# Patient Record
Sex: Male | Born: 1980
Health system: Southern US, Community
[De-identification: ages and names within clinical notes are randomized; demographics above are authoritative.]

## PROBLEM LIST (undated history)

## (undated) DIAGNOSIS — E119 Type 2 diabetes mellitus without complications: Secondary | ICD-10-CM

## (undated) DIAGNOSIS — F319 Bipolar disorder, unspecified: Secondary | ICD-10-CM

## (undated) DIAGNOSIS — F1010 Alcohol abuse, uncomplicated: Secondary | ICD-10-CM

## (undated) DIAGNOSIS — B1920 Unspecified viral hepatitis C without hepatic coma: Secondary | ICD-10-CM

## (undated) DIAGNOSIS — F1110 Opioid abuse, uncomplicated: Secondary | ICD-10-CM

## (undated) DIAGNOSIS — I10 Essential (primary) hypertension: Secondary | ICD-10-CM

## (undated) HISTORY — DX: Type 2 diabetes mellitus without complications: E11.9

## (undated) HISTORY — PX: KNEE ARTHROSCOPY: SUR90

## (undated) HISTORY — DX: Essential (primary) hypertension: I10

## (undated) HISTORY — DX: Bipolar disorder, unspecified: F31.9

---

## 2013-07-26 ENCOUNTER — Ambulatory Visit (HOSPITAL_BASED_OUTPATIENT_CLINIC_OR_DEPARTMENT_OTHER): Payer: Self-pay | Attending: Internal Medicine

## 2013-07-26 VITALS — Ht 71.0 in | Wt 319.0 lb

## 2013-07-26 DIAGNOSIS — R0609 Other forms of dyspnea: Secondary | ICD-10-CM | POA: Insufficient documentation

## 2013-07-26 DIAGNOSIS — R0989 Other specified symptoms and signs involving the circulatory and respiratory systems: Secondary | ICD-10-CM | POA: Insufficient documentation

## 2013-07-26 DIAGNOSIS — G4733 Obstructive sleep apnea (adult) (pediatric): Secondary | ICD-10-CM | POA: Insufficient documentation

## 2013-07-26 DIAGNOSIS — F40298 Other specified phobia: Secondary | ICD-10-CM | POA: Insufficient documentation

## 2013-08-01 DIAGNOSIS — G4733 Obstructive sleep apnea (adult) (pediatric): Secondary | ICD-10-CM

## 2013-08-01 NOTE — Sleep Study (Signed)
   NAME: Jason Wolf DATE OF BIRTH:  12/25/1980 MEDICAL RECORD NUMBER [id number]  LOCATION: [facility]  PHYSICIAN: Dekisha Mesmer D  DATE OF STUDY: 07/26/2013  SLEEP STUDY TYPE: Nocturnal Polysomnogram               REFERRING PHYSICIAN: Dartha LodgeSteele, Anthony, FNP  INDICATION FOR STUDY: Insomnia with sleep apnea  EPWORTH SLEEPINESS SCORE:   8/24 HEIGHT: 5\' 11"  (180.3 cm)  WEIGHT: 319 lb (144.697 kg)    Body mass index is 44.51 kg/(m^2).  NECK SIZE: 19 in.  MEDICATIONS: Charted for review  SLEEP ARCHITECTURE: Split study protocol. During the diagnostic phase total sleep time 120.5 minutes with sleep efficiency 88.9%. Stage I was 15.8%, stage II 83.4%, stage III 0.8%, REM absent. Sleep latency 12 minutes, awake after sleep onset 3.5 minutes, arousal index 5. Bedtime medication: None  RESPIRATORY DATA: Split study protocol. Apnea hypopnea index (AHI) 17.9 per hour. A total of 36 events were scored including one central apnea and 35 hypopneas. Events were more common while supine. CPAP was titrated to 10 CWP, AHI 8.6 per hour. The patient was described as extremely claustrophobic. Several masks were tried but he settled on a nasal mask. He entered REM in the last hour of sleep and had more events in that interval. Titration may be incomplete.  OXYGEN DATA: Moderately loud snoring before CPAP with oxygen desaturation to a nadir of 83% on room air. With CPAP, snoring was largely controlled and mean oxygen saturation held 93% on room air.  CARDIAC DATA: Normal cardiac rhythm  MOVEMENT/PARASOMNIA: A few incidental limb jerks were noted with little effect on sleep. No bathroom trips.  IMPRESSION/ RECOMMENDATION:   1) Moderate obstructive sleep apnea/hypopnea syndrome, AHI 17.9 per hour. Moderately loud snoring with oxygen desaturation before CPAP to a nadir of 83% on room air. 2) CPAP titration to 10 CWP, AHI 8.6 per hour. Because of claustrophobia, several masks were tried  before settling on a ResMed air fit N10 nasal mask with heated humidifier. Residual events were noted when he entered REM in the last hour of sleep. Suggest initial home trial at 11 CWP. Snoring was largely prevented with CPAP and mean oxygen saturation held 93% on room air.   Signed Jetty Duhamellinton Eugene Zeiders M.D. Waymon BudgeYOUNG,Emmelia Holdsworth D Diplomate, American Board of Sleep Medicine  ELECTRONICALLY SIGNED ON:  08/01/2013, 9:18 AM [facility] PH: [phone]   FX: [phone] ACCREDITED BY THE AMERICAN ACADEMY OF SLEEP MEDICINE

## 2014-11-10 ENCOUNTER — Emergency Department
Admit: 2014-11-10 | Disposition: A | Discharge status: Home / Self Care | Payer: PRIVATE HEALTH INSURANCE | Admitting: Emergency Medicine

## 2014-11-10 LAB — BASIC METABOLIC PANEL
Anion Gap: 9 (ref 7–16)
BUN: 11 mg/dL
CALCIUM: 9.3 mg/dL
Chloride: 102 mmol/L
Co2: 26 mmol/L
Creatinine: 0.75 mg/dL
EGFR (African American): >60
EGFR (Non-African Amer.): >60
Glucose: 78 mg/dL
POTASSIUM: 4.2 mmol/L
SODIUM: 137 mmol/L

## 2014-11-10 LAB — URINALYSIS, COMPLETE
BILIRUBIN, UR: NEGATIVE
BLOOD: NEGATIVE
Bacteria: NONE SEEN
Glucose,UR: NEGATIVE mg/dL (ref 0–75)
Ketone: NEGATIVE
Leukocyte Esterase: NEGATIVE
Nitrite: NEGATIVE
PROTEIN: NEGATIVE
Ph: 6 (ref 4.5–8.0)
Specific Gravity: 1.011 (ref 1.003–1.030)
Squamous Epithelial: NONE SEEN

## 2014-11-10 LAB — CBC
HCT: 41.5 % (ref 40.0–52.0)
HGB: 14.3 g/dL (ref 13.0–18.0)
MCH: 29.6 pg (ref 26.0–34.0)
MCHC: 34.4 g/dL (ref 32.0–36.0)
MCV: 86 fL (ref 80–100)
Platelet: 361 10*3/uL (ref 150–440)
RBC: 4.81 10*6/uL (ref 4.40–5.90)
RDW: 12.6 % (ref 11.5–14.5)
WBC: 8.3 10*3/uL (ref 3.8–10.6)

## 2014-11-10 LAB — HEMOGLOBIN A1C: Hemoglobin A1C: 6.3 % — ABNORMAL HIGH

## 2014-12-09 ENCOUNTER — Ambulatory Visit: Payer: Self-pay

## 2014-12-28 ENCOUNTER — Ambulatory Visit
Admission: RE | Admit: 2014-12-28 | Discharge: 2014-12-28 | Disposition: A | Discharge status: Home / Self Care | Payer: PRIVATE HEALTH INSURANCE | Source: Ambulatory Visit | Attending: Nurse Practitioner | Admitting: Nurse Practitioner

## 2014-12-28 DIAGNOSIS — R000 Tachycardia, unspecified: Secondary | ICD-10-CM | POA: Insufficient documentation

## 2015-01-06 ENCOUNTER — Other Ambulatory Visit: Payer: Self-pay

## 2015-02-03 ENCOUNTER — Other Ambulatory Visit: Payer: Self-pay

## 2015-03-10 ENCOUNTER — Other Ambulatory Visit: Payer: Self-pay

## 2015-03-17 ENCOUNTER — Ambulatory Visit: Payer: Self-pay

## 2015-06-30 ENCOUNTER — Other Ambulatory Visit: Payer: Self-pay

## 2015-06-30 LAB — LIPID PANEL
CHOLESTEROL: 218 mg/dL — AB (ref 0–200)
HDL: 32 mg/dL — AB (ref 35–70)
TRIGLYCERIDES: 521 mg/dL — AB (ref 40–160)

## 2015-06-30 LAB — CBC AND DIFFERENTIAL
HEMATOCRIT: 42 % (ref 41–53)
Hemoglobin: 14.3 g/dL (ref 13.5–17.5)
Neutrophils Absolute: 46 /uL
Platelets: 317 10*3/uL (ref 150–399)
WBC: 7.8 10^3/mL

## 2015-06-30 LAB — HEPATIC FUNCTION PANEL
ALT: 31 U/L (ref 10–40)
AST: 19 U/L (ref 14–40)
Alkaline Phosphatase: 67 U/L (ref 25–125)
Bilirubin, Total: 0.4 mg/dL

## 2015-06-30 LAB — BASIC METABOLIC PANEL
BUN: 6 mg/dL (ref 4–21)
CREATININE: 0.6 mg/dL (ref 0.6–1.3)
GLUCOSE: 229 mg/dL
POTASSIUM: 4.8 mmol/L (ref 3.4–5.3)
SODIUM: 134 mmol/L — AB (ref 137–147)

## 2015-06-30 LAB — HEMOGLOBIN A1C: Hemoglobin A1C: 10.1

## 2015-07-07 ENCOUNTER — Ambulatory Visit: Payer: Self-pay

## 2015-07-07 DIAGNOSIS — E119 Type 2 diabetes mellitus without complications: Secondary | ICD-10-CM | POA: Insufficient documentation

## 2015-07-07 DIAGNOSIS — I10 Essential (primary) hypertension: Secondary | ICD-10-CM | POA: Insufficient documentation

## 2015-07-07 DIAGNOSIS — E785 Hyperlipidemia, unspecified: Secondary | ICD-10-CM | POA: Insufficient documentation

## 2015-07-07 DIAGNOSIS — F319 Bipolar disorder, unspecified: Secondary | ICD-10-CM | POA: Insufficient documentation

## 2015-09-16 DIAGNOSIS — E119 Type 2 diabetes mellitus without complications: Secondary | ICD-10-CM

## 2015-09-16 DIAGNOSIS — I10 Essential (primary) hypertension: Secondary | ICD-10-CM

## 2015-09-16 DIAGNOSIS — F3170 Bipolar disorder, currently in remission, most recent episode unspecified: Secondary | ICD-10-CM

## 2015-09-16 DIAGNOSIS — E785 Hyperlipidemia, unspecified: Secondary | ICD-10-CM

## 2015-10-13 ENCOUNTER — Ambulatory Visit: Payer: Self-pay

## 2015-10-28 ENCOUNTER — Other Ambulatory Visit: Payer: Self-pay | Admitting: Internal Medicine

## 2016-01-19 ENCOUNTER — Encounter (HOSPITAL_BASED_OUTPATIENT_CLINIC_OR_DEPARTMENT_OTHER): Payer: Self-pay

## 2016-01-19 ENCOUNTER — Emergency Department (HOSPITAL_BASED_OUTPATIENT_CLINIC_OR_DEPARTMENT_OTHER)
Admission: EM | Admit: 2016-01-19 | Discharge: 2016-01-19 | Disposition: A | Discharge status: Home / Self Care | Payer: PRIVATE HEALTH INSURANCE | Attending: Emergency Medicine | Admitting: Emergency Medicine

## 2016-01-19 DIAGNOSIS — Y939 Activity, unspecified: Secondary | ICD-10-CM | POA: Insufficient documentation

## 2016-01-19 DIAGNOSIS — Z7984 Long term (current) use of oral hypoglycemic drugs: Secondary | ICD-10-CM | POA: Insufficient documentation

## 2016-01-19 DIAGNOSIS — E119 Type 2 diabetes mellitus without complications: Secondary | ICD-10-CM | POA: Insufficient documentation

## 2016-01-19 DIAGNOSIS — Y929 Unspecified place or not applicable: Secondary | ICD-10-CM | POA: Insufficient documentation

## 2016-01-19 DIAGNOSIS — Z79899 Other long term (current) drug therapy: Secondary | ICD-10-CM | POA: Insufficient documentation

## 2016-01-19 DIAGNOSIS — F319 Bipolar disorder, unspecified: Secondary | ICD-10-CM | POA: Insufficient documentation

## 2016-01-19 DIAGNOSIS — I10 Essential (primary) hypertension: Secondary | ICD-10-CM | POA: Insufficient documentation

## 2016-01-19 DIAGNOSIS — Y999 Unspecified external cause status: Secondary | ICD-10-CM | POA: Insufficient documentation

## 2016-01-19 DIAGNOSIS — S40269A Insect bite (nonvenomous) of unspecified shoulder, initial encounter: Secondary | ICD-10-CM | POA: Insufficient documentation

## 2016-01-19 DIAGNOSIS — S80869A Insect bite (nonvenomous), unspecified lower leg, initial encounter: Secondary | ICD-10-CM | POA: Insufficient documentation

## 2016-01-19 DIAGNOSIS — W57XXXA Bitten or stung by nonvenomous insect and other nonvenomous arthropods, initial encounter: Secondary | ICD-10-CM | POA: Insufficient documentation

## 2016-01-19 DIAGNOSIS — F17200 Nicotine dependence, unspecified, uncomplicated: Secondary | ICD-10-CM | POA: Insufficient documentation

## 2016-01-19 HISTORY — DX: Alcohol abuse, uncomplicated: F10.10

## 2016-01-19 HISTORY — DX: Opioid abuse, uncomplicated: F11.10

## 2016-01-19 NOTE — ED Notes (Signed)
Scattered rash x 1-2 weeks-[facility] days for ETOH and opiate abuse-NAD-steady gait

## 2016-01-19 NOTE — ED Provider Notes (Signed)
CSN: [id number]     Arrival date & time 01/19/16  1649 History   First MD Initiated Contact with Patient 01/19/16 1701     Chief Complaint  Patient presents with  . Rash     (Consider location/radiation/quality/duration/timing/severity/associated sxs/prior Treatment) Patient is a 35 y.o. male presenting with rash. The history is provided by the patient.  Rash Location:  Shoulder/arm and leg Quality: itchiness, redness and weeping   Severity:  Moderate Onset quality:  Gradual Duration:  2 weeks Timing:  Constant Progression:  Worsening Chronicity:  New Relieved by:  Nothing Worsened by:  Nothing tried Ineffective treatments:  None tried Associated symptoms: no abdominal pain, no diarrhea, no fever, no headaches, no joint pain, no myalgias, no shortness of breath and not vomiting     35 yo M With a chief complaint of a rash. Is localized to the extremities. Rash is itchy. Some areas are now more like welts with some clear discharge. These are the ones that he said were extremely itchy that he would scratch. Denies fevers or chills.  Past Medical History  Diagnosis Date  . Hypertension   . Diabetes mellitus without complication (HCC)   . Bipolar disorder (HCC)   . ETOH abuse   . Opiate abuse, episodic    Past Surgical History  Procedure Laterality Date  . Knee arthroscopy     Family History  Problem Relation Age of Onset  . Diabetes type II Mother    Social History  Substance Use Topics  . Smoking status: Current Every Day Smoker -- 1.00 packs/day  . Smokeless tobacco: None  . Alcohol Use: No     Comment: in rehab    Review of Systems  Constitutional: Negative for fever and chills.  HENT: Negative for congestion and facial swelling.   Eyes: Negative for discharge and visual disturbance.  Respiratory: Negative for shortness of breath.   Cardiovascular: Negative for chest pain and palpitations.  Gastrointestinal: Negative for vomiting, abdominal pain and diarrhea.   Musculoskeletal: Negative for myalgias and arthralgias.  Skin: Positive for rash. Negative for color change.  Neurological: Negative for tremors, syncope and headaches.  Psychiatric/Behavioral: Negative for confusion and dysphoric mood.      Allergies  Codeine  Home Medications   Prior to Admission medications   Medication Sig Start Date End Date Taking? Authorizing Provider  citalopram (CELEXA) 40 MG tablet Take 40 mg by mouth daily.    Historical Provider, MD  divalproex (DEPAKOTE) 500 MG DR tablet Take 500 mg by mouth 3 (three) times daily.    Historical Provider, MD  fenofibrate (TRICOR) 145 MG tablet TAKE ONE TABLET BY MOUTH EVERY DAY 11/01/15   Harle BattiestShannon A McGowan, PA-C  hydrochlorothiazide (MICROZIDE) 12.5 MG capsule Take 12.5 mg by mouth daily.    Historical Provider, MD  lisinopril (PRINIVIL,ZESTRIL) 20 MG tablet Take 20 mg by mouth daily.    Historical Provider, MD  metFORMIN (GLUCOPHAGE) 500 MG tablet Take by mouth 2 (two) times daily with a meal.    Historical Provider, MD  naproxen (NAPROSYN) 250 MG tablet Take by mouth as needed.    Historical Provider, MD  rosuvastatin (CRESTOR) 5 MG tablet Take 5 mg by mouth daily.    Historical Provider, MD   BP 119/81 mmHg  Pulse 99  Temp(Src) 98.7 F (37.1 C)  Resp 18  Ht 5\' 11"  (1.803 m)  Wt 308 lb (139.708 kg)  BMI 42.98 kg/m2  SpO2 99% Physical Exam  Constitutional: He is oriented to  person, place, and time. He appears well-developed and well-nourished.  HENT:  Head: Normocephalic and atraumatic.  Eyes: EOM are normal. Pupils are equal, round, and reactive to light.  Neck: Normal range of motion. Neck supple. No JVD present.  Cardiovascular: Normal rate and regular rhythm.  Exam reveals no gallop and no friction rub.   No murmur heard. Pulmonary/Chest: No respiratory distress. He has no wheezes.  Abdominal: He exhibits no distension. There is no tenderness. There is no rebound and no guarding.  Musculoskeletal: Normal  range of motion.  Neurological: He is alert and oriented to person, place, and time.  Skin: Rash noted. No pallor.  Multiple well circumscribed lesions, pruritic with excoriations. Localized to the extremities.  Psychiatric: He has a normal mood and affect. His behavior is normal.  Nursing note and vitals reviewed.   ED Course  Procedures (including critical care time) Labs Review Labs Reviewed - No data to display  Imaging Review No results found. I have personally reviewed and evaluated these images and lab results as part of my medical decision-making.   EKG Interpretation None      MDM   Final diagnoses:  Insect bite    35 yo M with a chief complaint of an itchy rash. Appear to be insect bites based on location.We'll have him use Benadryl as needed for itching. PCP follow-up.  5:20 PM:  I have discussed the diagnosis/risks/treatment options with the patient and believe the pt to be eligible for discharge home to follow-up with PCP. We also discussed returning to the ED immediately if new or worsening sx occur. We discussed the sx which are most concerning (e.g., sudden worsening pain, fever, inability to tolerate by mouth) that necessitate immediate return. Medications administered to the patient during their visit and any new prescriptions provided to the patient are listed below.  Medications given during this visit Medications - No data to display  New Prescriptions   No medications on file    The patient appears reasonably screen and/or stabilized for discharge and I doubt any other medical condition or other [facility] requiring further screening, evaluation, or treatment in the ED at this time prior to discharge.      Melene Planan Gorge Almanza, DO 01/19/16 1720

## 2016-02-05 ENCOUNTER — Encounter (HOSPITAL_COMMUNITY): Payer: Self-pay | Admitting: *Deleted

## 2016-02-05 ENCOUNTER — Emergency Department (HOSPITAL_COMMUNITY)
Admission: EM | Admit: 2016-02-05 | Discharge: 2016-02-06 | Disposition: A | Discharge status: Home / Self Care | Payer: Self-pay | Attending: Emergency Medicine | Admitting: Emergency Medicine

## 2016-02-05 DIAGNOSIS — F3181 Bipolar II disorder: Secondary | ICD-10-CM | POA: Insufficient documentation

## 2016-02-05 DIAGNOSIS — Z7984 Long term (current) use of oral hypoglycemic drugs: Secondary | ICD-10-CM | POA: Insufficient documentation

## 2016-02-05 DIAGNOSIS — R45851 Suicidal ideations: Secondary | ICD-10-CM

## 2016-02-05 DIAGNOSIS — Z791 Long term (current) use of non-steroidal anti-inflammatories (NSAID): Secondary | ICD-10-CM | POA: Insufficient documentation

## 2016-02-05 DIAGNOSIS — F17200 Nicotine dependence, unspecified, uncomplicated: Secondary | ICD-10-CM | POA: Insufficient documentation

## 2016-02-05 DIAGNOSIS — E119 Type 2 diabetes mellitus without complications: Secondary | ICD-10-CM | POA: Insufficient documentation

## 2016-02-05 DIAGNOSIS — I10 Essential (primary) hypertension: Secondary | ICD-10-CM | POA: Insufficient documentation

## 2016-02-05 DIAGNOSIS — F1110 Opioid abuse, uncomplicated: Secondary | ICD-10-CM | POA: Insufficient documentation

## 2016-02-05 DIAGNOSIS — Z79899 Other long term (current) drug therapy: Secondary | ICD-10-CM | POA: Insufficient documentation

## 2016-02-05 LAB — COMPREHENSIVE METABOLIC PANEL
ALBUMIN: 4.2 g/dL (ref 3.5–5.0)
ALT: 36 U/L (ref 17–63)
ANION GAP: 9 (ref 5–15)
AST: 25 U/L (ref 15–41)
Alkaline Phosphatase: 45 U/L (ref 38–126)
BUN: 10 mg/dL (ref 6–20)
CHLORIDE: 103 mmol/L (ref 101–111)
CO2: 24 mmol/L (ref 22–32)
Calcium: 9 mg/dL (ref 8.9–10.3)
Creatinine, Ser: 0.7 mg/dL (ref 0.61–1.24)
GFR calc non Af Amer: >60 mL/min (ref >60)
GLUCOSE: 151 mg/dL — AB (ref 65–99)
POTASSIUM: 4.1 mmol/L (ref 3.5–5.1)
SODIUM: 136 mmol/L (ref 135–145)
Total Bilirubin: 0.8 mg/dL (ref 0.3–1.2)
Total Protein: 7 g/dL (ref 6.5–8.1)

## 2016-02-05 LAB — CBC
HCT: 36.9 % — ABNORMAL LOW (ref 39.0–52.0)
HEMOGLOBIN: 13.2 g/dL (ref 13.0–17.0)
MCH: 30.7 pg (ref 26.0–34.0)
MCHC: 35.8 g/dL (ref 30.0–36.0)
MCV: 85.8 fL (ref 78.0–100.0)
Platelets: 363 10*3/uL (ref 150–400)
RBC: 4.3 MIL/uL (ref 4.22–5.81)
RDW: 12.1 % (ref 11.5–15.5)
WBC: 6.1 10*3/uL (ref 4.0–10.5)

## 2016-02-05 LAB — RAPID URINE DRUG SCREEN, HOSP PERFORMED
AMPHETAMINES: NOT DETECTED
BENZODIAZEPINES: NOT DETECTED
Barbiturates: NOT DETECTED
COCAINE: NOT DETECTED
OPIATES: NOT DETECTED
TETRAHYDROCANNABINOL: NOT DETECTED

## 2016-02-05 LAB — SALICYLATE LEVEL

## 2016-02-05 LAB — ACETAMINOPHEN LEVEL

## 2016-02-05 LAB — ETHANOL: Alcohol, Ethyl (B): <5 mg/dL (ref <5)

## 2016-02-05 MED ORDER — FENOFIBRATE 54 MG PO TABS
54.0000 mg | ORAL_TABLET | Freq: Every day | ORAL | Status: DC
  Administered 2016-02-05 – 2016-02-06 (×2): 54 mg via ORAL
  Filled 2016-02-05 (×2): qty 1

## 2016-02-05 MED ORDER — METFORMIN HCL 500 MG PO TABS
1000.0000 mg | ORAL_TABLET | Freq: Two times a day (BID) | ORAL | Status: DC
  Administered 2016-02-05 – 2016-02-06 (×2): 1000 mg via ORAL
  Filled 2016-02-05 (×3): qty 2

## 2016-02-05 MED ORDER — LISINOPRIL 40 MG PO TABS
40.0000 mg | ORAL_TABLET | Freq: Every day | ORAL | Status: DC
  Administered 2016-02-05 – 2016-02-06 (×2): 40 mg via ORAL
  Filled 2016-02-05 (×2): qty 1

## 2016-02-05 MED ORDER — DIVALPROEX SODIUM 500 MG PO DR TAB
1000.0000 mg | DELAYED_RELEASE_TABLET | Freq: Two times a day (BID) | ORAL | Status: DC
  Administered 2016-02-05 – 2016-02-06 (×2): 1000 mg via ORAL
  Filled 2016-02-05 (×2): qty 2

## 2016-02-05 MED ORDER — DIPHENHYDRAMINE HCL 25 MG PO CAPS: 50.0000 mg | ORAL_CAPSULE | Freq: Four times a day (QID) | ORAL | Status: DC | PRN

## 2016-02-05 MED ORDER — NAPROXEN 500 MG PO TABS
250.0000 mg | ORAL_TABLET | Freq: Two times a day (BID) | ORAL | Status: DC | PRN
  Administered 2016-02-05: 250 mg via ORAL
  Filled 2016-02-05: qty 1

## 2016-02-05 MED ORDER — HYDROCHLOROTHIAZIDE 12.5 MG PO CAPS
12.5000 mg | ORAL_CAPSULE | Freq: Every day | ORAL | Status: DC
  Administered 2016-02-05 – 2016-02-06 (×2): 12.5 mg via ORAL
  Filled 2016-02-05 (×2): qty 1

## 2016-02-05 MED ORDER — CITALOPRAM HYDROBROMIDE 10 MG PO TABS
40.0000 mg | ORAL_TABLET | Freq: Every day | ORAL | Status: DC
  Administered 2016-02-05: 40 mg via ORAL
  Filled 2016-02-05: qty 4
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
## 2016-02-05 NOTE — ED Notes (Signed)
Per pt report: pt is coming from [facility] in a drug recovery program from opioids, benzos, OTC cough medicine.  Pt states he has borderline personality disorder and got upset last night and therefore attempted to hang himself.  At this time, pt still reports SI with no plan.  Pt denies any pain or other symptoms.

## 2016-02-05 NOTE — ED Notes (Signed)
Patient noted sleeping in room. No complaints, stable, in no acute distress. Q15 minute rounds and monitoring via Security Cameras to continue.  

## 2016-02-05 NOTE — ED Notes (Signed)
Report from Janie Rambo RN. Patient sleeping, respirations regular and unlabored. Q15 minute rounds and security camera observation to continue.   

## 2016-02-05 NOTE — ED Notes (Signed)
Up to the bathroom 

## 2016-02-05 NOTE — ED Notes (Signed)
Visitor into see 
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
## 2016-02-05 NOTE — ED Notes (Signed)
Pt ambulatory w/o difficulty from TCU 

## 2016-02-05 NOTE — ED Notes (Signed)
[facility] Expected date:  Expected time:  Means of arrival:  Comments: Hold 29

## 2016-02-05 NOTE — Progress Notes (Signed)
Disposition CSW completed patient referrals to the following inpatient psych facilities:  [facility]  CSW will continue to follow patient for placement needs.  Seward SpeckLeo Josephanthony Wolf [facility] CenterCSW,LCAS Behavioral Health Disposition CSW [phone]

## 2016-02-06 DIAGNOSIS — F3181 Bipolar II disorder: Secondary | ICD-10-CM | POA: Diagnosis present

## 2016-02-06 NOTE — ED Notes (Signed)
Patient noted sleeping in room. No complaints, stable, in no acute distress. Q15 minute rounds and monitoring via Security Cameras to continue.  

## 2016-02-06 NOTE — BH Assessment (Signed)
BHH Assessment Progress Note  Per Thedore MinsMojeed Akintayo, MD, this pt does not require psychiatric hospitalization at this time.  Pt reports that [facility], is [facility] for him.  At 11:12 I called the facility at [phone] and spoke to [facility], who confirms that this it true, and agrees to have someone come to the ED to pick the pt up.  Dr Jannifer FranklinAkintayo concurs with this plan.  Pt's nurse, Kendal Hymendie, has been notified.  Arrival of [facility] representative is pending as of this writing.  Jason Canninghomas Alaisha Eversley, MA Triage Specialist [phone]

## 2016-02-06 NOTE — ED Notes (Signed)
[facility]  All belongings were returned to patient.  Pt was safe and denied suicidal ideations.
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
## 2016-02-06 NOTE — BHH Suicide Risk Assessment (Signed)
Suicide Risk Assessment  Discharge Assessment   [facility]   Principal Problem: Bipolar 2 disorder, major depressive episode [facility](HCC) Discharge Diagnoses:  Patient Active Problem List   Diagnosis Date Noted  . Bipolar 2 disorder, major depressive episode ([facility]) [F31.81] 02/06/2016    Priority: High  . Diabetes ([facility]) [E11.9] 07/07/2015  . Bipolar disorder ([facility]) [F31.9] 07/07/2015  . Essential hypertension [I10] 07/07/2015  . Hyperlipemia [E78.5] 07/07/2015    Total Time spent with patient: 45 minutes  Musculoskeletal: Strength & Muscle Tone: within normal limits Gait & Station: normal Patient leans: N/A  Psychiatric Specialty Exam: Physical Exam  Constitutional: He is oriented to person, place, and time. He appears well-developed and well-nourished.  HENT:  Head: Normocephalic.  Neck: Normal range of motion.  Respiratory: Effort normal.  Musculoskeletal: Normal range of motion.  Neurological: He is alert and oriented to person, place, and time.  Skin: Skin is warm and dry.  Psychiatric: He has a normal mood and affect. His speech is normal and behavior is normal. Judgment and thought content normal. Cognition and memory are normal.    Review of Systems  Constitutional: Negative.   HENT: Negative.   Eyes: Negative.   Respiratory: Negative.   Cardiovascular: Negative.   Gastrointestinal: Negative.   Genitourinary: Negative.   Musculoskeletal: Negative.   Skin: Negative.   Neurological: Negative.   Endo/Heme/Allergies: Negative.   Psychiatric/Behavioral: Positive for substance abuse.    Blood pressure 109/73, pulse 71, temperature 97.9 F (36.6 C), temperature source Oral, resp. rate 16, height 5\' 11"  (1.803 m), weight 139.708 kg (308 lb), SpO2 96 %.Body mass index is 42.98 kg/(m^2).  General Appearance: Casual  Eye Contact:  Good  Speech:  Normal Rate  Volume:  Normal  Mood:  Euthymic  Affect:  Congruent  Thought Process:  Coherent and  Descriptions of Associations: Intact  Orientation:  Full (Time, Place, and Person)  Thought Content:  WDL  Suicidal Thoughts:  No  Homicidal Thoughts:  No  Memory:  Immediate;   Good Recent;   Good Remote;   Good  Judgement:  Fair  Insight:  Fair  Psychomotor Activity:  Normal  Concentration:  Concentration: Good and Attention Span: Good  Recall:  Good  Fund of Knowledge:  Good  Language:  Good  Akathisia:  No  Handed:  Right  AIMS (if indicated):     Assets:  Housing Leisure Time Physical Health Resilience Social Support  ADL's:  Intact  Cognition:  WNL  Sleep:      Mental Status Per Nursing Assessment::   On Admission:   suicide threat  Demographic Factors:  Male and Caucasian  Loss Factors: NA  Historical Factors: NA  Risk Reduction Factors:   Sense of responsibility to family, Living with another person, especially a relative, Positive social support and Positive therapeutic relationship  Continued Clinical Symptoms:  Substance abuse  Cognitive Features That Contribute To Risk:  None    Suicide Risk:  Minimal: No identifiable suicidal ideation.  Patients presenting with no risk factors but with morbid ruminations; may be classified as minimal risk based on the severity of the depressive symptoms    Plan Of Care/Follow-up recommendations:  Activity:  as tolerated  Diet:  heart healthy diet  Amauri Keefe, NP 02/06/2016, 11:28 AM

## 2016-02-06 NOTE — Discharge Instructions (Signed)
For your ongoing behavioral health needs, you are advised to continue treatment at [facility]:       [facility]      [address]      [place], [place] [address]      [phone]

## 2016-04-18 ENCOUNTER — Other Ambulatory Visit: Payer: Self-pay | Admitting: Internal Medicine

## 2019-07-27 ENCOUNTER — Other Ambulatory Visit: Payer: Self-pay

## 2019-07-27 ENCOUNTER — Encounter (HOSPITAL_BASED_OUTPATIENT_CLINIC_OR_DEPARTMENT_OTHER): Payer: Self-pay | Admitting: *Deleted

## 2019-07-27 ENCOUNTER — Emergency Department (HOSPITAL_BASED_OUTPATIENT_CLINIC_OR_DEPARTMENT_OTHER)
Admission: EM | Admit: 2019-07-27 | Discharge: 2019-07-27 | Disposition: A | Discharge status: Home / Self Care | Payer: PRIVATE HEALTH INSURANCE | Attending: Emergency Medicine | Admitting: Emergency Medicine

## 2019-07-27 DIAGNOSIS — Z79899 Other long term (current) drug therapy: Secondary | ICD-10-CM | POA: Insufficient documentation

## 2019-07-27 DIAGNOSIS — F17210 Nicotine dependence, cigarettes, uncomplicated: Secondary | ICD-10-CM | POA: Insufficient documentation

## 2019-07-27 DIAGNOSIS — I10 Essential (primary) hypertension: Secondary | ICD-10-CM | POA: Insufficient documentation

## 2019-07-27 DIAGNOSIS — E119 Type 2 diabetes mellitus without complications: Secondary | ICD-10-CM | POA: Insufficient documentation

## 2019-07-27 DIAGNOSIS — K0889 Other specified disorders of teeth and supporting structures: Secondary | ICD-10-CM | POA: Insufficient documentation

## 2019-07-27 DIAGNOSIS — Z885 Allergy status to narcotic agent status: Secondary | ICD-10-CM | POA: Insufficient documentation

## 2019-07-27 DIAGNOSIS — Z7984 Long term (current) use of oral hypoglycemic drugs: Secondary | ICD-10-CM | POA: Insufficient documentation

## 2019-07-27 MED ORDER — PENICILLIN V POTASSIUM 500 MG PO TABS
500.0000 mg | ORAL_TABLET | Freq: Four times a day (QID) | ORAL | 0 refills | Status: AC
Start: 2019-07-27 — End: 2019-08-03

## 2019-07-27 MED ORDER — NAPROXEN 500 MG PO TABS: 500.0000 mg | ORAL_TABLET | Freq: Two times a day (BID) | ORAL | 0 refills | Status: DC

## 2019-07-27 NOTE — Discharge Instructions (Signed)
Take Penicillin 4 times daily for one week. Take with food Take Naproxen twice daily for one week for pain and inflammation Please follow up with a dentist when possible

## 2019-07-27 NOTE — ED Triage Notes (Signed)
right up dental pain x 1 week.

## 2019-07-27 NOTE — ED Notes (Signed)
Pt. Reports the tooth has been broken and started to bother him approx. A week ago now.  Pt. Reports he has no dentist.  Pt. Is in pain with reports of soreness around the tooth.  A little jaw swelling not much on the R side.

## 2019-07-27 NOTE — ED Provider Notes (Signed)
[facility] EMERGENCY DEPARTMENT Provider Note   CSN: [id number] Arrival date & time: 07/27/19  1525   History Chief Complaint  Patient presents with  . Dental Pain    Jason Wolf is a 38 y.o. male who presents with dental pain.  States that he has been having right upper dental pain for approximately 1-1/2 weeks.  He reports the pain radiates to the right forehead and he has associated swelling of the cheek. He has not had pain in this tooth before. He has previously had to have all of his teeth pulled and has dentures however his 2 top wisdom teeth came in after that and now he cannot wear the dentures.  This was done in [place] at a free clinic. He reports hx of opiate abuse and states he's in recovery. He's been taking Ibuprofen without relief. He does not have a dentist at this time. He denies fever or oral swelling  HPI   Past Medical History:  Diagnosis Date  . Bipolar disorder ([place])   . Diabetes mellitus without complication ([place])   . ETOH abuse   . Hypertension   . Opiate abuse, episodic [facility])     Patient Active Problem List   Diagnosis Date Noted  . Bipolar 2 disorder, major depressive episode ([place]) 02/06/2016  . Diabetes ([place]) 07/07/2015  . Bipolar disorder ([place]) 07/07/2015  . Essential hypertension 07/07/2015  . Hyperlipemia 07/07/2015    Past Surgical History:  Procedure Laterality Date  . KNEE ARTHROSCOPY         Family History  Problem Relation Age of Onset  . Diabetes type II Mother     Social History   Tobacco Use  . Smoking status: Current Every Day Smoker    Packs/day: 1.00  Substance Use Topics  . Alcohol use: No    Comment: in rehab  . Drug use: No    Comment: in rehab-opiate abuse    Home Medications Prior to Admission medications   Medication Sig Start Date End Date Taking? Authorizing Provider  citalopram (CELEXA) 40 MG tablet Take 40 mg by mouth at bedtime.     [provider]  diphenhydrAMINE (BENADRYL) 25  mg capsule Take 50 mg by mouth every 6 (six) hours as needed for itching, allergies or sleep.    [provider]  divalproex (DEPAKOTE) 500 MG DR tablet Take 1,000 mg by mouth 2 (two) times daily.     [provider]  fenofibrate (TRICOR) 145 MG tablet TAKE ONE TABLET BY MOUTH EVERY DAY 11/01/15   McGowan, Larene [place] A, PA-C  hydrochlorothiazide (MICROZIDE) 12.5 MG capsule Take 12.5 mg by mouth daily. Reported on 02/05/2016    [provider]  lisinopril (PRINIVIL,ZESTRIL) 40 MG tablet Take 40 mg by mouth daily.    [provider]  metFORMIN (GLUCOPHAGE) 1000 MG tablet Take 1,000 mg by mouth 2 (two) times daily with a meal.    [provider]  naproxen (NAPROSYN) 250 MG tablet Take 250 mg by mouth 2 (two) times daily as needed for moderate pain or headache.     [provider]    Allergies    Codeine  Review of Systems   Review of Systems  Constitutional: Negative for fever.  HENT: Positive for dental problem and facial swelling.     Physical Exam Updated Vital Signs BP (!) 155/91 (BP Location: Right Arm)   Pulse (!) 111   Temp 98.4 F (36.9 C) (Oral)   Resp 18   Ht  5\' 11"  (1.803 m)   Wt 124.7 kg   SpO2 98%   BMI 38.35 kg/m   Physical Exam Vitals and nursing note reviewed.  Constitutional:      General: He is not in acute distress.    Appearance: He is well-developed. He is obese. He is not ill-appearing.  HENT:     Head: Normocephalic and atraumatic.     Mouth/Throat:     Comments: 2 top wisdom teeth are significantly decayed. The rest of his teeth have been pulled Eyes:     General: No scleral icterus.       Right eye: No discharge.        Left eye: No discharge.     Conjunctiva/sclera: Conjunctivae normal.     Pupils: Pupils are equal, round, and reactive to light.  Cardiovascular:     Rate and Rhythm: Normal rate.  Pulmonary:     Effort: Pulmonary effort is normal. No respiratory distress.  Abdominal:     General:  There is no distension.  Musculoskeletal:     Cervical back: Normal range of motion.  Skin:    General: Skin is warm and dry.  Neurological:     Mental Status: He is alert and oriented to person, place, and time.  Psychiatric:        Behavior: Behavior normal.     ED Results / Procedures / Treatments   Labs (all labs ordered are listed, but only abnormal results are displayed) Labs Reviewed - No data to display  EKG None  Radiology No results found.  Procedures Procedures (including critical care time)  Medications Ordered in ED Medications - No data to display  ED Course  I have reviewed the triage vital signs and the nursing notes.  Pertinent labs & imaging results that were available during my care of the patient were reviewed by me and considered in my medical decision making (see chart for details).  Pt presents with dental pain. Patient is afebrile, non toxic appearing, and swallowing secretions well. No concerning findings on exam. No obvious drainable abscess and doubt deep space head or neck infection. Will tx with antibiotics and NSAIDs. I gave patient referral to dentist and stressed the importance of dental follow up for ultimate management of dental pain. Discussed findings, treatment, and follow up  with patient.  Pt given return precautions.  Pt verbalizes understanding and agrees with plan.   MDM Rules/Calculators/A&P                       Final Clinical Impression(s) / ED Diagnoses Final diagnoses:  Pain, dental    Rx / DC Orders ED Discharge Orders    None       , PA-C 07/27/19 1800    07/29/19, MD 07/29/19 [phone]

## 2019-11-21 ENCOUNTER — Emergency Department (HOSPITAL_BASED_OUTPATIENT_CLINIC_OR_DEPARTMENT_OTHER)
Admission: EM | Admit: 2019-11-21 | Discharge: 2019-11-21 | Disposition: A | Discharge status: Home / Self Care | Payer: PRIVATE HEALTH INSURANCE | Attending: Emergency Medicine | Admitting: Emergency Medicine

## 2019-11-21 ENCOUNTER — Encounter (HOSPITAL_BASED_OUTPATIENT_CLINIC_OR_DEPARTMENT_OTHER): Payer: Self-pay | Admitting: Emergency Medicine

## 2019-11-21 ENCOUNTER — Other Ambulatory Visit: Payer: Self-pay

## 2019-11-21 DIAGNOSIS — E119 Type 2 diabetes mellitus without complications: Secondary | ICD-10-CM | POA: Insufficient documentation

## 2019-11-21 DIAGNOSIS — K0889 Other specified disorders of teeth and supporting structures: Secondary | ICD-10-CM | POA: Insufficient documentation

## 2019-11-21 DIAGNOSIS — I10 Essential (primary) hypertension: Secondary | ICD-10-CM | POA: Insufficient documentation

## 2019-11-21 DIAGNOSIS — Z79899 Other long term (current) drug therapy: Secondary | ICD-10-CM | POA: Insufficient documentation

## 2019-11-21 DIAGNOSIS — K029 Dental caries, unspecified: Secondary | ICD-10-CM | POA: Insufficient documentation

## 2019-11-21 DIAGNOSIS — Z7984 Long term (current) use of oral hypoglycemic drugs: Secondary | ICD-10-CM | POA: Insufficient documentation

## 2019-11-21 DIAGNOSIS — F17210 Nicotine dependence, cigarettes, uncomplicated: Secondary | ICD-10-CM | POA: Insufficient documentation

## 2019-11-21 MED ORDER — PENICILLIN V POTASSIUM 500 MG PO TABS: 500.0000 mg | ORAL_TABLET | Freq: Four times a day (QID) | ORAL | 0 refills | Status: AC

## 2019-11-21 NOTE — Discharge Instructions (Addendum)
You were given a prescription for antibiotics. Please take the antibiotic prescription fully.   Please follow-up with a dentist in the next 5 to 7 days for reevaluation.  If you do not have a dentist, resources were provided for dentist in the area in your discharge summary.  Please contact one of the offices that are listed and make an appointment for follow-up.  Please return to the emergency department for any new or worsening symptoms.  

## 2019-11-21 NOTE — ED Provider Notes (Signed)
[facility] EMERGENCY DEPARTMENT Provider Note   CSN: [id number] Arrival date & time: 11/21/19  1054     History Chief Complaint  Patient presents with  . Dental Pain    Jason Wolf is a 39 y.o. male.  HPI   Patient is a 39 year old male with a history of bipolar disorder, diabetes, EtOH abuse, hypertension, who presents the emergency department today complaining of left upper dental pain.  States he has pain for about 1.5 weeks.  It is constant and severe in nature.  He has tried taking ibuprofen at home without significant relief.  Denies any fevers facial swelling or other symptoms.  States he does have an appoint with a dentist coming up.  Past Medical History:  Diagnosis Date  . Bipolar disorder (HCC)   . Diabetes mellitus without complication (HCC)   . ETOH abuse   . Hypertension   . Opiate abuse, episodic [facility])     Patient Active Problem List   Diagnosis Date Noted  . Bipolar 2 disorder, major depressive episode (HCC) 02/06/2016  . Diabetes (HCC) 07/07/2015  . Bipolar disorder (HCC) 07/07/2015  . Essential hypertension 07/07/2015  . Hyperlipemia 07/07/2015    Past Surgical History:  Procedure Laterality Date  . KNEE ARTHROSCOPY         Family History  Problem Relation Age of Onset  . Diabetes type II Mother     Social History   Tobacco Use  . Smoking status: Current Every Day Smoker    Packs/day: 1.00  . Smokeless tobacco: Never Used  Substance Use Topics  . Alcohol use: No    Comment: in rehab  . Drug use: No    Comment: in rehab-opiate abuse    Home Medications Prior to Admission medications   Medication Sig Start Date End Date Taking? Authorizing Provider  citalopram (CELEXA) 40 MG tablet Take 40 mg by mouth at bedtime.     [provider]  diphenhydrAMINE (BENADRYL) 25 mg capsule Take 50 mg by mouth every 6 (six) hours as needed for itching, allergies or sleep.    [provider]  divalproex (DEPAKOTE) 500 MG  DR tablet Take 1,000 mg by mouth 2 (two) times daily.     [provider]  fenofibrate (TRICOR) 145 MG tablet TAKE ONE TABLET BY MOUTH EVERY DAY 11/01/15   McGowan, Carollee Herter A, PA-C  hydrochlorothiazide (MICROZIDE) 12.5 MG capsule Take 12.5 mg by mouth daily. Reported on 02/05/2016    [provider]  lisinopril (PRINIVIL,ZESTRIL) 40 MG tablet Take 40 mg by mouth daily.    [provider]  metFORMIN (GLUCOPHAGE) 1000 MG tablet Take 1,000 mg by mouth 2 (two) times daily with a meal.    [provider]  naproxen (NAPROSYN) 250 MG tablet Take 250 mg by mouth 2 (two) times daily as needed for moderate pain or headache.     [provider]  naproxen (NAPROSYN) 500 MG tablet Take 1 tablet (500 mg total) by mouth 2 (two) times daily. 07/27/19   Bethel Born, PA-C  penicillin v potassium (VEETID) 500 MG tablet Take 1 tablet (500 mg total) by mouth 4 (four) times daily for 7 days. 11/21/19 11/28/19  Semaj Coburn S, PA-C    Allergies    Codeine  Review of Systems   Review of Systems  Constitutional: Negative for fever.  HENT: Positive for dental problem. Negative for facial swelling.     Physical Exam Updated Vital Signs BP (!) 143/99 (BP Location: Left  Arm)   Pulse 97   Temp 98.4 F (36.9 C) (Oral)   Resp 18   Ht 5\' 11"  (1.803 m)   Wt 131.5 kg   SpO2 99%   BMI 40.45 kg/m   Physical Exam Constitutional:      General: He is not in acute distress.    Appearance: He is well-developed.  HENT:     Mouth/Throat:     Comments: Multiple missing teeth, left upper molar is ttp and fractured with obvious dental carie. No periapical abscess. No sublingual or submandibular swelling. No trismus.  Eyes:     Conjunctiva/sclera: Conjunctivae normal.  Cardiovascular:     Rate and Rhythm: Normal rate and regular rhythm.  Pulmonary:     Effort: Pulmonary effort is normal.     Breath sounds: Normal breath sounds.  Skin:    General: Skin is warm and dry.    Neurological:     Mental Status: He is alert and oriented to person, place, and time.     ED Results / Procedures / Treatments   Labs (all labs ordered are listed, but only abnormal results are displayed) Labs Reviewed - No data to display  EKG None  Radiology No results found.  Procedures Procedures (including critical care time)  Medications Ordered in ED Medications - No data to display  ED Course  I have reviewed the triage vital signs and the nursing notes.  Pertinent labs & imaging results that were available during my care of the patient were reviewed by me and considered in my medical decision making (see chart for details).    MDM Rules/Calculators/A&P                     Patient with toothache.  No gross abscess.  Exam unconcerning for Ludwig's angina or spread of infection.  Will treat with penicillin and pain medicine.  Urged patient to follow-up with dentist.    Final Clinical Impression(s) / ED Diagnoses Final diagnoses:  Pain, dental    Rx / DC Orders ED Discharge Orders         Ordered    penicillin v potassium (VEETID) 500 MG tablet  4 times daily     11/21/19 [address] Domenik Trice S, PA-C 11/21/19 1129    Gareth Morgan, MD 11/21/19 2251

## 2019-11-21 NOTE — ED Triage Notes (Signed)
L upper dental pain x 1 week.  

## 2020-02-19 ENCOUNTER — Encounter (HOSPITAL_BASED_OUTPATIENT_CLINIC_OR_DEPARTMENT_OTHER): Payer: Self-pay

## 2020-02-19 ENCOUNTER — Other Ambulatory Visit: Payer: Self-pay

## 2020-02-19 ENCOUNTER — Emergency Department (HOSPITAL_BASED_OUTPATIENT_CLINIC_OR_DEPARTMENT_OTHER): Payer: Self-pay

## 2020-02-19 ENCOUNTER — Inpatient Hospital Stay (HOSPITAL_BASED_OUTPATIENT_CLINIC_OR_DEPARTMENT_OTHER)
Admission: EM | Admit: 2020-02-19 | Discharge: 2020-02-23 | DRG: 982 | Disposition: A | Discharge status: Home / Self Care | Payer: Self-pay | Attending: Internal Medicine | Admitting: Internal Medicine

## 2020-02-19 DIAGNOSIS — L02413 Cutaneous abscess of right upper limb: Principal | ICD-10-CM | POA: Diagnosis present

## 2020-02-19 DIAGNOSIS — Z885 Allergy status to narcotic agent status: Secondary | ICD-10-CM

## 2020-02-19 DIAGNOSIS — Z833 Family history of diabetes mellitus: Secondary | ICD-10-CM

## 2020-02-19 DIAGNOSIS — T383X6A Underdosing of insulin and oral hypoglycemic [antidiabetic] drugs, initial encounter: Secondary | ICD-10-CM | POA: Diagnosis present

## 2020-02-19 DIAGNOSIS — Z91128 Patient's intentional underdosing of medication regimen for other reason: Secondary | ICD-10-CM

## 2020-02-19 DIAGNOSIS — Y929 Unspecified place or not applicable: Secondary | ICD-10-CM

## 2020-02-19 DIAGNOSIS — E119 Type 2 diabetes mellitus without complications: Secondary | ICD-10-CM

## 2020-02-19 DIAGNOSIS — Z6835 Body mass index (BMI) 35.0-35.9, adult: Secondary | ICD-10-CM

## 2020-02-19 DIAGNOSIS — F1510 Other stimulant abuse, uncomplicated: Secondary | ICD-10-CM | POA: Diagnosis present

## 2020-02-19 DIAGNOSIS — B951 Streptococcus, group B, as the cause of diseases classified elsewhere: Secondary | ICD-10-CM | POA: Diagnosis present

## 2020-02-19 DIAGNOSIS — F3181 Bipolar II disorder: Secondary | ICD-10-CM | POA: Diagnosis present

## 2020-02-19 DIAGNOSIS — I10 Essential (primary) hypertension: Secondary | ICD-10-CM | POA: Diagnosis present

## 2020-02-19 DIAGNOSIS — F17210 Nicotine dependence, cigarettes, uncomplicated: Secondary | ICD-10-CM | POA: Diagnosis present

## 2020-02-19 DIAGNOSIS — Z79899 Other long term (current) drug therapy: Secondary | ICD-10-CM

## 2020-02-19 DIAGNOSIS — E1165 Type 2 diabetes mellitus with hyperglycemia: Secondary | ICD-10-CM | POA: Diagnosis present

## 2020-02-19 DIAGNOSIS — L03113 Cellulitis of right upper limb: Secondary | ICD-10-CM | POA: Diagnosis present

## 2020-02-19 DIAGNOSIS — F1910 Other psychoactive substance abuse, uncomplicated: Secondary | ICD-10-CM | POA: Diagnosis present

## 2020-02-19 DIAGNOSIS — F1990 Other psychoactive substance use, unspecified, uncomplicated: Secondary | ICD-10-CM | POA: Diagnosis present

## 2020-02-19 DIAGNOSIS — Z20822 Contact with and (suspected) exposure to covid-19: Secondary | ICD-10-CM | POA: Diagnosis present

## 2020-02-19 DIAGNOSIS — M65141 Other infective (teno)synovitis, right hand: Secondary | ICD-10-CM | POA: Diagnosis present

## 2020-02-19 LAB — CBC WITH DIFFERENTIAL/PLATELET
Abs Immature Granulocytes: 0.07 10*3/uL (ref 0.00–0.07)
Basophils Absolute: 0.1 10*3/uL (ref 0.0–0.1)
Basophils Relative: 0 %
Eosinophils Absolute: 0.3 10*3/uL (ref 0.0–0.5)
Eosinophils Relative: 2 %
HCT: 39.1 % (ref 39.0–52.0)
Hemoglobin: 14 g/dL (ref 13.0–17.0)
Immature Granulocytes: 1 %
Lymphocytes Relative: 14 %
Lymphs Abs: 2.2 10*3/uL (ref 0.7–4.0)
MCH: 30 pg (ref 26.0–34.0)
MCHC: 35.8 g/dL (ref 30.0–36.0)
MCV: 83.9 fL (ref 80.0–100.0)
Monocytes Absolute: 1 10*3/uL (ref 0.1–1.0)
Monocytes Relative: 6 %
Neutro Abs: 11.9 10*3/uL — ABNORMAL HIGH (ref 1.7–7.7)
Neutrophils Relative %: 77 %
Platelets: 335 10*3/uL (ref 150–400)
RBC: 4.66 MIL/uL (ref 4.22–5.81)
RDW: 11.6 % (ref 11.5–15.5)
WBC: 15.5 10*3/uL — ABNORMAL HIGH (ref 4.0–10.5)
nRBC: 0 % (ref 0.0–0.2)

## 2020-02-19 LAB — COMPREHENSIVE METABOLIC PANEL
ALT: 25 U/L (ref 0–44)
AST: 18 U/L (ref 15–41)
Albumin: 3.7 g/dL (ref 3.5–5.0)
Alkaline Phosphatase: 77 U/L (ref 38–126)
Anion gap: 12 (ref 5–15)
BUN: 10 mg/dL (ref 6–20)
CO2: 20 mmol/L — ABNORMAL LOW (ref 22–32)
Calcium: 8.6 mg/dL — ABNORMAL LOW (ref 8.9–10.3)
Chloride: 98 mmol/L (ref 98–111)
Creatinine, Ser: 0.5 mg/dL — ABNORMAL LOW (ref 0.61–1.24)
GFR calc Af Amer: >60 mL/min (ref >60)
GFR calc non Af Amer: >60 mL/min (ref >60)
Glucose, Bld: 333 mg/dL — ABNORMAL HIGH (ref 70–99)
Potassium: 3.9 mmol/L (ref 3.5–5.1)
Sodium: 130 mmol/L — ABNORMAL LOW (ref 135–145)
Total Bilirubin: 0.7 mg/dL (ref 0.3–1.2)
Total Protein: 6.7 g/dL (ref 6.5–8.1)

## 2020-02-19 LAB — LACTIC ACID, PLASMA: Lactic Acid, Venous: 0.9 mmol/L (ref 0.5–1.9)

## 2020-02-19 IMAGING — DX DG CHEST 2V
2 series · 2 of 2 positions shown · non-contrast
Comparison: [DATE]

CLINICAL DATA: Infection, IV drug use

EXAM:
CHEST - 2 VIEW

[chest pa]
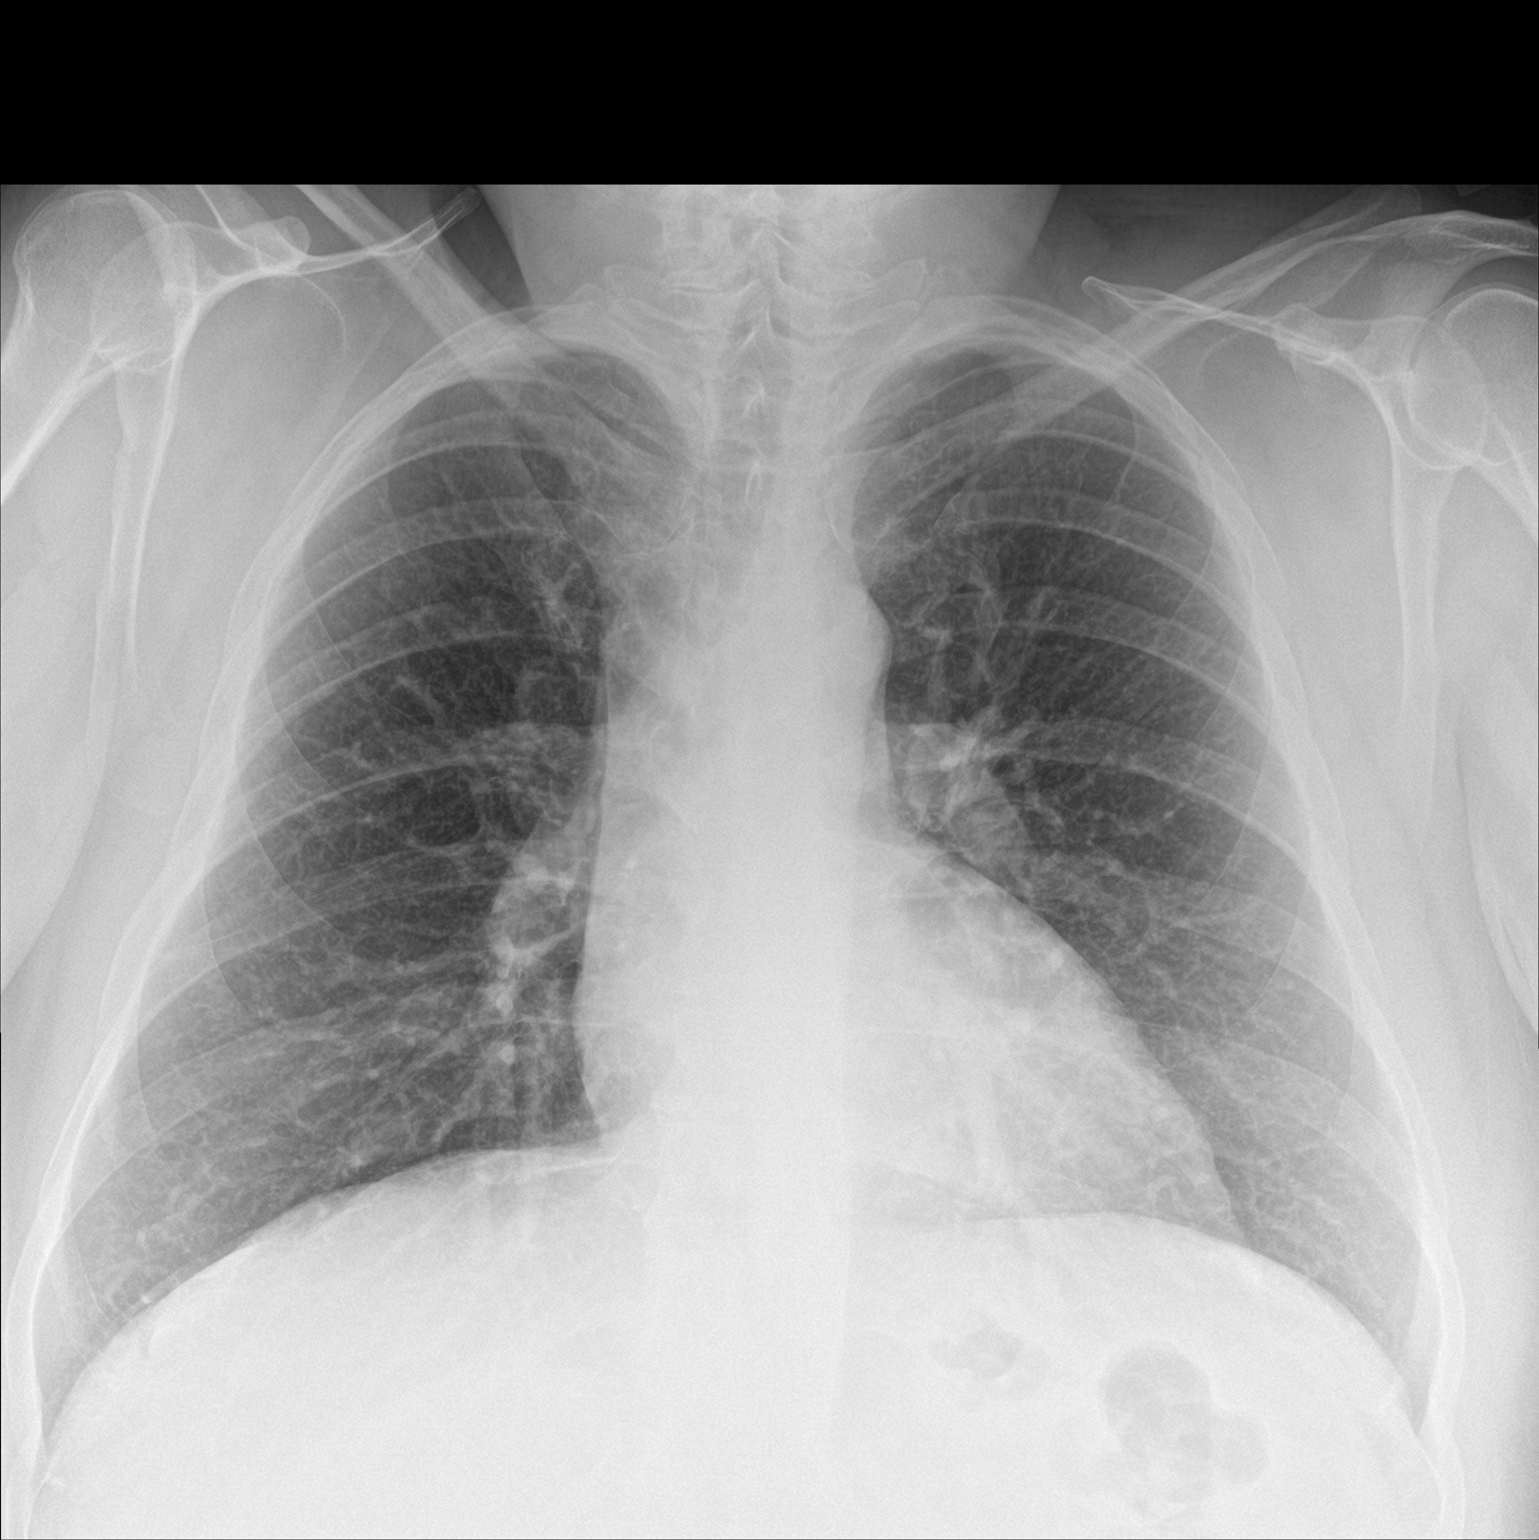

[chest lat]
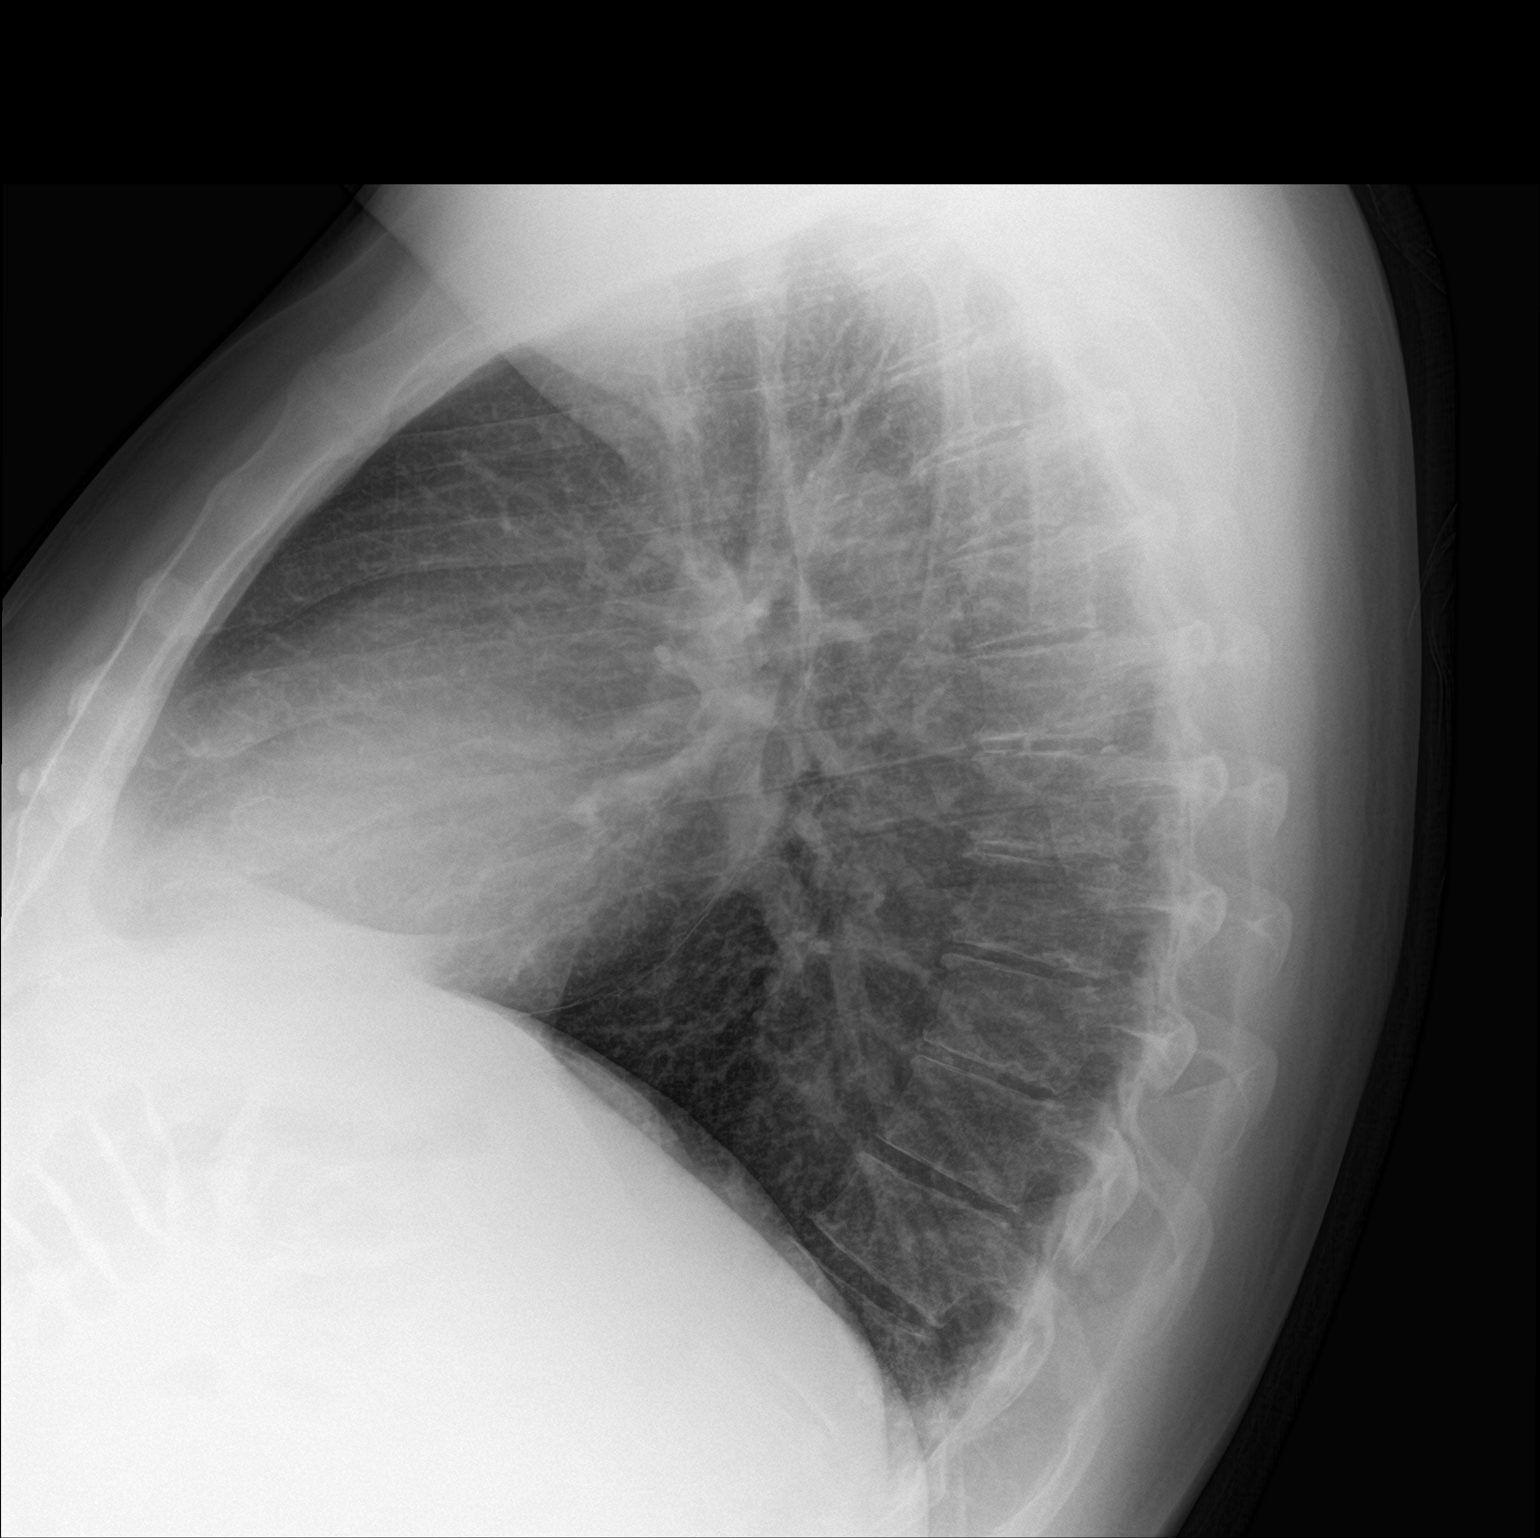

[2 of 2 positions shown; findings below may reference images not displayed]

FINDINGS: The heart size and mediastinal contours are within normal limits.
Both lungs are clear. The visualized skeletal structures are
unremarkable.
IMPRESSION: No active cardiopulmonary disease.

## 2020-02-19 MED ORDER — SODIUM CHLORIDE 0.9% FLUSH
3.0000 mL | Freq: Once | INTRAVENOUS | Status: DC
  Filled 2020-02-19: qty 3

## 2020-02-19 NOTE — ED Triage Notes (Signed)
Pt used IV meth 3 days ago. Today pt has redness, pain, and swelling at the injection sites of his R hand and forearm.

## 2020-02-19 NOTE — ED Provider Notes (Signed)
[facility] Provider Note: Lowella Dell, MD, FACEP  CSN: [id number] MRN: [id number] ARRIVAL: 02/19/20 at 2056 ROOM: MHOTF/OTF   CHIEF COMPLAINT  Arm Pain   HISTORY OF PRESENT ILLNESS  02/19/20 11:45 PM Jason Wolf is a 39 y.o. male with type 2 diabetes and polysubstance abuse.  He reportedly used IV methamphetamine 3 days ago.  Today he has developed redness, pain and swelling his right hand and forearm with a knot in the right antecubital fossa.  The swelling and pain are limiting his ability to move his fingers or wrist.  He rates associated pain is a 9 out of 10, worse with movement or palpation.  He was noted to have a low-grade fever of 100 on arrival and has had chills.   Past Medical History:  Diagnosis Date  . Bipolar disorder (HCC)   . Diabetes mellitus without complication (HCC)   . ETOH abuse   . Hypertension   . Opiate abuse, episodic [facility])     Past Surgical History:  Procedure Laterality Date  . KNEE ARTHROSCOPY      Family History  Problem Relation Age of Onset  . Diabetes type II Mother     Social History   Tobacco Use  . Smoking status: Current Every Day Smoker    Packs/day: 1.00  . Smokeless tobacco: Never Used  Vaping Use  . Vaping Use: Every day  Substance Use Topics  . Alcohol use: No    Comment: in rehab  . Drug use: Yes    Types: Methamphetamines    Comment: was in rehab and relapsed after 22 months. 02/19/20    Prior to Admission medications   Medication Sig Start Date End Date Taking? Authorizing Provider  citalopram (CELEXA) 40 MG tablet Take 40 mg by mouth at bedtime.     [provider]  diphenhydrAMINE (BENADRYL) 25 mg capsule Take 50 mg by mouth every 6 (six) hours as needed for itching, allergies or sleep.    [provider]  divalproex (DEPAKOTE) 500 MG DR tablet Take 1,000 mg by mouth 2 (two) times daily.     [provider]  fenofibrate (TRICOR) 145 MG tablet TAKE ONE TABLET BY MOUTH  EVERY DAY 11/01/15   McGowan, Carollee Herter A, PA-C  hydrochlorothiazide (MICROZIDE) 12.5 MG capsule Take 12.5 mg by mouth daily. Reported on 02/05/2016    [provider]  hydrOXYzine (VISTARIL) 25 MG capsule TAKE 1 CAPSULE BY MOUTH FOUR TIMES DAILY AS NEEDED FOR ANXIETY 01/23/20   [provider]  lisinopril (PRINIVIL,ZESTRIL) 40 MG tablet Take 40 mg by mouth daily.    [provider]  metFORMIN (GLUCOPHAGE) 1000 MG tablet Take 1,000 mg by mouth 2 (two) times daily with a meal.    [provider]  naproxen (NAPROSYN) 250 MG tablet Take 250 mg by mouth 2 (two) times daily as needed for moderate pain or headache.     [provider]  naproxen (NAPROSYN) 500 MG tablet Take 1 tablet (500 mg total) by mouth 2 (two) times daily. 07/27/19   Bethel Born, PA-C  sertraline (ZOLOFT) 50 MG tablet Take 50 mg by mouth daily. 01/26/20   [provider]    Allergies Codeine   REVIEW OF SYSTEMS  Negative except as noted here or in the History of Present Illness.   PHYSICAL EXAMINATION  Initial Vital Signs Blood pressure (!) 131/82, pulse 99, temperature 100 F (37.8 C), temperature source Oral, resp. rate 20, height 6' (1.829 m),  weight (!) 117.9 kg, SpO2 100 %.  Examination General: Well-developed, well-nourished male in no acute distress; appearance consistent with age of record HENT: normocephalic; atraumatic Eyes: pupils equal, round and reactive to light; extraocular muscles intact Neck: supple Heart: regular rate and rhythm; no murmur Lungs: clear to auscultation bilaterally Abdomen: soft; nondistended; nontender; bowel sounds present Extremities: No deformity; full range of motion; pulses normal x 4; tenderness, swelling and mild erythema of right forearm and hand with mass just distal to the right antecubital fossa, distal capillary refill brisk with intact sensation but motor function impaired at right wrist and fingers:      Neurologic:  Awake, alert and oriented; motor function intact in all extremities and symmetric; no facial droop Skin: Warm and dry Psychiatric: Normal mood and affect   RESULTS  Summary of this visit's results, reviewed and interpreted by myself:   EKG Interpretation  Date/Time:    Ventricular Rate:    PR Interval:    QRS Duration:   QT Interval:    QTC Calculation:   R Axis:     Text Interpretation:        Laboratory Studies: Results for orders placed or performed during the hospital encounter of 02/19/20 (from the past 24 hour(s))  Lactic acid, plasma     Status: None   Collection Time: 02/19/20  9:29 PM  Result Value Ref Range   Lactic Acid, Venous 0.9 0.5 - 1.9 mmol/L  Comprehensive metabolic panel     Status: Abnormal   Collection Time: 02/19/20  9:29 PM  Result Value Ref Range   Sodium 130 (L) 135 - 145 mmol/L   Potassium 3.9 3.5 - 5.1 mmol/L   Chloride 98 98 - 111 mmol/L   CO2 20 (L) 22 - 32 mmol/L   Glucose, Bld 333 (H) 70 - 99 mg/dL   BUN 10 6 - 20 mg/dL   Creatinine, Ser 0.96 (L) 0.61 - 1.24 mg/dL   Calcium 8.6 (L) 8.9 - 10.3 mg/dL   Total Protein 6.7 6.5 - 8.1 g/dL   Albumin 3.7 3.5 - 5.0 g/dL   AST 18 15 - 41 U/L   ALT 25 0 - 44 U/L   Alkaline Phosphatase 77 38 - 126 U/L   Total Bilirubin 0.7 0.3 - 1.2 mg/dL   GFR calc non Af Amer >60 >60 mL/min   GFR calc Af Amer >60 >60 mL/min   Anion gap 12 5 - 15  CBC with Differential     Status: Abnormal   Collection Time: 02/19/20  9:29 PM  Result Value Ref Range   WBC 15.5 (H) 4.0 - 10.5 K/uL   RBC 4.66 4.22 - 5.81 MIL/uL   Hemoglobin 14.0 13.0 - 17.0 g/dL   HCT 28.3 39 - 52 %   MCV 83.9 80.0 - 100.0 fL   MCH 30.0 26.0 - 34.0 pg   MCHC 35.8 30.0 - 36.0 g/dL   RDW 66.2 94.7 - 65.4 %   Platelets 335 150 - 400 K/uL   nRBC 0.0 0.0 - 0.2 %   Neutrophils Relative % 77 %   Neutro Abs 11.9 (H) 1.7 - 7.7 K/uL   Lymphocytes Relative 14 %   Lymphs Abs 2.2 0.7 - 4.0 K/uL   Monocytes Relative 6 %   Monocytes Absolute 1.0 0 -  1 K/uL   Eosinophils Relative 2 %   Eosinophils Absolute 0.3 0 - 0 K/uL   Basophils Relative 0 %   Basophils Absolute 0.1 0 -  0 K/uL   Immature Granulocytes 1 %   Abs Immature Granulocytes 0.07 0.00 - 0.07 K/uL  Lactic acid, plasma     Status: None   Collection Time: 02/20/20 12:20 AM  Result Value Ref Range   Lactic Acid, Venous 1.1 0.5 - 1.9 mmol/L   Imaging Studies: DG Chest 2 View  Result Date: 02/19/2020 CLINICAL DATA:  Infection, IV drug use EXAM: CHEST - 2 VIEW COMPARISON:  04/20/2017 FINDINGS: The heart size and mediastinal contours are within normal limits. Both lungs are clear. The visualized skeletal structures are unremarkable. IMPRESSION: No active cardiopulmonary disease. Electronically Signed   By: Helyn NumbersAshesh  Parikh MD   On: 02/19/2020 21:56   CT Extrem Up Entire Arm R W/CM  Result Date: 02/20/2020 CLINICAL DATA:  Soft tissue infection.  Cellulitis.  Forearm DVT. EXAM: CT OF THE UPPER RIGHT EXTREMITY WITH CONTRAST TECHNIQUE: Multidetector CT imaging of the upper right extremity was performed according to the standard protocol following intravenous contrast administration. COMPARISON:  None. CONTRAST:  100mL OMNIPAQUE IOHEXOL 300 MG/ML  SOLN FINDINGS: Bones/Joint/Cartilage There is no acute displaced fracture. No CT evidence for osteomyelitis. Ligaments Suboptimally assessed by CT. Muscles and Tendons No large intramuscular hematoma. Soft tissues There are mildly enlarged right axillary lymph nodes. There is subcutaneous edema involving the patient's forearm. There is associated skin thickening. There are no pockets of subcutaneous gas. There is extensive soft tissue swelling about the patient's dorsal right hand with findings suspicious for a developing abscess within the dorsal subcutaneous soft tissues at the level of the wrist (axial series 8, image 672 and coronal series 9, image 112). This collection measures approximately 2 by 0.6 by 2.4 cm. This collection is suboptimally  evaluated secondary to extensive streak artifact. IMPRESSION: 1. No acute osseous abnormality. 2. There is subcutaneous soft tissue edema involving the patient's forearm and hand. This is consistent with cellulitis in the appropriate clinical setting. 3. Findings are suspicious for a small subcutaneous fluid collection involving the dorsal soft tissues at the level of the patient's wrist. This is concerning for developing abscess. This finding is suboptimally evaluated secondary to extensive streak artifact. A dedicated ultrasound would be useful for further evaluation. 4. Mildly enlarged, likely reactive lymph nodes in the right axilla. Electronically Signed   By: Katherine Mantlehristopher  Green M.D.   On: 02/20/2020 01:02    ED COURSE and MDM  Nursing notes, initial and subsequent vitals signs, including pulse oximetry, reviewed and interpreted by myself.  Vitals:   02/19/20 2113 02/19/20 2114 02/20/20 0021 02/20/20 0230  BP: (!) 131/82  (!) 130/74 (!) 134/82  Pulse: 99  95 90  Resp: 20  17 15   Temp: 100 F (37.8 C)  99.5 F (37.5 C)   TempSrc: Oral  Oral   SpO2: 100%  99% 98%  Weight:  (!) 117.9 kg    Height:  6' (1.829 m)     Medications  sodium chloride flush (NS) 0.9 % injection 3 mL (3 mLs Intravenous Not Given 02/20/20 0013)  vancomycin (VANCOREADY) IVPB 2000 mg/400 mL (has no administration in time range)  insulin aspart (novoLOG) injection 10 Units (10 Units Subcutaneous Given 02/20/20 0059)  sodium chloride 0.9 % bolus 1,000 mL ( Intravenous Stopped 02/20/20 0203)  ketorolac (TORADOL) 15 MG/ML injection 15 mg (15 mg Intravenous Given 02/20/20 0059)  iohexol (OMNIPAQUE) 300 MG/ML solution 100 mL (100 mLs Intravenous Contrast Given 02/20/20 0034)  vancomycin (VANCOCIN) 1-5 GM/200ML-% IVPB (  Stopped 02/20/20 0310)  fentaNYL (SUBLIMAZE) injection  100 mcg (100 mcg Intravenous Given 02/20/20 0305)   1:32 AM Patient given vancomycin 2 g IV for cellulitis possible abscess.  Will have patient admitted  to the hospitalist service with hand surgery consultation.   1:49 AM [facility] hospitalist accepts for admission to [facility]  Dr. Roney Mans consulted for hand surgery.   PROCEDURES  Procedures CRITICAL CARE Performed by: Carlisle Beers Sherrine Salberg Total critical care time: 30 minutes Critical care time was exclusive of separately billable procedures and treating other patients. Critical care was necessary to treat or prevent imminent or life-threatening deterioration. Critical care was time spent personally by me on the following activities: development of treatment plan with patient and/or surrogate as well as nursing, discussions with consultants, evaluation of patient's response to treatment, examination of patient, obtaining history from patient or surrogate, ordering and performing treatments and interventions, ordering and review of laboratory studies, ordering and review of radiographic studies, pulse oximetry and re-evaluation of patient's condition.   ED DIAGNOSES     ICD-10-CM   1. Cellulitis of right arm  L03.113   2. IV drug abuse (HCC)  F19.10   3. Abscess of right arm  L02.413        Judye Lorino, Jonny Ruiz, MD 02/20/20 [phone]

## 2020-02-20 ENCOUNTER — Inpatient Hospital Stay (HOSPITAL_COMMUNITY): Payer: Self-pay | Admitting: Anesthesiology

## 2020-02-20 ENCOUNTER — Emergency Department (HOSPITAL_BASED_OUTPATIENT_CLINIC_OR_DEPARTMENT_OTHER): Payer: Self-pay

## 2020-02-20 ENCOUNTER — Other Ambulatory Visit: Payer: Self-pay

## 2020-02-20 ENCOUNTER — Encounter (HOSPITAL_COMMUNITY)
Admission: EM | Disposition: A | Discharge status: Home / Self Care | Payer: Self-pay | Source: Home / Self Care | Attending: Internal Medicine

## 2020-02-20 ENCOUNTER — Encounter (HOSPITAL_BASED_OUTPATIENT_CLINIC_OR_DEPARTMENT_OTHER): Payer: Self-pay

## 2020-02-20 DIAGNOSIS — I10 Essential (primary) hypertension: Secondary | ICD-10-CM

## 2020-02-20 DIAGNOSIS — L03113 Cellulitis of right upper limb: Secondary | ICD-10-CM | POA: Diagnosis present

## 2020-02-20 DIAGNOSIS — F3181 Bipolar II disorder: Secondary | ICD-10-CM

## 2020-02-20 DIAGNOSIS — L02413 Cutaneous abscess of right upper limb: Principal | ICD-10-CM

## 2020-02-20 DIAGNOSIS — E119 Type 2 diabetes mellitus without complications: Secondary | ICD-10-CM

## 2020-02-20 DIAGNOSIS — F1990 Other psychoactive substance use, unspecified, uncomplicated: Secondary | ICD-10-CM | POA: Diagnosis present

## 2020-02-20 DIAGNOSIS — F1910 Other psychoactive substance abuse, uncomplicated: Secondary | ICD-10-CM

## 2020-02-20 HISTORY — PX: I & D EXTREMITY: SHX5045

## 2020-02-20 LAB — HEMOGLOBIN A1C
Hgb A1c MFr Bld: 11 % — ABNORMAL HIGH (ref 4.8–5.6)
Mean Plasma Glucose: 269 mg/dL

## 2020-02-20 LAB — URINALYSIS, ROUTINE W REFLEX MICROSCOPIC
Bacteria, UA: NONE SEEN
Bilirubin Urine: NEGATIVE
Glucose, UA: >=500 mg/dL — AB
Hgb urine dipstick: NEGATIVE
Ketones, ur: 20 mg/dL — AB
Leukocytes,Ua: NEGATIVE
Nitrite: NEGATIVE
Protein, ur: NEGATIVE mg/dL
Specific Gravity, Urine: >1.046 — ABNORMAL HIGH (ref 1.005–1.030)
pH: 6 (ref 5.0–8.0)

## 2020-02-20 LAB — GLUCOSE, CAPILLARY
Glucose-Capillary: 238 mg/dL — ABNORMAL HIGH (ref 70–99)
Glucose-Capillary: 200 mg/dL — ABNORMAL HIGH (ref 70–99)
Glucose-Capillary: 186 mg/dL — ABNORMAL HIGH (ref 70–99)
Glucose-Capillary: 273 mg/dL — ABNORMAL HIGH (ref 70–99)
Glucose-Capillary: 303 mg/dL — ABNORMAL HIGH (ref 70–99)
Glucose-Capillary: 356 mg/dL — ABNORMAL HIGH (ref 70–99)
Glucose-Capillary: 315 mg/dL — ABNORMAL HIGH (ref 70–99)

## 2020-02-20 LAB — SURGICAL PCR SCREEN
MRSA, PCR: NEGATIVE
Staphylococcus aureus: NEGATIVE

## 2020-02-20 LAB — LACTIC ACID, PLASMA: Lactic Acid, Venous: 1.1 mmol/L (ref 0.5–1.9)

## 2020-02-20 LAB — HIV ANTIBODY (ROUTINE TESTING W REFLEX): HIV Screen 4th Generation wRfx: NONREACTIVE

## 2020-02-20 LAB — SARS CORONAVIRUS 2 BY RT PCR (HOSPITAL ORDER, PERFORMED IN ~~LOC~~ HOSPITAL LAB): SARS Coronavirus 2: NEGATIVE

## 2020-02-20 IMAGING — CT CT EXTREM UP ENTIRE ARM*R* W/ CM
2 of 3 series · 8 of 33 positions shown, 10 images · IV contrast (agent unspecified)
Comparison: None.

CONTRAST:  100mL OMNIPAQUE IOHEXOL 300 MG/ML  SOLN

CLINICAL DATA: Soft tissue infection.  Cellulitis.  Forearm DVT.

EXAM:
CT OF THE UPPER RIGHT EXTREMITY WITH CONTRAST
TECHNIQUE: Multidetector CT imaging of the upper right extremity was performed
according to the standard protocol following intravenous contrast
administration.

[Series 8: ax st · axial · 0.39mm/px · z∈[-473,+96]mm · 5 of 823 slices shown, 7 images]
[im 127/823  soft-tissue]
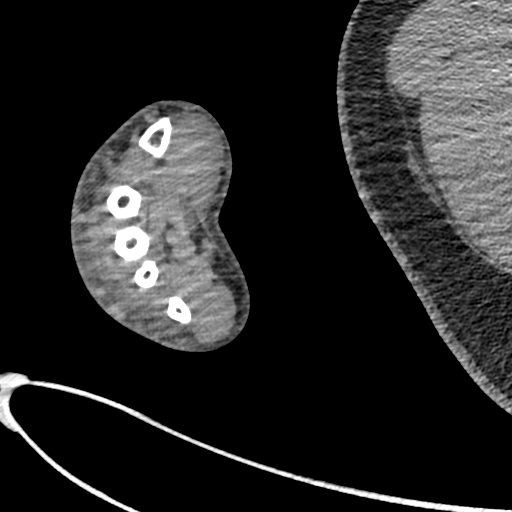
[im 127/823  bone]
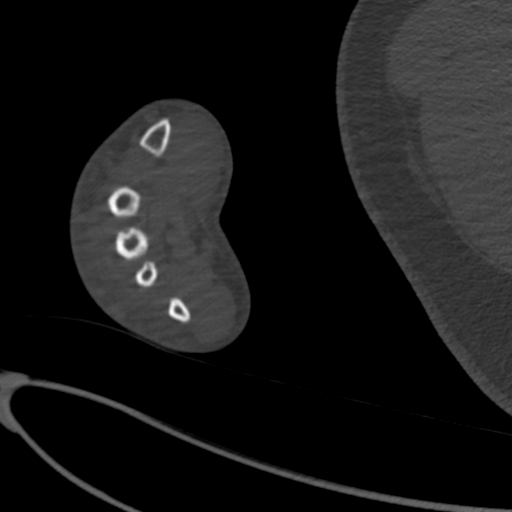
[im 253/823  bone]
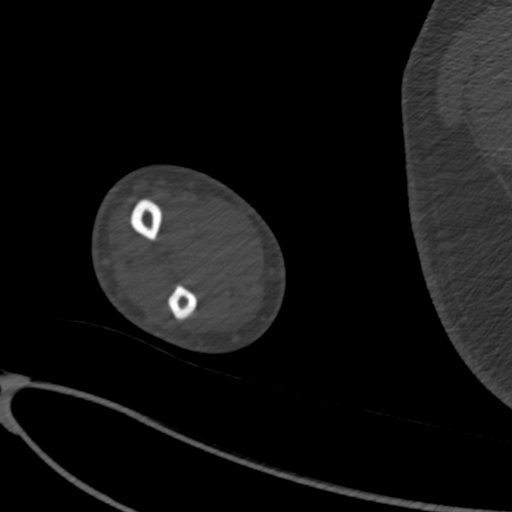
[im 443/823  bone]
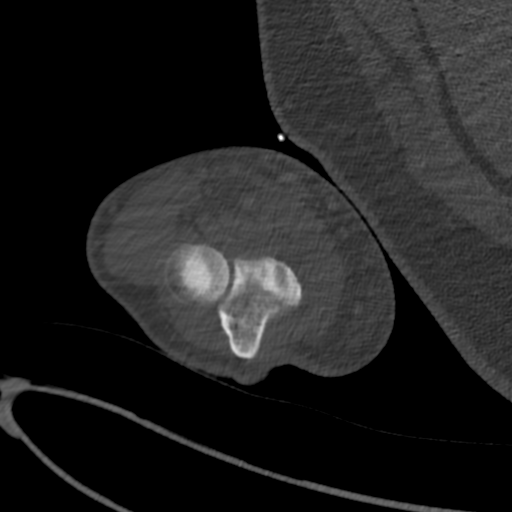
[im 570/823  bone]
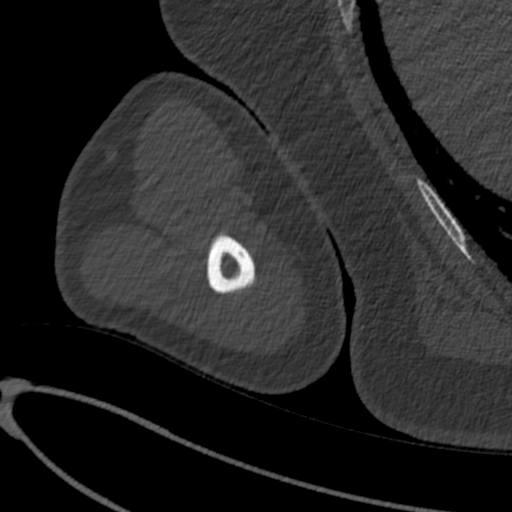
[im 696/823  soft-tissue]
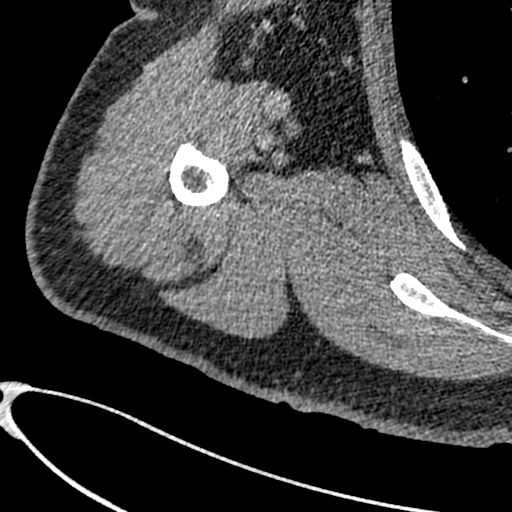
[im 696/823  bone]
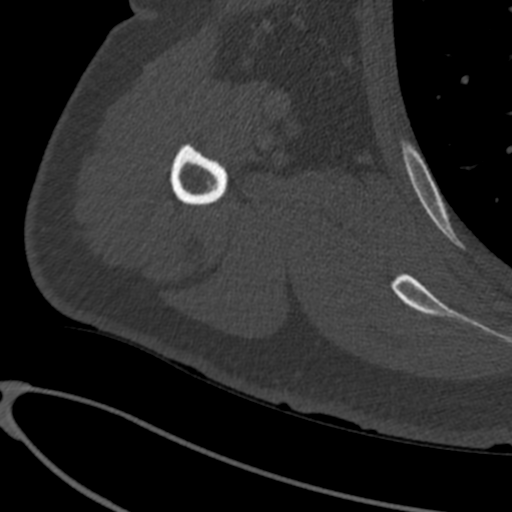

[Series 9: cor st · coronal · 0.42mm/px · 3 of 166 slices shown]
[im 34/166  bone]
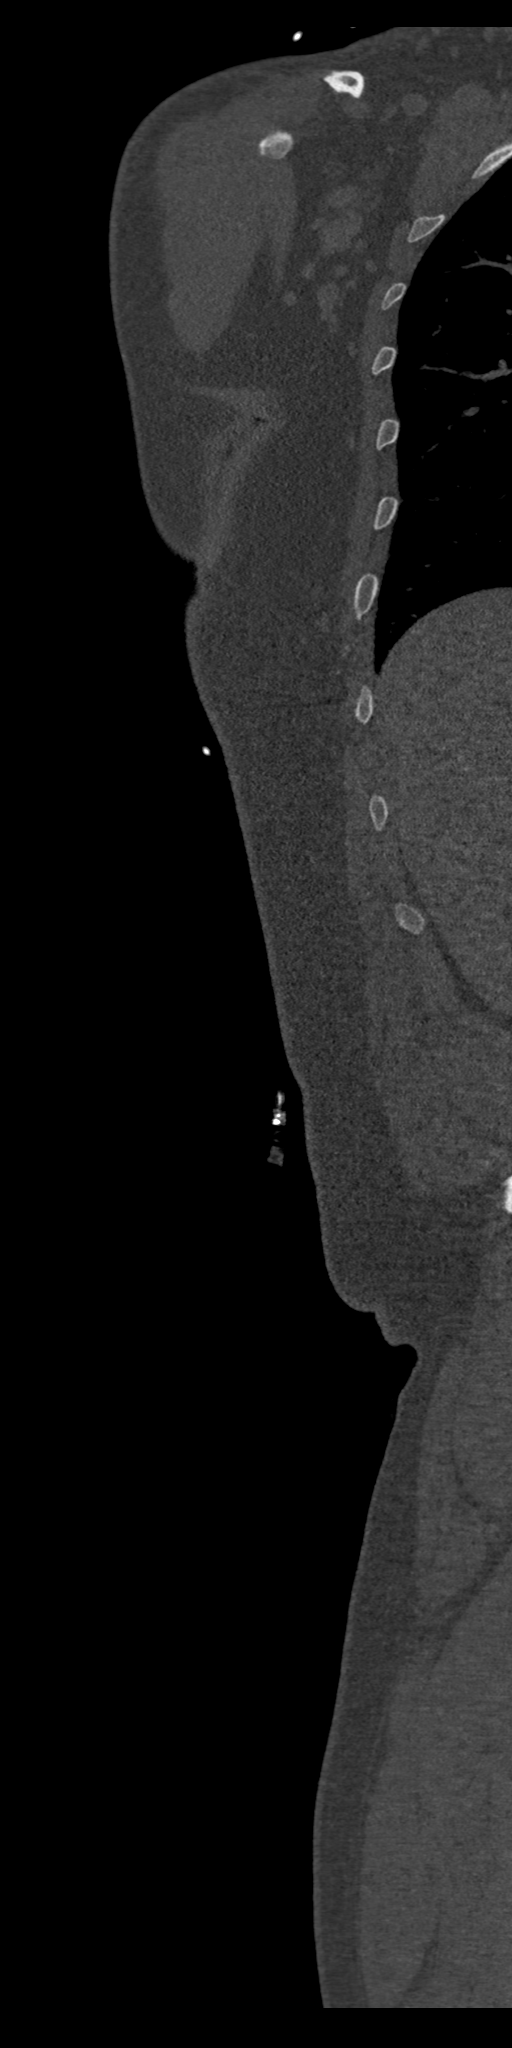
[im 67/166  bone]
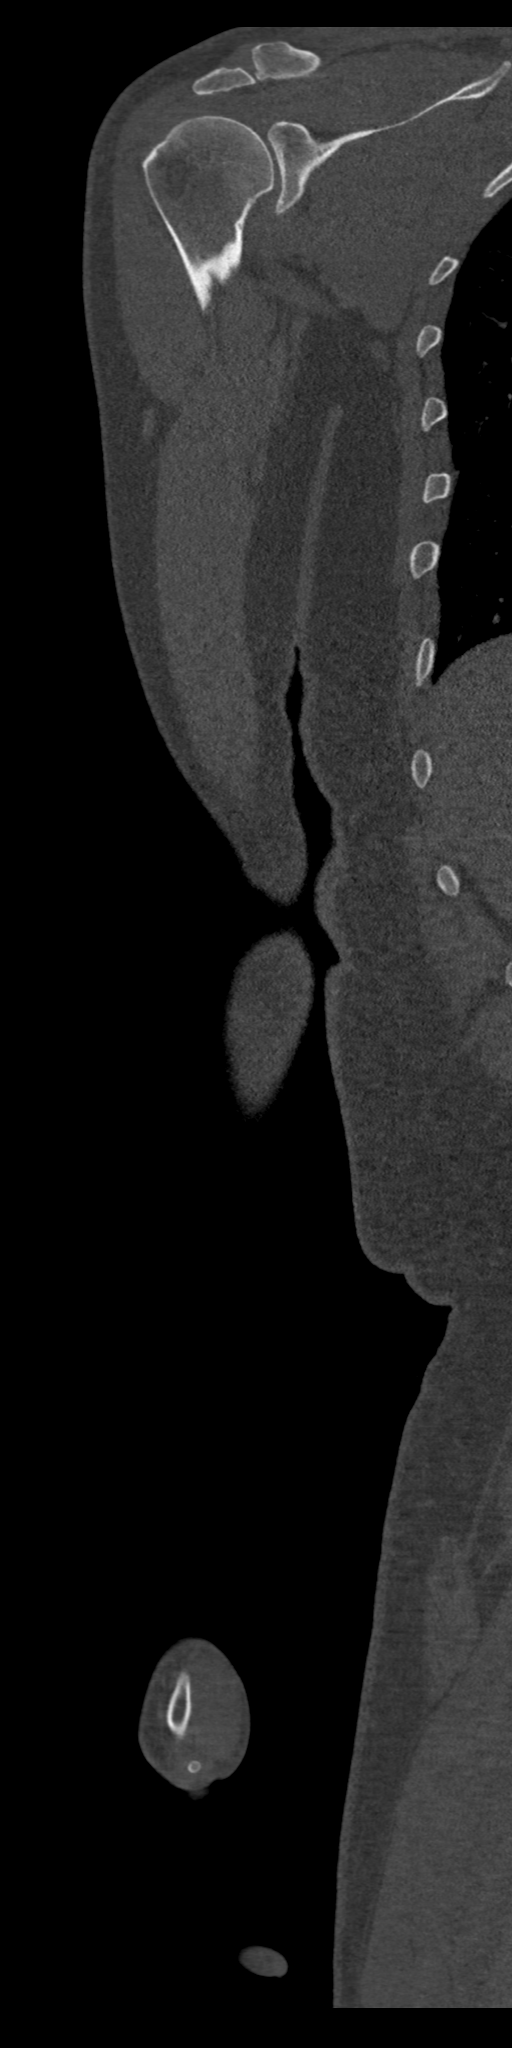
[im 100/166  bone]
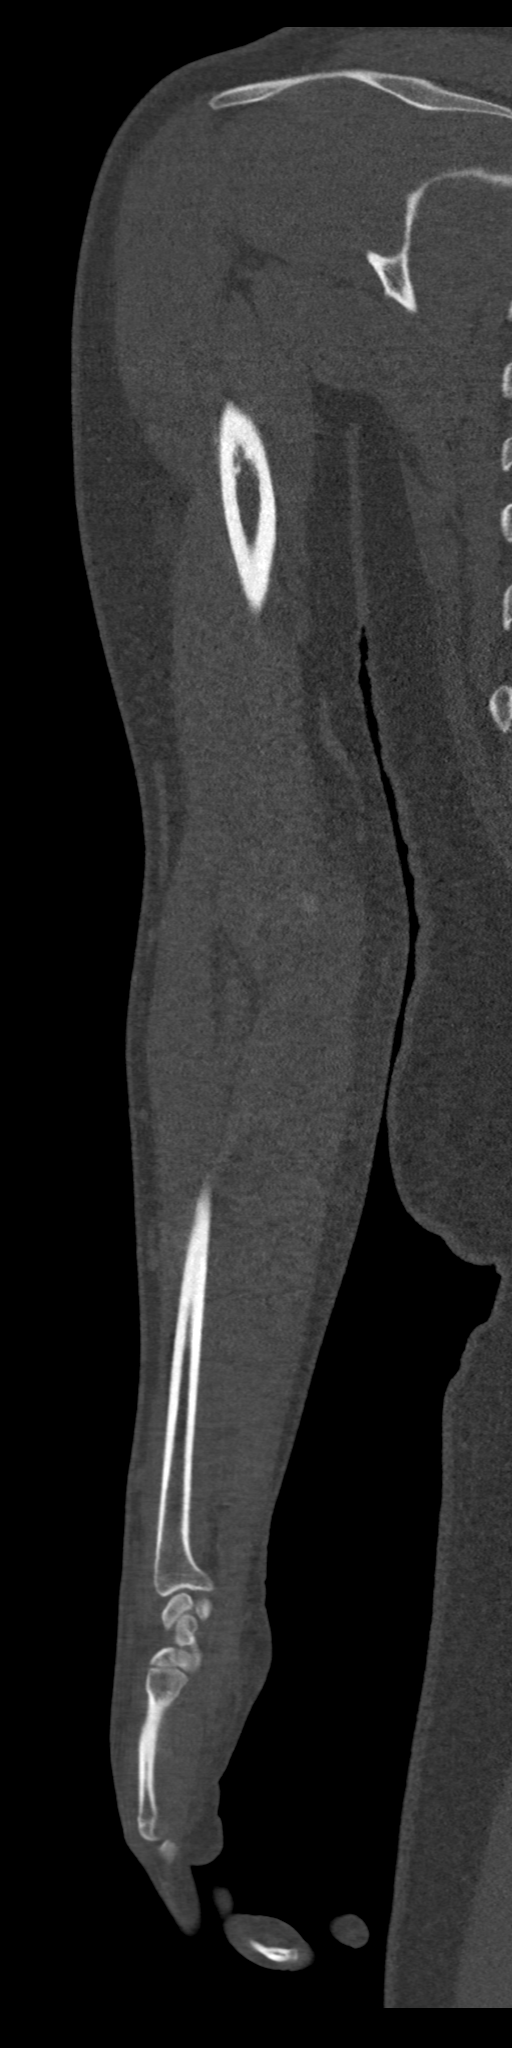

[8 of 33 positions shown; findings below may reference images not displayed]

FINDINGS: Bones/Joint/Cartilage

There is no acute displaced fracture. No CT evidence for
osteomyelitis.

Ligaments

Suboptimally assessed by CT.

Muscles and Tendons

No large intramuscular hematoma.

Soft tissues

There are mildly enlarged right axillary lymph nodes. There is
subcutaneous edema involving the patient's forearm. There is
associated skin thickening. There are no pockets of subcutaneous
gas. There is extensive soft tissue swelling about the patient's
dorsal right hand with findings suspicious for a developing abscess
within the dorsal subcutaneous soft tissues at the level of the
wrist (axial series 8, image 672 and coronal series 9, image 112).
This collection measures approximately 2 by 0.6 by 2.4 cm. This
collection is suboptimally evaluated secondary to extensive streak
artifact.
IMPRESSION: 1. No acute osseous abnormality.
2. There is subcutaneous soft tissue edema involving the patient's
forearm and hand. This is consistent with cellulitis in the
appropriate clinical setting.
3. Findings are suspicious for a small subcutaneous fluid collection
involving the dorsal soft tissues at the level of the patient's
wrist. This is concerning for developing abscess. This finding is
suboptimally evaluated secondary to extensive streak artifact. A
dedicated ultrasound would be useful for further evaluation.
4. Mildly enlarged, likely reactive lymph nodes in the right axilla.

## 2020-02-20 SURGERY — IRRIGATION AND DEBRIDEMENT EXTREMITY
Anesthesia: General | Site: Hand | Laterality: Right

## 2020-02-20 MED ORDER — FENTANYL CITRATE (PF) 250 MCG/5ML IJ SOLN
INTRAMUSCULAR | Status: AC
  Filled 2020-02-20: qty 5

## 2020-02-20 MED ORDER — FENTANYL CITRATE (PF) 250 MCG/5ML IJ SOLN
INTRAMUSCULAR | Status: DC | PRN
  Administered 2020-02-20 (×2): 50 ug via INTRAVENOUS

## 2020-02-20 MED ORDER — CHLORHEXIDINE GLUCONATE 0.12 % MT SOLN
OROMUCOSAL | Status: AC
  Administered 2020-02-20: 15 mL via OROMUCOSAL
  Filled 2020-02-20: qty 15

## 2020-02-20 MED ORDER — BUPIVACAINE HCL (PF) 0.25 % IJ SOLN
INTRAMUSCULAR | Status: AC
  Filled 2020-02-20: qty 30

## 2020-02-20 MED ORDER — KETOROLAC TROMETHAMINE 15 MG/ML IJ SOLN
15.0000 mg | Freq: Once | INTRAMUSCULAR | Status: AC
  Administered 2020-02-20: 15 mg via INTRAVENOUS
  Filled 2020-02-20: qty 1

## 2020-02-20 MED ORDER — HYDROCODONE-ACETAMINOPHEN 5-325 MG PO TABS
1.0000 | ORAL_TABLET | Freq: Four times a day (QID) | ORAL | Status: DC | PRN
  Administered 2020-02-20 – 2020-02-23 (×8): 2 via ORAL
  Filled 2020-02-20 (×8): qty 2

## 2020-02-20 MED ORDER — DEXAMETHASONE SODIUM PHOSPHATE 10 MG/ML IJ SOLN
INTRAMUSCULAR | Status: DC | PRN
  Administered 2020-02-20: 10 mg via INTRAVENOUS

## 2020-02-20 MED ORDER — PHENYLEPHRINE 40 MCG/ML (10ML) SYRINGE FOR IV PUSH (FOR BLOOD PRESSURE SUPPORT)
PREFILLED_SYRINGE | INTRAVENOUS | Status: DC | PRN
  Administered 2020-02-20: 80 ug via INTRAVENOUS

## 2020-02-20 MED ORDER — ONDANSETRON HCL 4 MG/2ML IJ SOLN
INTRAMUSCULAR | Status: AC
  Filled 2020-02-20: qty 2

## 2020-02-20 MED ORDER — OXYCODONE HCL 5 MG PO TABS: 5.0000 mg | ORAL_TABLET | Freq: Once | ORAL | Status: DC | PRN

## 2020-02-20 MED ORDER — PROPOFOL 10 MG/ML IV BOLUS
INTRAVENOUS | Status: DC | PRN
  Administered 2020-02-20: 200 mg via INTRAVENOUS

## 2020-02-20 MED ORDER — ONDANSETRON HCL 4 MG/2ML IJ SOLN: 4.0000 mg | Freq: Four times a day (QID) | INTRAMUSCULAR | Status: DC | PRN

## 2020-02-20 MED ORDER — MIDAZOLAM HCL 5 MG/5ML IJ SOLN
INTRAMUSCULAR | Status: DC | PRN
  Administered 2020-02-20: 2 mg via INTRAVENOUS

## 2020-02-20 MED ORDER — VANCOMYCIN HCL IN DEXTROSE 1-5 GM/200ML-% IV SOLN
1000.0000 mg | Freq: Three times a day (TID) | INTRAVENOUS | Status: DC
  Administered 2020-02-20 – 2020-02-22 (×6): 1000 mg via INTRAVENOUS
  Filled 2020-02-20 (×9): qty 200

## 2020-02-20 MED ORDER — ACETAMINOPHEN 10 MG/ML IV SOLN: 1000.0000 mg | Freq: Once | INTRAVENOUS | Status: DC | PRN

## 2020-02-20 MED ORDER — ONDANSETRON HCL 4 MG PO TABS: 4.0000 mg | ORAL_TABLET | Freq: Four times a day (QID) | ORAL | Status: DC | PRN

## 2020-02-20 MED ORDER — INSULIN ASPART 100 UNIT/ML ~~LOC~~ SOLN
10.0000 [IU] | Freq: Once | SUBCUTANEOUS | Status: AC
  Administered 2020-02-20: 10 [IU] via SUBCUTANEOUS
  Filled 2020-02-20: qty 10

## 2020-02-20 MED ORDER — ACETAMINOPHEN 325 MG PO TABS: 650.0000 mg | ORAL_TABLET | Freq: Four times a day (QID) | ORAL | Status: DC | PRN

## 2020-02-20 MED ORDER — CHLORHEXIDINE GLUCONATE 0.12 % MT SOLN: 15.0000 mL | Freq: Once | OROMUCOSAL | Status: AC

## 2020-02-20 MED ORDER — ONDANSETRON HCL 4 MG/2ML IJ SOLN
INTRAMUSCULAR | Status: DC | PRN
  Administered 2020-02-20: 4 mg via INTRAVENOUS

## 2020-02-20 MED ORDER — PROMETHAZINE HCL 25 MG/ML IJ SOLN: 6.2500 mg | INTRAMUSCULAR | Status: DC | PRN

## 2020-02-20 MED ORDER — KETOROLAC TROMETHAMINE 30 MG/ML IJ SOLN
INTRAMUSCULAR | Status: AC
  Filled 2020-02-20: qty 1

## 2020-02-20 MED ORDER — ACETAMINOPHEN 650 MG RE SUPP: 650.0000 mg | Freq: Four times a day (QID) | RECTAL | Status: DC | PRN

## 2020-02-20 MED ORDER — LACTATED RINGERS IV SOLN: INTRAVENOUS | Status: DC

## 2020-02-20 MED ORDER — VANCOMYCIN HCL 2000 MG/400ML IV SOLN
2000.0000 mg | Freq: Once | INTRAVENOUS | Status: AC
  Filled 2020-02-20: qty 400

## 2020-02-20 MED ORDER — HYDROMORPHONE HCL 1 MG/ML IJ SOLN
0.2500 mg | INTRAMUSCULAR | Status: DC | PRN
  Administered 2020-02-20: 0.5 mg via INTRAVENOUS

## 2020-02-20 MED ORDER — 0.9 % SODIUM CHLORIDE (POUR BTL) OPTIME
TOPICAL | Status: DC | PRN
  Administered 2020-02-20: 1000 mL

## 2020-02-20 MED ORDER — VANCOMYCIN HCL IN DEXTROSE 1-5 GM/200ML-% IV SOLN
INTRAVENOUS | Status: AC
  Administered 2020-02-20: 2000 mg
  Filled 2020-02-20: qty 400

## 2020-02-20 MED ORDER — IOHEXOL 300 MG/ML  SOLN
100.0000 mL | Freq: Once | INTRAMUSCULAR | Status: AC | PRN
  Administered 2020-02-20: 100 mL via INTRAVENOUS

## 2020-02-20 MED ORDER — KETOROLAC TROMETHAMINE 30 MG/ML IJ SOLN
30.0000 mg | Freq: Once | INTRAMUSCULAR | Status: AC
  Administered 2020-02-20: 30 mg via INTRAVENOUS

## 2020-02-20 MED ORDER — PHENYLEPHRINE 40 MCG/ML (10ML) SYRINGE FOR IV PUSH (FOR BLOOD PRESSURE SUPPORT)
PREFILLED_SYRINGE | INTRAVENOUS | Status: AC
  Filled 2020-02-20: qty 10

## 2020-02-20 MED ORDER — OXYCODONE HCL 5 MG/5ML PO SOLN: 5.0000 mg | Freq: Once | ORAL | Status: DC | PRN

## 2020-02-20 MED ORDER — MIDAZOLAM HCL 2 MG/2ML IJ SOLN
INTRAMUSCULAR | Status: AC
  Filled 2020-02-20: qty 2

## 2020-02-20 MED ORDER — INSULIN ASPART 100 UNIT/ML ~~LOC~~ SOLN
0.0000 [IU] | SUBCUTANEOUS | Status: DC
  Administered 2020-02-20: 9 [IU] via SUBCUTANEOUS
  Administered 2020-02-20: 7 [IU] via SUBCUTANEOUS
  Administered 2020-02-20: 3 [IU] via SUBCUTANEOUS
  Administered 2020-02-20: 7 [IU] via SUBCUTANEOUS
  Administered 2020-02-20: 2 [IU] via SUBCUTANEOUS
  Administered 2020-02-21: 3 [IU] via SUBCUTANEOUS
  Administered 2020-02-21: 5 [IU] via SUBCUTANEOUS
  Administered 2020-02-21: 3 [IU] via SUBCUTANEOUS
  Administered 2020-02-21: 5 [IU] via SUBCUTANEOUS
  Administered 2020-02-21: 3 [IU] via SUBCUTANEOUS

## 2020-02-20 MED ORDER — LIDOCAINE 2% (20 MG/ML) 5 ML SYRINGE
INTRAMUSCULAR | Status: AC
  Filled 2020-02-20: qty 10

## 2020-02-20 MED ORDER — PROPOFOL 10 MG/ML IV BOLUS
INTRAVENOUS | Status: AC
  Filled 2020-02-20: qty 20

## 2020-02-20 MED ORDER — LIDOCAINE 2% (20 MG/ML) 5 ML SYRINGE
INTRAMUSCULAR | Status: DC | PRN
  Administered 2020-02-20: 60 mg via INTRAVENOUS

## 2020-02-20 MED ORDER — FENTANYL CITRATE (PF) 100 MCG/2ML IJ SOLN
100.0000 ug | Freq: Once | INTRAMUSCULAR | Status: AC
  Administered 2020-02-20: 100 ug via INTRAVENOUS
  Filled 2020-02-20: qty 2

## 2020-02-20 MED ORDER — ENOXAPARIN SODIUM 60 MG/0.6ML ~~LOC~~ SOLN
50.0000 mg | SUBCUTANEOUS | Status: DC
  Filled 2020-02-20: qty 0.6

## 2020-02-20 MED ORDER — SODIUM CHLORIDE 0.9 % IV BOLUS
1000.0000 mL | Freq: Once | INTRAVENOUS | Status: AC
  Administered 2020-02-20: 1000 mL via INTRAVENOUS

## 2020-02-20 MED ORDER — HYDROMORPHONE HCL 1 MG/ML IJ SOLN
INTRAMUSCULAR | Status: AC
  Filled 2020-02-20: qty 1

## 2020-02-20 MED ORDER — HYDROCODONE-ACETAMINOPHEN 5-325 MG PO TABS
1.0000 | ORAL_TABLET | Freq: Four times a day (QID) | ORAL | Status: DC | PRN
  Administered 2020-02-20: 2 via ORAL
  Filled 2020-02-20: qty 2

## 2020-02-20 MED ORDER — KETOROLAC TROMETHAMINE 30 MG/ML IJ SOLN: 30.0000 mg | Freq: Four times a day (QID) | INTRAMUSCULAR | Status: DC

## 2020-02-20 MED ORDER — HYDROMORPHONE HCL 1 MG/ML IJ SOLN
1.0000 mg | INTRAMUSCULAR | Status: DC | PRN
  Administered 2020-02-20 – 2020-02-22 (×10): 1 mg via INTRAVENOUS
  Filled 2020-02-20 (×11): qty 1

## 2020-02-20 MED ORDER — SODIUM CHLORIDE 0.9 % IR SOLN
Status: DC | PRN
  Administered 2020-02-20: 3000 mL

## 2020-02-20 SURGICAL SUPPLY — 43 items
BNDG ELASTIC 4X5.8 VLCR STR LF (GAUZE/BANDAGES/DRESSINGS) ×3 IMPLANT
BNDG ESMARK 4X9 LF (GAUZE/BANDAGES/DRESSINGS) IMPLANT
BNDG GAUZE ELAST 4 BULKY (GAUZE/BANDAGES/DRESSINGS) ×6 IMPLANT
CHLORAPREP W/TINT 26 (MISCELLANEOUS) ×3 IMPLANT
CORD BIPOLAR FORCEPS 12FT (ELECTRODE) ×3 IMPLANT
COVER SURGICAL LIGHT HANDLE (MISCELLANEOUS) ×3 IMPLANT
CUFF TOURN SGL QUICK 18X4 (TOURNIQUET CUFF) ×3 IMPLANT
DRAIN PENROSE 18X1/4 LTX STRL (DRAIN) ×3 IMPLANT
DRAPE U-SHAPE 47X51 STRL (DRAPES) ×3 IMPLANT
GAUZE SPONGE 4X4 12PLY STRL (GAUZE/BANDAGES/DRESSINGS) ×6 IMPLANT
GAUZE XEROFORM 5X9 LF (GAUZE/BANDAGES/DRESSINGS) ×3 IMPLANT
GLOVE BIOGEL PI IND STRL 8 (GLOVE) ×4 IMPLANT
GLOVE BIOGEL PI INDICATOR 8 (GLOVE) ×8
GLOVE SURG SYN 7.5  E (GLOVE) ×10
GLOVE SURG SYN 7.5 E (GLOVE) ×5 IMPLANT
GOWN STRL REUS W/ TWL LRG LVL3 (GOWN DISPOSABLE) ×3 IMPLANT
GOWN STRL REUS W/TWL LRG LVL3 (GOWN DISPOSABLE) ×6
IV NS IRRIG 3000ML ARTHROMATIC (IV SOLUTION) ×3 IMPLANT
KIT BASIN OR (CUSTOM PROCEDURE TRAY) ×3 IMPLANT
KIT TURNOVER KIT B (KITS) ×3 IMPLANT
MANIFOLD NEPTUNE II (INSTRUMENTS) ×3 IMPLANT
NEEDLE HYPO 25GX1X1/2 BEV (NEEDLE) IMPLANT
NS IRRIG 1000ML POUR BTL (IV SOLUTION) ×3 IMPLANT
PACK ORTHO EXTREMITY (CUSTOM PROCEDURE TRAY) ×3 IMPLANT
PAD ARMBOARD 7.5X6 YLW CONV (MISCELLANEOUS) ×3 IMPLANT
PAD CAST 4YDX4 CTTN HI CHSV (CAST SUPPLIES) ×1 IMPLANT
PADDING CAST COTTON 4X4 STRL (CAST SUPPLIES) ×2
SET CYSTO W/LG BORE CLAMP LF (SET/KITS/TRAYS/PACK) ×3 IMPLANT
SPONGE LAP 4X18 RFD (DISPOSABLE) ×3 IMPLANT
SUCTION FRAZIER HANDLE 10FR (MISCELLANEOUS) ×2
SUCTION TUBE FRAZIER 10FR DISP (MISCELLANEOUS) ×1 IMPLANT
SUT PROLENE 4 0 PS 2 18 (SUTURE) ×6 IMPLANT
SUT SILK 3 0 REEL (SUTURE) ×3 IMPLANT
SWAB CULTURE ESWAB REG 1ML (MISCELLANEOUS) ×12 IMPLANT
SYR BULB EAR ULCER 3OZ GRN STR (SYRINGE) ×3 IMPLANT
SYR CONTROL 10ML LL (SYRINGE) IMPLANT
TOWEL GREEN STERILE (TOWEL DISPOSABLE) ×3 IMPLANT
TOWEL GREEN STERILE FF (TOWEL DISPOSABLE) ×3 IMPLANT
TUBE CONNECTING 12'X1/4 (SUCTIONS) ×1
TUBE CONNECTING 12X1/4 (SUCTIONS) ×2 IMPLANT
TUBING TUR DISP (UROLOGICAL SUPPLIES) IMPLANT
UNDERPAD 30X36 HEAVY ABSORB (UNDERPADS AND DIAPERS) ×6 IMPLANT
YANKAUER SUCT BULB TIP NO VENT (SUCTIONS) ×3 IMPLANT

## 2020-02-20 NOTE — Op Note (Signed)
PREOPERATIVE DIAGNOSIS: Right wrist and arm infection following IV drug injection  POSTOPERATIVE DIAGNOSIS: Same  ATTENDING PHYSICIAN: Gasper Lloyd. Roney Mans, III, MD who was present and scrubbed for the entire case   ASSISTANT SURGEON: None.   ANESTHESIA: General  SURGICAL PROCEDURES: 1.  Irrigation treatment of right dorsal wrist abscess 2.  Extensor tenosynovectomy of the second, third, fourth and fifth dorsal compartments for infectious tenosynovitis 3.  Irrigation debridement of right antecubital fossa abscess  SURGICAL INDICATIONS: Patient is a 39 year old male who 3 days ago injected methamphetamine into his right arm.  He had 2 injection sites 1 on the dorsal aspect of the wrist and one within the antecubital fossa.  He went on to develop pain, swelling and erythema to the right wrist and forearm.  On his exam he had significant tenderness to palpation and fluctuance over the dorsal aspect of the wrist.  Additionally he had a firm nodule within the antecubital fossa.  A CT scan was obtained which showed rim-enhancing lesion over the dorsal aspect of the wrist consistent with a abscess.  We discussed treatment options together and did wish to proceed forward with irrigation debridement of the wrist and arm and he presents for that.  FINDING: There is abundant purulent material through the dorsal aspect of the wrist primarily surrounding the tendons of the fourth compartment.  Irrigation debridement of the right dorsal wrist was performed as well as extensor tenosynovectomy to the second, third, fourth and fifth dorsal compartments.  Additionally irrigation [place] of right antecubital fossa abscess was performed.  There was necrotic material surrounding the subcutaneous vein with additional purulent material.  DESCRIPTION OF PROCEDURE: Patient was identified in the preoperative holding area where the risk benefits and alternatives of the procedure were discussed with the patient.  These risks  include but are not limited to infection, bleeding, damage to surrounding structures including blood vessels and nerves, pain, stiffness, need for additional procedures.  Informed consent was obtained at that time and the patient's right arm was marked with surgical marking pen.  He was then brought back to the operative suite where timeout was performed identifying the correct patient operative site.  He was positioned supine on the operative table with his hand outstretched hand table.  He was induced under general anesthesia.  A tourniquet was placed on the upper arm and the arm was then prepped and draped in usual sterile fashion.  The limb was exsanguinated by gravity and the tourniquet was inflated.  A longitudinal incision was then made over the dorsal aspect of the wrist.  This was in line with the third ray.  Blunt dissection was carried down through the subcutaneous tissues.  Radial and ulnar skin flaps were elevated.  In doing so there was a blush of purulent material while dissecting down deep around the fourth compartment.  This fluid was cultured for both aerobic and anaerobic bacteria.  Continued dissection through the area showed significant inflammatory tissue in the subcutaneous tissue as well as purulent material surrounding the tendons to the second, third, fourth and fifth compartments.  The tenosynovium of those compartments was debrided using pickup and scissors as well as rondure until healthy appearing tendons were visualized throughout the wound.  This was all done distal to the extensor retinaculum which was left intact and not incised.  Continued dissection was then performed through the subcutaneous tissue proximal to the extensor retinaculum.  No further purulent material was visualized through this area.  The wound was milked both proximally  distally and there was no other evidence of purulence.  The wound was then copiously irrigated with normal saline via cystoscopy tubing.   Approximately a liter and a half of saline was run through the wound.  Following irrigation the wound was once again inspected.  No further purulent material was appreciated within the wound.  There is no evidence of the infection tracking deep to the wrist joint itself.  At this point attention was then turned to the antecubital fossa.  There was a 3 cm firm lesion in the deep soft tissues.  A longitudinal incision was made just over top of this which was just distal to the elbow flexion crease.  Blunt dissection was carried down through the subcutaneous tissues.  There was a prominent vein within this area which had necrotic material surrounding it as well as associated purulence.  This area was debrided with a rondure taking care to protect the surrounding vascular structures.  The vein itself was firm and appeared to be clotted off but in dissection of the vein it did begin bleeding.  It was tied off proximally distally with multiple 3-0 silk ties.  This wound was also cultured for both aerobic and anaerobic material.  Blunt dissection was continued through the wound down deeply.  There was some infected appearing material which extended deep all the way down to the biceps tendon. Once all necrotic and infected appearing material was debrided and removed, the wound was copiously irrigated with a liter and a half normal saline via cystoscopy tubing.  No further purulence was appreciated.  At this point [place] drains were placed in both wounds and the wounds were loosely closed over top of the drains.  Xeroform, 4 x 4's and well-padded soft bulky dressing were placed on the arm.  The tourniquet was released and the patient had return of brisk capillary refill to all of his digits.  He was awoken from his anesthesia and extubated in the operating room without any complications.  He was taken to the PACU in stable condition.  ESTIMATED BLOOD LOSS: 40 mL  TOURNIQUET TIME: 46 minutes  SPECIMENS: Aerobic and  anaerobic cultures x2  POSTOPERATIVE PLAN: Patient will be transferred back to the inpatient unit for continued care under the hospitalist service.  We will monitor his clinical progress going forward including his culture results.  We will plan on pulling his drains in approximately 1 to 2 days pending his clinical improvement.  He can begin early wrist and digit as well as elbow range of motion as tolerated.  IMPLANTS: None

## 2020-02-20 NOTE — Anesthesia Postprocedure Evaluation (Signed)
Anesthesia Post Note  Patient: Jason Wolf  Procedure(s) Performed: IRRIGATION AND DEBRIDEMENT HAND (Right Hand)     Patient location during evaluation: PACU Anesthesia Type: General Level of consciousness: awake and alert Pain management: pain level controlled Vital Signs Assessment: post-procedure vital signs reviewed and stable Respiratory status: spontaneous breathing, nonlabored ventilation, respiratory function stable and patient connected to nasal cannula oxygen Cardiovascular status: blood pressure returned to baseline and stable Postop Assessment: no apparent nausea or vomiting Anesthetic complications: no   No complications documented.  Last Vitals:  Vitals:   02/20/20 1340 02/20/20 1356  BP: (!) 148/76 (!) 141/88  Pulse: 85 92  Resp: 14 18  Temp: 37 C   SpO2: 94% 90%    Last Pain:  Vitals:   02/20/20 1356  TempSrc: Oral  PainSc:                  Mariyam Remington P Ceaira Ernster

## 2020-02-20 NOTE — Consult Note (Addendum)
ORTHOPAEDIC CONSULTATION  REQUESTING PHYSICIAN: Ghimire, Werner Lean, MD  PCP:  Patient, No Pcp Per  Chief Complaint: Right hand swelling and erythema  HPI: Jason Wolf is a 39 y.o. male who complains of right hand swelling and erythema.  3 days ago he had a relapse and used intravenous methamphetamines.  Over the past 1 to 2 days he has noticed increased pain and swelling to his right hand and arm.  He had subjective low-grade fevers at the onset as well.  He was seen at [facility] where he was found to have cellulitis of his right hand and forearm.  A subsequent CT scan of the right upper extremity was obtained which showed questionable fluid collection of the dorsal aspect of the wrist.  He was transferred to [facility] and admitted under the hospitalist service.  Hand surgery was consulted for further recommendations and treatment options.  He states that his pain has had no significant change since starting IV antibiotics here at [facility].  He denies numbness or tingling to the fingers.  He is remained n.p.o.  Past Medical History:  Diagnosis Date  . Bipolar disorder (HCC)   . Diabetes mellitus without complication (HCC)   . ETOH abuse   . Hypertension   . Opiate abuse, episodic [facility])    Past Surgical History:  Procedure Laterality Date  . KNEE ARTHROSCOPY     Social History   Socioeconomic History  . Marital status: Single    Spouse name: Not on file  . Number of children: Not on file  . Years of education: Not on file  . Highest education level: Not on file  Occupational History  . Not on file  Tobacco Use  . Smoking status: Current Every Day Smoker    Packs/day: 1.00  . Smokeless tobacco: Never Used  Vaping Use  . Vaping Use: Every day  Substance and Sexual Activity  . Alcohol use: No    Comment: in rehab  . Drug use: Yes    Types: Methamphetamines    Comment: was in rehab and relapsed after 22 months. 02/19/20  . Sexual activity: Not on file    Other Topics Concern  . Not on file  Social History Narrative  . Not on file   Social Determinants of Health   Financial Resource Strain:   . Difficulty of Paying Living Expenses:   Food Insecurity:   . Worried About Programme researcher, broadcasting/film/video in the Last Year:   . Barista in the Last Year:   Transportation Needs:   . Freight forwarder (Medical):   Marland Kitchen Lack of Transportation (Non-Medical):   Physical Activity:   . Days of Exercise per Week:   . Minutes of Exercise per Session:   Stress:   . Feeling of Stress :   Social Connections:   . Frequency of Communication with Friends and Family:   . Frequency of Social Gatherings with Friends and Family:   . Attends Religious Services:   . Active Member of Clubs or Organizations:   . Attends Banker Meetings:   Marland Kitchen Marital Status:    Family History  Problem Relation Age of Onset  . Diabetes type II Mother    Allergies  Allergen Reactions  . Codeine Nausea And Vomiting   Prior to Admission medications   Medication Sig Start Date End Date Taking? Authorizing Provider  hydrOXYzine (VISTARIL) 25 MG capsule Take 25 mg by mouth every 6 (  six) hours as needed for anxiety.  01/23/20  Yes [provider]  naproxen (NAPROSYN) 250 MG tablet Take 250 mg by mouth 2 (two) times daily as needed for moderate pain or headache.    Yes [provider]  sertraline (ZOLOFT) 50 MG tablet Take 50 mg by mouth daily. 01/26/20  Yes [provider]  divalproex (DEPAKOTE) 500 MG DR tablet Take 1,000 mg by mouth 2 (two) times daily.     [provider]  fenofibrate (TRICOR) 145 MG tablet TAKE ONE TABLET BY MOUTH EVERY DAY Patient not taking: Reported on 02/20/2020 11/01/15   Michiel Cowboy A, PA-C  hydrochlorothiazide (MICROZIDE) 12.5 MG capsule Take 12.5 mg by mouth daily. Reported on 02/05/2016    [provider]  naproxen (NAPROSYN) 500 MG tablet Take 1 tablet (500 mg total) by mouth 2 (two) times  daily. Patient not taking: Reported on 02/20/2020 07/27/19   Bethel Born, PA-C   DG Chest 2 View  Result Date: 02/19/2020 CLINICAL DATA:  Infection, IV drug use EXAM: CHEST - 2 VIEW COMPARISON:  04/20/2017 FINDINGS: The heart size and mediastinal contours are within normal limits. Both lungs are clear. The visualized skeletal structures are unremarkable. IMPRESSION: No active cardiopulmonary disease. Electronically Signed   By: Helyn Numbers MD   On: 02/19/2020 21:56   CT Extrem Up Entire Arm R W/CM  Result Date: 02/20/2020 CLINICAL DATA:  Soft tissue infection.  Cellulitis.  Forearm DVT. EXAM: CT OF THE UPPER RIGHT EXTREMITY WITH CONTRAST TECHNIQUE: Multidetector CT imaging of the upper right extremity was performed according to the standard protocol following intravenous contrast administration. COMPARISON:  None. CONTRAST:  OMNIPAQUE IOHEXOL 300 MG/ML  SOLN FINDINGS: Bones/Joint/Cartilage There is no acute displaced fracture. No CT evidence for osteomyelitis. Ligaments Suboptimally assessed by CT. Muscles and Tendons No large intramuscular hematoma. Soft tissues There are mildly enlarged right axillary lymph nodes. There is subcutaneous edema involving the patient's forearm. There is associated skin thickening. There are no pockets of subcutaneous gas. There is extensive soft tissue swelling about the patient's dorsal right hand with findings suspicious for a developing abscess within the dorsal subcutaneous soft tissues at the level of the wrist (axial series 8, image 672 and coronal series 9, image 112). This collection measures approximately 2 by 0.6 by 2.4 cm. This collection is suboptimally evaluated secondary to extensive streak artifact. IMPRESSION: 1. No acute osseous abnormality. 2. There is subcutaneous soft tissue edema involving the patient's forearm and hand. This is consistent with cellulitis in the appropriate clinical setting. 3. Findings are suspicious for a small  subcutaneous fluid collection involving the dorsal soft tissues at the level of the patient's wrist. This is concerning for developing abscess. This finding is suboptimally evaluated secondary to extensive streak artifact. A dedicated ultrasound would be useful for further evaluation. 4. Mildly enlarged, likely reactive lymph nodes in the right axilla. Electronically Signed   By: Katherine Mantle M.D.   On: 02/20/2020 01:02    Positive ROS: All other systems have been reviewed and were otherwise negative with the exception of those mentioned in the HPI and as above.  Physical Exam: General: Alert, no acute distress Cardiovascular: Right hand and upper extremity edema Respiratory: No cyanosis, no use of accessory musculature Skin: No lesions in the area of chief complaint  Psychiatric: Patient is competent for consent with normal mood and affect  MUSCULOSKELETAL: Examination of the right upper extremity shows some mild erythema to the dorsal aspect of the  wrist as well as in the antecubital fossa.  The hand is swollen.  He is tenderness to palpation along the dorsal aspect of the wrist primarily over the fourth compartment.  There is some subtle fluctuance within this area.  He has intact but painful digit range of motion and full range of motion is limited secondary to pain.  He also has significant pain with attempted wrist flexion and extension with only about 20 degrees of motion in both directions.  Additionally he has to prominent nodules in the antecubital fossa.  These areas are nontender but there is some subtle surrounding erythema.  There is no fluctuance to these areas.  He has full elbow range of motion which is pain-free.  His fingertips are warm well perfused with brisk capillary refill.  His sensation is grossly intact to light touch throughout all digits.  Assessment: Right hand and wrist infection  Plan: I reviewed the patient's CT scan today.  The CT scan does show a  rim-enhancing fluid collection over the dorsal aspect of the wrist.  This coincides with the patient's area of maximal tenderness.    I discussed with him treatment options including a trial of continued IV antibiotics or proceeding forward with irrigation debridement of his right dorsal wrist.  I do think proceeding forward with an irrigation and debridement of the right dorsal wrist would be the best option for him as he has shown no improvement significantly following course of IV antibiotics.  Additionally cultures could be taken to further identify the the bacterial species.  The risks of surgery were discussed with him which include but are not limited to continued infection, bleeding, damage to surrounding structures including blood vessels and nerves, pain, stiffness, recurrence and need for additional procedures.  After having this discussion he does wish to proceed forward with surgery.   I think continued clinical observation of the 2 antecubital lesions would be advisable as he has no tenderness to this area and I see no evidence of rim-enhancing lesions or fluid collections on his CT scan.  The patient's right wrist was marked and informed consent was obtained.    Plan for continued care under the hospitalist service following the procedure.    Ernest Mallick, MD [phone]   02/20/2020 9:47 AM

## 2020-02-20 NOTE — H&P (Signed)
History and Physical    Jason Wolf VOJ:[id number] DOB: November 25, 1980 DOA: 02/19/2020  PCP: Patient, No Pcp Per  Patient coming from: Home  I have personally briefly reviewed patient's old medical records in [facility]  Chief Complaint: Arm pain  HPI: Jason Wolf is a 39 y.o. male with medical history significant of DM2, HTN, polysubstance abuse.  Pt reports he had been clean for 22 months but then had relapse, used IV methamphetamine x3 days ago.  Today developed erythema, edema, pain to R hand and forearm with a knot in R antecubital fossa.  Limited ROM of fingers and wrist due to swelling and pain.  Does admit that other than zoloft and one other med he is not taking any DM2 nor HTN meds anymore nor seeing a PCP regularly.   ED Course: Tm 100 in ED, WBC 15k.  CT of forearm shows possible abscess in R wrist as well as cellulitis.  Started on vancomycin by EDP  EDP note states he spoke with Dr. Roney Mans for hand surgery.   Review of Systems: As per HPI, otherwise all review of systems negative.  Past Medical History:  Diagnosis Date   Bipolar disorder (HCC)    Diabetes mellitus without complication (HCC)    ETOH abuse    Hypertension    Opiate abuse, episodic (HCC)     Past Surgical History:  Procedure Laterality Date   KNEE ARTHROSCOPY       reports that he has been smoking. He has been smoking about 1.00 pack per day. He has never used smokeless tobacco. He reports current drug use. Drug: Methamphetamines. He reports that he does not drink alcohol.  Allergies  Allergen Reactions   Codeine Nausea And Vomiting    Family History  Problem Relation Age of Onset   Diabetes type II Mother      Prior to Admission medications   Medication Sig Start Date End Date Taking? Authorizing Provider  citalopram (CELEXA) 40 MG tablet Take 40 mg by mouth at bedtime.     [provider]  diphenhydrAMINE (BENADRYL) 25 mg capsule Take 50 mg by mouth every  6 (six) hours as needed for itching, allergies or sleep.    [provider]  divalproex (DEPAKOTE) 500 MG DR tablet Take 1,000 mg by mouth 2 (two) times daily.     [provider]  fenofibrate (TRICOR) 145 MG tablet TAKE ONE TABLET BY MOUTH EVERY DAY 11/01/15   McGowan, Carollee Herter A, PA-C  hydrochlorothiazide (MICROZIDE) 12.5 MG capsule Take 12.5 mg by mouth daily. Reported on 02/05/2016    [provider]  hydrOXYzine (VISTARIL) 25 MG capsule TAKE 1 CAPSULE BY MOUTH FOUR TIMES DAILY AS NEEDED FOR ANXIETY 01/23/20   [provider]  lisinopril (PRINIVIL,ZESTRIL) 40 MG tablet Take 40 mg by mouth daily.    [provider]  metFORMIN (GLUCOPHAGE) 1000 MG tablet Take 1,000 mg by mouth 2 (two) times daily with a meal.    [provider]  naproxen (NAPROSYN) 250 MG tablet Take 250 mg by mouth 2 (two) times daily as needed for moderate pain or headache.     [provider]  naproxen (NAPROSYN) 500 MG tablet Take 1 tablet (500 mg total) by mouth 2 (two) times daily. 07/27/19   Bethel Born, PA-C  sertraline (ZOLOFT) 50 MG tablet Take 50 mg by mouth daily. 01/26/20   [provider]    Physical Exam: Vitals:   02/19/20 2114 02/20/20 0021 02/20/20 0230 02/20/20  0433  BP:  (!) 130/74 (!) 134/82 (!) 132/78  Pulse:  95 90 82  Resp:  17 15 18   Temp:  99.5 F (37.5 C)  98.9 F (37.2 C)  TempSrc:  Oral  Oral  SpO2:  99% 98% 98%  Weight: (!) 117.9 kg     Height: 6' (1.829 m)       Constitutional: NAD, calm, comfortable Eyes: PERRL, lids and conjunctivae normal ENMT: Mucous membranes are moist. Posterior pharynx clear of any exudate or lesions.Normal dentition.  Neck: normal, supple, no masses, no thyromegaly Respiratory: clear to auscultation bilaterally, no wheezing, no crackles. Normal respiratory effort. No accessory muscle use.  Cardiovascular: Regular rate and rhythm, no murmurs / rubs / gallops. No extremity edema. 2+ pedal  pulses. No carotid bruits.  Abdomen: no tenderness, no masses palpated. No hepatosplenomegaly. Bowel sounds positive.  Musculoskeletal: no clubbing / cyanosis. No joint deformity upper and lower extremities. Good ROM, no contractures. Normal muscle tone.  Skin:    Neurologic: CN 2-12 grossly intact. Sensation intact, DTR normal. Strength 5/5 in all 4.  Psychiatric: Normal judgment and insight. Alert and oriented x 3. Normal mood.    Labs on Admission: I have personally reviewed following labs and imaging studies  CBC: Recent Labs  Lab 02/19/20 2129  WBC 15.5*  NEUTROABS 11.9*  HGB 14.0  HCT 39.1  MCV 83.9  PLT 335   Basic Metabolic Panel: Recent Labs  Lab 02/19/20 2129  NA 130*  K 3.9  CL 98  CO2 20*  GLUCOSE 333*  BUN 10  CREATININE 0.50*  CALCIUM 8.6*   GFR: Estimated Creatinine Clearance: 164.3 mL/min (A) (by C-G formula based on SCr of 0.5 mg/dL (L)). Liver Function Tests: Recent Labs  Lab 02/19/20 2129  AST 18  ALT 25  ALKPHOS 77  BILITOT 0.7  PROT 6.7  ALBUMIN 3.7   No results for input(s): LIPASE, AMYLASE in the last 168 hours. No results for input(s): AMMONIA in the last 168 hours. Coagulation Profile: No results for input(s): INR, PROTIME in the last 168 hours. Cardiac Enzymes: No results for input(s): CKTOTAL, CKMB, CKMBINDEX, TROPONINI in the last 168 hours. BNP (last 3 results) No results for input(s): PROBNP in the last 8760 hours. HbA1C: No results for input(s): HGBA1C in the last 72 hours. CBG: Recent Labs  Lab 02/20/20 0511  GLUCAP 238*   Lipid Profile: No results for input(s): CHOL, HDL, LDLCALC, TRIG, CHOLHDL, LDLDIRECT in the last 72 hours. Thyroid Function Tests: No results for input(s): TSH, T4TOTAL, FREET4, T3FREE, THYROIDAB in the last 72 hours. Anemia Panel: No results for input(s): VITAMINB12, FOLATE, FERRITIN, TIBC, IRON, RETICCTPCT in the last 72 hours. Urine analysis:    Component Value Date/Time   COLORURINE Straw  11/10/2014 1132   APPEARANCEUR Clear 11/10/2014 1132   LABSPEC 1.011 11/10/2014 1132   PHURINE 6.0 11/10/2014 1132   GLUCOSEU Negative 11/10/2014 1132   HGBUR Negative 11/10/2014 1132   BILIRUBINUR Negative 11/10/2014 1132   KETONESUR Negative 11/10/2014 1132   PROTEINUR Negative 11/10/2014 1132   NITRITE Negative 11/10/2014 1132   LEUKOCYTESUR Negative 11/10/2014 1132    Radiological Exams on Admission: DG Chest 2 View  Result Date: 02/19/2020 CLINICAL DATA:  Infection, IV drug use EXAM: CHEST - 2 VIEW COMPARISON:  04/20/2017 FINDINGS: The heart size and mediastinal contours are within normal limits. Both lungs are clear. The visualized skeletal structures are unremarkable. IMPRESSION: No active cardiopulmonary disease. Electronically Signed   By: Helyn NumbersAshesh  Parikh MD  On: 02/19/2020 21:56   CT Extrem Up Entire Arm R W/CM  Result Date: 02/20/2020 CLINICAL DATA:  Soft tissue infection.  Cellulitis.  Forearm DVT. EXAM: CT OF THE UPPER RIGHT EXTREMITY WITH CONTRAST TECHNIQUE: Multidetector CT imaging of the upper right extremity was performed according to the standard protocol following intravenous contrast administration. COMPARISON:  None. CONTRAST:  OMNIPAQUE IOHEXOL 300 MG/ML  SOLN FINDINGS: Bones/Joint/Cartilage There is no acute displaced fracture. No CT evidence for osteomyelitis. Ligaments Suboptimally assessed by CT. Muscles and Tendons No large intramuscular hematoma. Soft tissues There are mildly enlarged right axillary lymph nodes. There is subcutaneous edema involving the patient's forearm. There is associated skin thickening. There are no pockets of subcutaneous gas. There is extensive soft tissue swelling about the patient's dorsal right hand with findings suspicious for a developing abscess within the dorsal subcutaneous soft tissues at the level of the wrist (axial series 8, image 672 and coronal series 9, image 112). This collection measures approximately 2 by 0.6 by 2.4 cm.  This collection is suboptimally evaluated secondary to extensive streak artifact. IMPRESSION: 1. No acute osseous abnormality. 2. There is subcutaneous soft tissue edema involving the patient's forearm and hand. This is consistent with cellulitis in the appropriate clinical setting. 3. Findings are suspicious for a small subcutaneous fluid collection involving the dorsal soft tissues at the level of the patient's wrist. This is concerning for developing abscess. This finding is suboptimally evaluated secondary to extensive streak artifact. A dedicated ultrasound would be useful for further evaluation. 4. Mildly enlarged, likely reactive lymph nodes in the right axilla. Electronically Signed   By: Katherine Mantle M.D.   On: 02/20/2020 01:02    EKG: Independently reviewed.  Assessment/Plan Principal Problem:   Cellulitis of right arm Active Problems:   Diabetes (HCC)   Essential hypertension   Bipolar 2 disorder, major depressive episode (HCC)   IVDU (intravenous drug user)   Polysubstance abuse (HCC)   Cutaneous abscess of right wrist    1. Cellulitis and abscess of R forearm, wrist, and hand - 1. Cellulitis pathway 2. Will leave vanc alone for the moment 1. Checking MRSA PCR nares 3. Touch base with hand surgery in AM 4. Will keep pt NPO for possible OR drainage 5. Repeat CBC/BMP tomorrow AM 6. Norco PRN pain for the moment 2. DM2 - 1. Sensitive SSI Q4H 3. HTN - 1. Not currently taking any of his previous home meds 2. Monitor for the moment 4. BPD - 1. Resume home meds when med-rec completed 5. Polysubstance abuse - 1. SW consult  DVT prophylaxis: SCDs - possible OR I+D Code Status: Full Family Communication: No family in room Disposition Plan: Home after cellulitis treated Consults called: EDP spoke with Dr. Roney Mans Admission status: Admit to inpatient  Severity of Illness: The appropriate patient status for this patient is INPATIENT. Inpatient status is judged to be  reasonable and necessary in order to provide the required intensity of service to ensure the patient's safety. The patient's presenting symptoms, physical exam findings, and initial radiographic and laboratory data in the context of their chronic comorbidities is felt to place them at high risk for further clinical deterioration. Furthermore, it is not anticipated that the patient will be medically stable for discharge from the hospital within 2 midnights of admission. The following factors support the patient status of inpatient.   IP status for cellulitis and abscess of forearm, wrist, and hand.  Requiring IV ABx, I suspect pt may also require surgical  I+D.  * I certify that at the point of admission it is my clinical judgment that the patient will require inpatient hospital care spanning beyond 2 midnights from the point of admission due to high intensity of service, high risk for further deterioration and high frequency of surveillance required.*    Jason Wolf M. DO [facility]  How to contact the [facility] Attending or Consulting provider 7A - 7P or covering provider during after hours 7P -7A, for this patient?  1. Check the care team in [facility] and look for a) attending/consulting [facility] provider listed and b) the [facility] team listed 2. Log into [email or web address]  Amion Physician Scheduling and messaging for groups and whole hospitals  On call and physician scheduling software for group practices, residents, hospitalists and other medical providers for call, clinic, rotation and shift schedules. On[employer] is a hospital-wide system for scheduling doctors and paging doctors on call. EasyPlot is for scientific plotting and data analysis.  [email or web address]  and use [facility]'s universal password to access. If you do not have the password, please contact the hospital operator.  3. Locate the [facility] provider you are looking for under [facility] and page to a number that you can be directly  reached. 4. If you still have difficulty reaching the provider, please page the [facility] (Director on Call) for the Hospitalists listed on amion for assistance.  02/20/2020, 5:17 AM

## 2020-02-20 NOTE — Progress Notes (Signed)
New Admission Note:   Arrival Method: [facility] from [facility] Mental Orientation:  A & O x 4 Telemetry: None Assessment: Completed Skin:  Cellulitis to Right lower arm - borders marked by marker IV:  Left wrist NSL Pain:  C/o RUE 8/10 Tubes:  None Safety Measures: Safety Fall Prevention Plan has been given, discussed and signed Admission: Completed 5 MW Orientation: Patient has been orientated to the room, unit and staff.  Family:  None at bedside   Patient is from home with mother and father.  He no longer has his driver's license.  He has his clothes, flip flops, and 2 earrings in each ear.  Eye glasses were left at home.  Orders have been reviewed and implemented. Will continue to monitor the patient. Call light has been placed within reach and bed alarm has been activated.   Bernie Covey RN- BC, [employer]

## 2020-02-20 NOTE — Transfer of Care (Signed)
Immediate Anesthesia Transfer of Care Note  Patient: Jason Wolf  Procedure(s) Performed: IRRIGATION AND DEBRIDEMENT HAND (Right Hand)  Patient Location: PACU  Anesthesia Type:General  Level of Consciousness: drowsy  Airway & Oxygen Therapy: Patient Spontanous Breathing and Patient connected to nasal cannula oxygen  Post-op Assessment: Report given to RN, Post -op Vital signs reviewed and stable and Patient moving all extremities X 4  Post vital signs: Reviewed and stable  Last Vitals:  Vitals Value Taken Time  BP 154/85 02/20/20 1228  Temp    Pulse 97 02/20/20 1229  Resp 21 02/20/20 1229  SpO2 91 % 02/20/20 1229  Vitals shown include unvalidated device data.  Last Pain:  Vitals:   02/20/20 1000  TempSrc: Oral  PainSc:       Patients Stated Pain Goal: 0 (02/20/20 0615)  Complications: No complications documented.

## 2020-02-20 NOTE — Anesthesia Preprocedure Evaluation (Addendum)
Anesthesia Evaluation  Patient identified by MRN, date of birth, ID band Patient awake    Reviewed: Allergy & Precautions, NPO status , Patient's Chart, lab work & pertinent test results  Airway Mallampati: I  TM Distance: >3 FB Neck ROM: Full    Dental  (+) Missing   Pulmonary Current Smoker and Patient abstained from smoking.,    Pulmonary exam normal breath sounds clear to auscultation       Cardiovascular hypertension, Pt. on medications Normal cardiovascular exam Rhythm:Regular Rate:Normal     Neuro/Psych PSYCHIATRIC DISORDERS Bipolar Disorder negative neurological ROS     GI/Hepatic negative GI ROS, (+)     substance abuse  ,   Endo/Other  negative endocrine ROSdiabetes  Renal/GU negative Renal ROS     Musculoskeletal Right hand and wrist infection   Abdominal (+) + obese,   Peds  Hematology negative hematology ROS (+)   Anesthesia Other Findings ABSCESS RIGHT ARM  Reproductive/Obstetrics                            Anesthesia Physical Anesthesia Plan  ASA: III  Anesthesia Plan: General   Post-op Pain Management:    Induction: Intravenous  PONV Risk Score and Plan: 2 and Ondansetron, Dexamethasone, Midazolam and Treatment may vary due to age or medical condition  Airway Management Planned: LMA  Additional Equipment:   Intra-op Plan:   Post-operative Plan: Extubation in OR  Informed Consent: I have reviewed the patients History and Physical, chart, labs and discussed the procedure including the risks, benefits and alternatives for the proposed anesthesia with the patient or authorized representative who has indicated his/her understanding and acceptance.     Dental advisory given  Plan Discussed with: CRNA  Anesthesia Plan Comments:        Anesthesia Quick Evaluation

## 2020-02-20 NOTE — Progress Notes (Signed)
PROGRESS NOTE        PATIENT DETAILS Name: Jason Wolf Age: 39 y.o. Sex: male Date of Birth: Dec 06, 1980 Admit Date: 02/19/2020 Admitting Physician Carlton Adam, MD QQI:[id number], No Pcp Per  Brief Narrative: Patient is a 39 y.o. 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  Significant events: 7/23>> admit to [facility]-right hand/wrist infection  Significant studies: 7/24>> CT of right upper extremity: Subcutaneous tissue edema involving patient's forearm and right hand-consistent with cellulitis, small subcutaneous fluid collection involving the dorsal soft tissue at the level of patient's wrist-concerning for developing abscess.  Antimicrobial therapy: Vancomycin: 7/23>>  Microbiology data: 7/24>> blood culture: Pending  Procedures : None  Consults: Orthopedics  DVT Prophylaxis : SCDs Start: 02/20/20 0456   Subjective: Pain and swelling of face right wrist/right forearm appears unchanged.  Assessment/Plan: Right hand/forearm soft tissue swelling with abscess formation: Continue vancomycin-hand surgery following with plans for incision and drainage today.  DM-2 (A1c 11.0 on 02/20/2020) with uncontrolled hyperglycemia: Appears noncompliant with medications-for now continue with SSI-once his diet has been resumed in the postoperative setting-and is stable-we will likely need initiation of long-acting insulin.  Monitor closely for now  Recent Labs    02/20/20 0511 02/20/20 0720  GLUCAP 238* 200*    HTN: BP stable-not on any antihypertensives.  Monitor for now.  Bipolar disorder: Resume Depakote and Zoloft  Polysubstance abuse: Mostly IV methamphetamine use-relapsed after 22 months-consult-we will need social work evaluation prior to  discharge.  Morbid Obesity: Estimated body mass index is 35.26 kg/m as calculated from the following:   Height as of this encounter: 6' (1.829 m).   Weight as of this encounter: 117.9 kg.    Diet: Diet Order            Diet NPO time specified Except for: Sips with Meds  Diet effective now                  Code Status: Full code  Family Communication: None at bedside  Disposition Plan: Status is: Inpatient  Remains inpatient appropriate because:Inpatient level of care appropriate due to severity of illness   Dispo: The patient is from: Home              Anticipated d/c is to: Home              Anticipated d/c date is: 2 days              Patient currently is not medically stable to d/c.   Barriers to Discharge: Right hand/forearm soft tissue infection with abscess formation-on IV antibiotics-requiring I&D.  Antimicrobial agents: Anti-infectives (From admission, onward)   Start     Dose/Rate Route Frequency Ordered Stop   02/20/20 0056  vancomycin (VANCOCIN) 1-5 GM/200ML-% IVPB       Note to Pharmacy: Dortha Schwalbe   : cabinet override      02/20/20 0056 02/20/20 0310   02/20/20 0030  vancomycin (VANCOREADY) IVPB 2000 mg/400 mL        2,000 mg 200 mL/hr over 120 Minutes Intravenous  Once 02/20/20 0019 02/20/20 0719       Time spent: 25- minutes-Greater than 50% of this time was spent in  counseling, explanation of diagnosis, planning of further management, and coordination of care.  MEDICATIONS: Scheduled Meds: . insulin aspart  0-9 Units Subcutaneous Q4H  . sodium chloride flush  3 mL Intravenous Once   Continuous Infusions: PRN Meds:.acetaminophen **OR** acetaminophen, HYDROcodone-acetaminophen, HYDROmorphone (DILAUDID) injection, ondansetron **OR** ondansetron (ZOFRAN) IV   PHYSICAL EXAM: Vital signs: Vitals:   02/20/20 0021 02/20/20 0230 02/20/20 0433 02/20/20 1000  BP: (!) 130/74 (!) 134/82 (!) 132/78 (!) 133/85  Pulse: 95 90 82 76  Resp: 17 15  18 18   Temp: 99.5 F (37.5 C)  98.9 F (37.2 C) 98.2 F (36.8 C)  TempSrc: Oral  Oral Oral  SpO2: 99% 98% 98% 97%  Weight:      Height:       Filed Weights   02/19/20 2114  Weight: (!) 117.9 kg   Body mass index is 35.26 kg/m.   Gen Exam:Alert awake-not in any distress HEENT:atraumatic, normocephalic Chest: B/L clear to auscultation anteriorly CVS:S1S2 regular Abdomen:soft non tender, non distended Extremities:no ankle edema.  Right wrist/dorsum of the hand significantly swollen-very minimal passive range of motion possible at the wrist.  Some mild swelling of the forearm.  Significantly tender dorsum and wrist area. Neurology: Non focal Skin: no rash  I have personally reviewed following labs and imaging studies  LABORATORY DATA: CBC: Recent Labs  Lab 02/19/20 2129  WBC 15.5*  NEUTROABS 11.9*  HGB 14.0  HCT 39.1  MCV 83.9  PLT 335    Basic Metabolic Panel: Recent Labs  Lab 02/19/20 2129  NA 130*  K 3.9  CL 98  CO2 20*  GLUCOSE 333*  BUN 10  CREATININE 0.50*  CALCIUM 8.6*    GFR: Estimated Creatinine Clearance: 164.3 mL/min (A) (by C-G formula based on SCr of 0.5 mg/dL (L)).  Liver Function Tests: Recent Labs  Lab 02/19/20 2129  AST 18  ALT 25  ALKPHOS 77  BILITOT 0.7  PROT 6.7  ALBUMIN 3.7   No results for input(s): LIPASE, AMYLASE in the last 168 hours. No results for input(s): AMMONIA in the last 168 hours.  Coagulation Profile: No results for input(s): INR, PROTIME in the last 168 hours.  Cardiac Enzymes: No results for input(s): CKTOTAL, CKMB, CKMBINDEX, TROPONINI in the last 168 hours.  BNP (last 3 results) No results for input(s): PROBNP in the last 8760 hours.  Lipid Profile: No results for input(s): CHOL, HDL, LDLCALC, TRIG, CHOLHDL, LDLDIRECT in the last 72 hours.  Thyroid Function Tests: No results for input(s): TSH, T4TOTAL, FREET4, T3FREE, THYROIDAB in the last 72 hours.  Anemia Panel: No results for input(s):  VITAMINB12, FOLATE, FERRITIN, TIBC, IRON, RETICCTPCT in the last 72 hours.  Urine analysis:    Component Value Date/Time   COLORURINE YELLOW 02/20/2020 0640   APPEARANCEUR CLEAR 02/20/2020 0640   APPEARANCEUR Clear 11/10/2014 1132   LABSPEC >1.046 (H) 02/20/2020 0640   LABSPEC 1.011 11/10/2014 1132   PHURINE 6.0 02/20/2020 0640   GLUCOSEU >=500 (A) 02/20/2020 0640   GLUCOSEU Negative 11/10/2014 1132   HGBUR NEGATIVE 02/20/2020 0640   BILIRUBINUR NEGATIVE 02/20/2020 0640   BILIRUBINUR Negative 11/10/2014 1132   KETONESUR 20 (A) 02/20/2020 0640   PROTEINUR NEGATIVE 02/20/2020 0640   NITRITE NEGATIVE 02/20/2020 0640   LEUKOCYTESUR NEGATIVE 02/20/2020 0640   LEUKOCYTESUR Negative 11/10/2014 1132    Sepsis Labs: Lactic Acid, Venous    Component Value Date/Time   LATICACIDVEN 1.1 02/20/2020 0020    MICROBIOLOGY: Recent Results (from the past 240 hour(s))  SARS  Coronavirus 2 by RT PCR (hospital order, performed in [facility] hospital lab) Nasopharyngeal Nasopharyngeal Swab     Status: None   Collection Time: 02/20/20  3:50 AM   Specimen: Nasopharyngeal Swab  Result Value Ref Range Status   SARS Coronavirus 2 NEGATIVE NEGATIVE Final    Comment: (NOTE) SARS-CoV-2 target nucleic acids are NOT DETECTED.  The SARS-CoV-2 RNA is generally detectable in upper and lower respiratory specimens during the acute phase of infection. The lowest concentration of SARS-CoV-2 viral copies this assay can detect is 250 copies / mL. A negative result does not preclude SARS-CoV-2 infection and should not be used as the sole basis for treatment or other patient management decisions.  A negative result may occur with improper specimen collection / handling, submission of specimen other than nasopharyngeal swab, presence of viral mutation(s) within the areas targeted by this assay, and inadequate number of viral copies (<250 copies / mL). A negative result must be combined with  clinical observations, patient history, and epidemiological information.  Fact Sheet for Patients:   BoilerBrush.com.cy  Fact Sheet for Healthcare Providers: https://pope.com/  This test is not yet approved or  cleared by the [place] FDA and has been authorized for detection and/or diagnosis of SARS-CoV-2 by FDA under an Emergency Use Authorization (EUA).  This EUA will remain in effect (meaning this test can be used) for the duration of the COVID-19 declaration under Section 564(b)(1) of the Act, 21 U.S.C. section 360bbb-3(b)(1), unless the authorization is terminated or revoked sooner.  Performed at [facility], [address] [place], [place] [address]   Surgical pcr screen     Status: None   Collection Time: 02/20/20  5:43 AM   Specimen: Nasal Mucosa; Nasal Swab  Result Value Ref Range Status   MRSA, PCR NEGATIVE NEGATIVE Final   Staphylococcus aureus NEGATIVE NEGATIVE Final    Comment: (NOTE) The Xpert SA Assay (FDA approved for NASAL specimens in patients 78 years of age and older), is one component of a comprehensive surveillance program. It is not intended to diagnose infection nor to guide or monitor treatment. Performed at [facility], [address] [place], [place] [address]     RADIOLOGY STUDIES/RESULTS: DG Chest 2 View  Result Date: 02/19/2020 CLINICAL DATA:  Infection, IV drug use EXAM: CHEST - 2 VIEW COMPARISON:  04/20/2017 FINDINGS: The heart size and mediastinal contours are within normal limits. Both lungs are clear. The visualized skeletal structures are unremarkable. IMPRESSION: No active cardiopulmonary disease. Electronically Signed   By: Helyn Numbers MD   On: 02/19/2020 21:56   CT Extrem Up Entire Arm R W/CM  Result Date: 02/20/2020 CLINICAL DATA:  Soft tissue infection.  Cellulitis.  Forearm DVT. EXAM: CT OF THE UPPER RIGHT EXTREMITY WITH CONTRAST TECHNIQUE: Multidetector CT  imaging of the upper right extremity was performed according to the standard protocol following intravenous contrast administration. COMPARISON:  None. CONTRAST:  OMNIPAQUE IOHEXOL 300 MG/ML  SOLN FINDINGS: Bones/Joint/Cartilage There is no acute displaced fracture. No CT evidence for osteomyelitis. Ligaments Suboptimally assessed by CT. Muscles and Tendons No large intramuscular hematoma. Soft tissues There are mildly enlarged right axillary lymph nodes. There is subcutaneous edema involving the patient's forearm. There is associated skin thickening. There are no pockets of subcutaneous gas. There is extensive soft tissue swelling about the patient's dorsal right hand with findings suspicious for a developing abscess within the dorsal subcutaneous soft tissues at the level of the wrist (axial  series 8, image 672 and coronal series 9, image 112). This collection measures approximately 2 by 0.6 by 2.4 cm. This collection is suboptimally evaluated secondary to extensive streak artifact. IMPRESSION: 1. No acute osseous abnormality. 2. There is subcutaneous soft tissue edema involving the patient's forearm and hand. This is consistent with cellulitis in the appropriate clinical setting. 3. Findings are suspicious for a small subcutaneous fluid collection involving the dorsal soft tissues at the level of the patient's wrist. This is concerning for developing abscess. This finding is suboptimally evaluated secondary to extensive streak artifact. A dedicated ultrasound would be useful for further evaluation. 4. Mildly enlarged, likely reactive lymph nodes in the right axilla. Electronically Signed   By: Katherine Mantle M.D.   On: 02/20/2020 01:02     LOS: 0 days   Jeoffrey Massed, MD  [facility]    To contact the attending provider between 7A-7P or the covering provider during after hours 7P-7A, please log into the web site [email or web address] and access using universal [place] password for that  web site. If you do not have the password, please call the hospital operator.  02/20/2020, 10:13 AM

## 2020-02-20 NOTE — Anesthesia Procedure Notes (Signed)
Procedure Name: LMA Insertion Date/Time: 02/20/2020 11:10 AM Performed by: Quentin Ore, CRNA Pre-anesthesia Checklist: Patient identified, Emergency Drugs available, Suction available and Patient being monitored Patient Re-evaluated:Patient Re-evaluated prior to induction Oxygen Delivery Method: Circle system utilized Preoxygenation: Pre-oxygenation with 100% oxygen Induction Type: IV induction LMA: LMA inserted LMA Size: 5.0 Number of attempts: 1 Placement Confirmation: positive ETCO2 and breath sounds checked- equal and bilateral Dental Injury: Teeth and Oropharynx as per pre-operative assessment

## 2020-02-20 NOTE — Progress Notes (Signed)
Pharmacy Antibiotic Note  Jason Wolf is a 39 y.o. male admitted on 02/19/2020 with cellulitis.  Pharmacy has been consulted for vancomycin dosing.  Plan: Vancomycin 2gm IV x 1 then 1gm IV q8 hours F/u renal function, cultures and clinical course  Height: 6' (182.9 cm) Weight: (!) 117.9 kg (260 lb) IBW/kg (Calculated) : 77.6  Temp (24hrs), Avg:99.8 F (37.7 C), Min:99.5 F (37.5 C), Max:100 F (37.8 C)  Recent Labs  Lab 02/19/20 2129  WBC 15.5*  CREATININE 0.50*  LATICACIDVEN 0.9    Estimated Creatinine Clearance: 164.3 mL/min (A) (by C-G formula based on SCr of 0.5 mg/dL (L)).    Allergies  Allergen Reactions  . Codeine Nausea And Vomiting    Thank you for allowing pharmacy to be a part of this patient's care.  Talbert Cage Poteet 02/20/2020 12:25 AM

## 2020-02-21 ENCOUNTER — Encounter (HOSPITAL_COMMUNITY): Payer: Self-pay | Admitting: Orthopaedic Surgery

## 2020-02-21 LAB — CBC
HCT: 37.4 % — ABNORMAL LOW (ref 39.0–52.0)
Hemoglobin: 12.8 g/dL — ABNORMAL LOW (ref 13.0–17.0)
MCH: 29.2 pg (ref 26.0–34.0)
MCHC: 34.2 g/dL (ref 30.0–36.0)
MCV: 85.2 fL (ref 80.0–100.0)
Platelets: 317 10*3/uL (ref 150–400)
RBC: 4.39 MIL/uL (ref 4.22–5.81)
RDW: 11.8 % (ref 11.5–15.5)
WBC: 16.6 10*3/uL — ABNORMAL HIGH (ref 4.0–10.5)
nRBC: 0 % (ref 0.0–0.2)

## 2020-02-21 LAB — BASIC METABOLIC PANEL
Anion gap: 9 (ref 5–15)
BUN: 8 mg/dL (ref 6–20)
CO2: 27 mmol/L (ref 22–32)
Calcium: 9 mg/dL (ref 8.9–10.3)
Chloride: 98 mmol/L (ref 98–111)
Creatinine, Ser: 0.67 mg/dL (ref 0.61–1.24)
GFR calc Af Amer: >60 mL/min (ref >60)
GFR calc non Af Amer: >60 mL/min (ref >60)
Glucose, Bld: 230 mg/dL — ABNORMAL HIGH (ref 70–99)
Potassium: 3.9 mmol/L (ref 3.5–5.1)
Sodium: 134 mmol/L — ABNORMAL LOW (ref 135–145)

## 2020-02-21 LAB — GLUCOSE, CAPILLARY
Glucose-Capillary: 212 mg/dL — ABNORMAL HIGH (ref 70–99)
Glucose-Capillary: 221 mg/dL — ABNORMAL HIGH (ref 70–99)
Glucose-Capillary: 248 mg/dL — ABNORMAL HIGH (ref 70–99)
Glucose-Capillary: 270 mg/dL — ABNORMAL HIGH (ref 70–99)
Glucose-Capillary: 273 mg/dL — ABNORMAL HIGH (ref 70–99)
Glucose-Capillary: 277 mg/dL — ABNORMAL HIGH (ref 70–99)

## 2020-02-21 MED ORDER — INSULIN GLARGINE 100 UNIT/ML ~~LOC~~ SOLN
18.0000 [IU] | Freq: Every day | SUBCUTANEOUS | Status: DC
  Administered 2020-02-21 – 2020-02-22 (×2): 18 [IU] via SUBCUTANEOUS
  Filled 2020-02-21 (×2): qty 0.18

## 2020-02-21 MED ORDER — INSULIN ASPART 100 UNIT/ML ~~LOC~~ SOLN
0.0000 [IU] | Freq: Three times a day (TID) | SUBCUTANEOUS | Status: DC
  Administered 2020-02-22 – 2020-02-23 (×4): 5 [IU] via SUBCUTANEOUS

## 2020-02-21 MED ORDER — INSULIN ASPART 100 UNIT/ML ~~LOC~~ SOLN
4.0000 [IU] | Freq: Three times a day (TID) | SUBCUTANEOUS | Status: DC
  Administered 2020-02-21 – 2020-02-22 (×3): 4 [IU] via SUBCUTANEOUS

## 2020-02-21 NOTE — Progress Notes (Signed)
PROGRESS NOTE        PATIENT DETAILS Name: Jason Wolf Age: 39 y.o. Sex: male Date of Birth: 09/20/1980 Admit Date: 02/19/2020 Admitting Physician Carlton Adam, MD ZOX:[id number], No Pcp Per  Brief Narrative: Patient is a 39 y.o. 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  Significant events: 7/23>> admit to [facility]-right hand/wrist infection  Significant studies: 7/24>> CT of right upper extremity: Subcutaneous tissue edema involving patient's forearm and right hand-consistent with cellulitis, small subcutaneous fluid collection involving the dorsal soft tissue at the level of patient's wrist-concerning for developing abscess.  Antimicrobial therapy: Vancomycin: 7/23>>  Microbiology data: 7/24>> blood culture: No growth 7/24>> abscess right hand/antecubital fossa culture: Pending  Procedures : 7/24>> irrigation/debridement of right dorsal wrist abscess, right antecubital fossa abscess, extensor Tina synovectomy of the second, third, fourth and fifth dorsal compartments.  Consults: Orthopedics  DVT Prophylaxis : SCDs Start: 02/20/20 0456  Subjective: Complains of pain at the operative site-otherwise no major events overnight.  Assessment/Plan: Right hand/forearm soft tissue swelling with abscess formation: Continue vancomycin-patient is s/p irrigation and debridement by orthopedics on 7/24.  Await intraoperative cultures-blood cultures remains negative so far.  Hand surgery following and directing care.  DM-2 (A1c 11.0 on 02/20/2020) with uncontrolled hyperglycemia: Appears noncompliant with medications-start Lantus 18 units daily, 4 units of NovoLog with meals and SSI.  Will follow and optimize accordingly over the next few days.  Recent Labs     02/21/20 0434 02/21/20 0727 02/21/20 1108  GLUCAP 221* 212* 277*    HTN: BP stable-not on any antihypertensives.  Monitor for now.  Bipolar disorder: Resume Depakote and Zoloft  Polysubstance abuse: Mostly IV methamphetamine use-relapsed after 22 months-consult-we will need social work evaluation prior to discharge.  Morbid Obesity: Estimated body mass index is 35.26 kg/m as calculated from the following:   Height as of this encounter: 6' (1.829 m).   Weight as of this encounter: 117.9 kg.    Diet: Diet Order            Diet Carb Modified Fluid consistency: Thin; Room service appropriate? Yes  Diet effective now                  Code Status: Full code  Family Communication: None at bedside  Disposition Plan: Status is: Inpatient  Remains inpatient appropriate because:Inpatient level of care appropriate due to severity of illness   Dispo: The patient is from: Home              Anticipated d/c is to: Home              Anticipated d/c date is: 2 days              Patient currently is not medically stable to d/c.   Barriers to Discharge: Right hand/forearm soft tissue infection with abscess formation-on IV antibiotics-requiring I&D.  Antimicrobial agents: Anti-infectives (From admission, onward)   Start     Dose/Rate Route Frequency Ordered Stop   02/20/20 1100  vancomycin (VANCOCIN) IVPB 1000 mg/200 mL premix     Discontinue     1,000 mg 200 mL/hr over 60 Minutes Intravenous Every 8 hours 02/20/20 1037     02/20/20  0056  vancomycin (VANCOCIN) 1-5 GM/200ML-% IVPB       Note to Pharmacy: Ronnald Ramp, Shaun   : cabinet override      02/20/20 0056 02/20/20 0310   02/20/20 0030  vancomycin (VANCOREADY) IVPB 2000 mg/400 mL        2,000 mg 200 mL/hr over 120 Minutes Intravenous  Once 02/20/20 0019 02/20/20 0719       Time spent: 25- minutes-Greater than 50% of this time was spent in counseling, explanation of diagnosis, planning of further management, and  coordination of care.  MEDICATIONS: Scheduled Meds: . insulin aspart  0-9 Units Subcutaneous Q4H  . sodium chloride flush  3 mL Intravenous Once   Continuous Infusions: . vancomycin 1,000 mg (02/21/20 0550)   PRN Meds:.acetaminophen **OR** acetaminophen, HYDROcodone-acetaminophen, HYDROmorphone (DILAUDID) injection, ondansetron **OR** ondansetron (ZOFRAN) IV   PHYSICAL EXAM: Vital signs: Vitals:   02/20/20 1645 02/20/20 2048 02/21/20 0434 02/21/20 0914  BP: (!) 129/83 (!) 101/62 119/78 120/79  Pulse: 84 82 74 82  Resp: 18 18 18 18   Temp: 98.4 F (36.9 C) 98 F (36.7 C) 97.8 F (36.6 C) 98.2 F (36.8 C)  TempSrc:   Oral Oral  SpO2: 90% 93% 96% 93%  Weight:      Height:       Filed Weights   02/19/20 2114  Weight: (!) 117.9 kg   Body mass index is 35.26 kg/m.   Gen Exam:Alert awake-not in any distress HEENT:atraumatic, normocephalic Chest: B/L clear to auscultation anteriorly CVS:S1S2 regular Abdomen:soft non tender, non distended Extremities:no edema.  Right hand in dressing-did not open. Neurology: Non focal Skin: no rash  I have personally reviewed following labs and imaging studies  LABORATORY DATA: CBC: Recent Labs  Lab 02/19/20 2129 02/21/20 0447  WBC 15.5* 16.6*  NEUTROABS 11.9*  --   HGB 14.0 12.8*  HCT 39.1 37.4*  MCV 83.9 85.2  PLT 335 317    Basic Metabolic Panel: Recent Labs  Lab 02/19/20 2129 02/21/20 0447  NA 130* 134*  K 3.9 3.9  CL 98 98  CO2 20* 27  GLUCOSE 333* 230*  BUN 10 8  CREATININE 0.50* 0.67  CALCIUM 8.6* 9.0    GFR: Estimated Creatinine Clearance: 164.3 mL/min (by C-G formula based on SCr of 0.67 mg/dL).  Liver Function Tests: Recent Labs  Lab 02/19/20 2129  AST 18  ALT 25  ALKPHOS 77  BILITOT 0.7  PROT 6.7  ALBUMIN 3.7   No results for input(s): LIPASE, AMYLASE in the last 168 hours. No results for input(s): AMMONIA in the last 168 hours.  Coagulation Profile: No results for input(s): INR, PROTIME  in the last 168 hours.  Cardiac Enzymes: No results for input(s): CKTOTAL, CKMB, CKMBINDEX, TROPONINI in the last 168 hours.  BNP (last 3 results) No results for input(s): PROBNP in the last 8760 hours.  Lipid Profile: No results for input(s): CHOL, HDL, LDLCALC, TRIG, CHOLHDL, LDLDIRECT in the last 72 hours.  Thyroid Function Tests: No results for input(s): TSH, T4TOTAL, FREET4, T3FREE, THYROIDAB in the last 72 hours.  Anemia Panel: No results for input(s): VITAMINB12, FOLATE, FERRITIN, TIBC, IRON, RETICCTPCT in the last 72 hours.  Urine analysis:    Component Value Date/Time   COLORURINE YELLOW 02/20/2020 0640   APPEARANCEUR CLEAR 02/20/2020 0640   APPEARANCEUR Clear 11/10/2014 1132   LABSPEC >1.046 (H) 02/20/2020 0640   LABSPEC 1.011 11/10/2014 1132   PHURINE 6.0 02/20/2020 0640   GLUCOSEU >=500 (A) 02/20/2020 0640   GLUCOSEU Negative  11/10/2014 1132   HGBUR NEGATIVE 02/20/2020 0640   BILIRUBINUR NEGATIVE 02/20/2020 0640   BILIRUBINUR Negative 11/10/2014 1132   KETONESUR 20 (A) 02/20/2020 0640   PROTEINUR NEGATIVE 02/20/2020 0640   NITRITE NEGATIVE 02/20/2020 0640   LEUKOCYTESUR NEGATIVE 02/20/2020 0640   LEUKOCYTESUR Negative 11/10/2014 1132    Sepsis Labs: Lactic Acid, Venous    Component Value Date/Time   LATICACIDVEN 1.1 02/20/2020 0020    MICROBIOLOGY: Recent Results (from the past 240 hour(s))  Blood culture (routine x 2)     Status: None (Preliminary result)   Collection Time: 02/20/20 12:20 AM   Specimen: BLOOD LEFT WRIST  Result Value Ref Range Status   Specimen Description   Final    BLOOD LEFT WRIST Performed at [facility] [place], [address] [place], [place] [address]    Special Requests   Final    BOTTLES DRAWN AEROBIC AND ANAEROBIC Blood Culture adequate volume Performed at [facility] [place], [address] [place], [place] [address]    Culture   Final    NO GROWTH 1 DAY Performed at [facility], [address] [place], [place] [address]    Report Status PENDING  Incomplete  Blood culture (routine x 2)     Status: None (Preliminary result)   Collection Time: 02/20/20 12:30 AM   Specimen: BLOOD LEFT ARM  Result Value Ref Range Status   Specimen Description   Final    BLOOD LEFT ARM Performed at [facility] [place], [address] [place], [place] [address]    Special Requests   Final    BOTTLES DRAWN AEROBIC AND ANAEROBIC Blood Culture adequate volume Performed at [facility] [place], [address] [place], [place] [address]    Culture   Final    NO GROWTH 1 DAY Performed at [facility], [address] [place], [place] [address]    Report Status PENDING  Incomplete  SARS Coronavirus 2 by RT PCR (hospital order, performed in [facility] lab) Nasopharyngeal Nasopharyngeal Swab     Status: None   Collection Time: 02/20/20  3:50 AM   Specimen: Nasopharyngeal Swab  Result Value Ref Range Status   SARS Coronavirus 2 NEGATIVE NEGATIVE Final    Comment: (NOTE) SARS-CoV-2 target nucleic acids are NOT DETECTED.  The SARS-CoV-2 RNA is generally detectable in upper and lower respiratory specimens during the acute phase of infection. The lowest concentration of SARS-CoV-2 viral copies this assay can detect is 250 copies / mL. A negative result does not preclude SARS-CoV-2 infection and should not be used as the sole basis for treatment or other patient management decisions.  A negative result may occur with improper specimen collection / handling, submission of specimen other than nasopharyngeal swab, presence of viral mutation(s) within the areas targeted by this assay, and inadequate number of viral copies (<250 copies / mL). A negative result must be combined with clinical observations, patient history, and epidemiological information.  Fact Sheet for Patients:   BoilerBrush.com.cyhttps://www.fda.gov/media/136312/download  Fact Sheet for Healthcare  Providers: https://pope.com/https://www.fda.gov/media/136313/download  This test is not yet approved or  cleared by the [place] FDA and has been authorized for detection and/or diagnosis of SARS-CoV-2 by FDA under an Emergency Use Authorization (EUA).  This EUA will remain in effect (meaning this test can be used) for the duration of the COVID-19 declaration under Section 564(b)(1) of the Act, 21 U.S.C. section 360bbb-3(b)(1), unless the authorization is  terminated or revoked sooner.  Performed at [facility], [address] [place], [place] [address]   Surgical pcr screen     Status: None   Collection Time: 02/20/20  5:43 AM   Specimen: Nasal Mucosa; Nasal Swab  Result Value Ref Range Status   MRSA, PCR NEGATIVE NEGATIVE Final   Staphylococcus aureus NEGATIVE NEGATIVE Final    Comment: (NOTE) The Xpert SA Assay (FDA approved for NASAL specimens in patients 69 years of age and older), is one component of a comprehensive surveillance program. It is not intended to diagnose infection nor to guide or monitor treatment. Performed at [facility], [address], [place], [place] [address]   Aerobic/Anaerobic Culture (surgical/deep wound)     Status: None (Preliminary result)   Collection Time: 02/20/20 11:28 AM   Specimen: Abscess  Result Value Ref Range Status   Specimen Description ABSCESS  Final   Special Requests RIGHT HAND SPEC A  Final   Gram Stain   Final    MODERATE WBC PRESENT, PREDOMINANTLY PMN NO ORGANISMS SEEN    Culture   Final    CULTURE REINCUBATED FOR BETTER GROWTH Performed at [facility], [address], [place], [place] [address]    Report Status PENDING  Incomplete  Aerobic/Anaerobic Culture (surgical/deep wound)     Status: None (Preliminary result)   Collection Time: 02/20/20 11:46 AM   Specimen: Abscess  Result Value Ref Range Status   Specimen Description ABSCESS  Final   Special Requests RIGHT ANTECUBITAL FOSSA SPEC B  Final   Gram  Stain   Final    FEW WBC PRESENT, PREDOMINANTLY PMN NO ORGANISMS SEEN    Culture   Final    NO GROWTH < 24 HOURS Performed at [facility], [address], [place], [place] [address]    Report Status PENDING  Incomplete    RADIOLOGY STUDIES/RESULTS: DG Chest 2 View  Result Date: 02/19/2020 CLINICAL DATA:  Infection, IV drug use EXAM: CHEST - 2 VIEW COMPARISON:  04/20/2017 FINDINGS: The heart size and mediastinal contours are within normal limits. Both lungs are clear. The visualized skeletal structures are unremarkable. IMPRESSION: No active cardiopulmonary disease. Electronically Signed   By: Helyn Numbers MD   On: 02/19/2020 21:56   CT Extrem Up Entire Arm R W/CM  Result Date: 02/20/2020 CLINICAL DATA:  Soft tissue infection.  Cellulitis.  Forearm DVT. EXAM: CT OF THE UPPER RIGHT EXTREMITY WITH CONTRAST TECHNIQUE: Multidetector CT imaging of the upper right extremity was performed according to the standard protocol following intravenous contrast administration. COMPARISON:  None. CONTRAST:  OMNIPAQUE IOHEXOL 300 MG/ML  SOLN FINDINGS: Bones/Joint/Cartilage There is no acute displaced fracture. No CT evidence for osteomyelitis. Ligaments Suboptimally assessed by CT. Muscles and Tendons No large intramuscular hematoma. Soft tissues There are mildly enlarged right axillary lymph nodes. There is subcutaneous edema involving the patient's forearm. There is associated skin thickening. There are no pockets of subcutaneous gas. There is extensive soft tissue swelling about the patient's dorsal right hand with findings suspicious for a developing abscess within the dorsal subcutaneous soft tissues at the level of the wrist (axial series 8, image 672 and coronal series 9, image 112). This collection measures approximately 2 by 0.6 by 2.4 cm. This collection is suboptimally evaluated secondary to extensive streak artifact. IMPRESSION: 1. No acute osseous abnormality. 2. There is subcutaneous  soft tissue edema involving the patient's forearm and hand. This is consistent with  cellulitis in the appropriate clinical setting. 3. Findings are suspicious for a small subcutaneous fluid collection involving the dorsal soft tissues at the level of the patient's wrist. This is concerning for developing abscess. This finding is suboptimally evaluated secondary to extensive streak artifact. A dedicated ultrasound would be useful for further evaluation. 4. Mildly enlarged, likely reactive lymph nodes in the right axilla. Electronically Signed   By: Katherine Mantle M.D.   On: 02/20/2020 01:02     LOS: 1 day   Jeoffrey Massed, MD  [facility]    To contact the attending provider between 7A-7P or the covering provider during after hours 7P-7A, please log into the web site [email or web address] and access using universal [place] password for that web site. If you do not have the password, please call the hospital operator.  02/21/2020, 11:42 AM

## 2020-02-21 NOTE — Progress Notes (Signed)
   Ortho Hand Progress Note  Subjective: No acute events last night.  States pain in right upper extremity improved from preoperative baseline.   Objective: Vital signs in last 24 hours: Temp:  [97 F (36.1 C)-98.6 F (37 C)] 98.2 F (36.8 C) (07/25 0914) Pulse Rate:  [74-101] 82 (07/25 0914) Resp:  [14-20] 18 (07/25 0914) BP: (101-154)/(62-99) 120/79 (07/25 0914) SpO2:  [90 %-97 %] 93 % (07/25 0914)  Intake/Output from previous day: 07/24 0701 - 07/25 0700 In: 2310.8 [P.O.:1080; I.V.:800; IV Piggyback:430.8] Out: 3105 [Urine:3100; Blood:5] Intake/Output this shift: No intake/output data recorded.  Recent Labs    02/19/20 2129 02/21/20 0447  HGB 14.0 12.8*   Recent Labs    02/19/20 2129 02/21/20 0447  WBC 15.5* 16.6*  RBC 4.66 4.39  HCT 39.1 37.4*  PLT 335 317   Recent Labs    02/19/20 2129 02/21/20 0447  NA 130* 134*  K 3.9 3.9  CL 98 98  CO2 20* 27  BUN 10 8  CREATININE 0.50* 0.67  GLUCOSE 333* 230*  CALCIUM 8.6* 9.0   No results for input(s): LABPT, INR in the last 72 hours.  Aaox3 nad Resp nonlabored RRR Right upper extremity: Dressings removed.  Incisions over the antecubital fossa as well as dorsal wrist with sutures intact.  Penrose drains in place.  Small amount of purulent drainage from the incision sites.  Significant decreased erythema to the dorsum of the hand and forearm from the preoperative baseline.  Intact digit and wrist range of motion, improved from preoperative state.  Fingertips are warm well perfused with brisk capillary refill.  His sensation is grossly intact light touch throughout all digits.   Assessment/Plan: #1 right dorsal wrist abscess 2.  Right antecubital fossa abscess  Status post irrigation debridement of the right dorsal wrist and right antecubital fossa.  Date of surgery 02/20/2020  -Continue inpatient care under the medicine service. -Daily dressing changes and reinforcement as needed. -Plan to pull Penrose  drains tomorrow. -Cultures pending.  Gram stain negative -Antibiotics per primary -Encourage digit, wrist and elbow range of motion to help prevent postoperative stiffness.  Right upper extremity elevation and ice packs as needed.  Jason Wolf 02/21/2020, 9:49 AM  [phone]

## 2020-02-22 LAB — BASIC METABOLIC PANEL
Anion gap: 10 (ref 5–15)
BUN: 9 mg/dL (ref 6–20)
CO2: 25 mmol/L (ref 22–32)
Calcium: 8.4 mg/dL — ABNORMAL LOW (ref 8.9–10.3)
Chloride: 100 mmol/L (ref 98–111)
Creatinine, Ser: 0.67 mg/dL (ref 0.61–1.24)
GFR calc Af Amer: >60 mL/min (ref >60)
GFR calc non Af Amer: >60 mL/min (ref >60)
Glucose, Bld: 248 mg/dL — ABNORMAL HIGH (ref 70–99)
Potassium: 3.3 mmol/L — ABNORMAL LOW (ref 3.5–5.1)
Sodium: 135 mmol/L (ref 135–145)

## 2020-02-22 LAB — GLUCOSE, CAPILLARY
Glucose-Capillary: 272 mg/dL — ABNORMAL HIGH (ref 70–99)
Glucose-Capillary: 247 mg/dL — ABNORMAL HIGH (ref 70–99)
Glucose-Capillary: 223 mg/dL — ABNORMAL HIGH (ref 70–99)
Glucose-Capillary: 209 mg/dL — ABNORMAL HIGH (ref 70–99)
Glucose-Capillary: 225 mg/dL — ABNORMAL HIGH (ref 70–99)

## 2020-02-22 LAB — CBC
HCT: 35.4 % — ABNORMAL LOW (ref 39.0–52.0)
Hemoglobin: 12.3 g/dL — ABNORMAL LOW (ref 13.0–17.0)
MCH: 29.8 pg (ref 26.0–34.0)
MCHC: 34.7 g/dL (ref 30.0–36.0)
MCV: 85.7 fL (ref 80.0–100.0)
Platelets: 321 10*3/uL (ref 150–400)
RBC: 4.13 MIL/uL — ABNORMAL LOW (ref 4.22–5.81)
RDW: 11.9 % (ref 11.5–15.5)
WBC: 9.9 10*3/uL (ref 4.0–10.5)
nRBC: 0 % (ref 0.0–0.2)

## 2020-02-22 MED ORDER — LIVING WELL WITH DIABETES BOOK
Freq: Once | Status: AC
  Filled 2020-02-22: qty 1

## 2020-02-22 MED ORDER — INSULIN GLARGINE 100 UNIT/ML ~~LOC~~ SOLN
26.0000 [IU] | Freq: Every day | SUBCUTANEOUS | Status: DC
  Filled 2020-02-22: qty 0.26

## 2020-02-22 MED ORDER — INSULIN STARTER KIT- PEN NEEDLES (ENGLISH)
1.0000 | Freq: Once | Status: AC
  Administered 2020-02-22: 1
  Filled 2020-02-22: qty 1

## 2020-02-22 MED ORDER — INSULIN ASPART 100 UNIT/ML ~~LOC~~ SOLN
8.0000 [IU] | Freq: Three times a day (TID) | SUBCUTANEOUS | Status: DC
  Administered 2020-02-22 – 2020-02-23 (×3): 8 [IU] via SUBCUTANEOUS

## 2020-02-22 MED ORDER — POTASSIUM CHLORIDE CRYS ER 20 MEQ PO TBCR
40.0000 meq | EXTENDED_RELEASE_TABLET | Freq: Once | ORAL | Status: AC
  Administered 2020-02-22: 40 meq via ORAL
  Filled 2020-02-22: qty 2

## 2020-02-22 MED ORDER — CEFAZOLIN SODIUM-DEXTROSE 2-4 GM/100ML-% IV SOLN
2.0000 g | Freq: Three times a day (TID) | INTRAVENOUS | Status: DC
  Administered 2020-02-22 – 2020-02-23 (×4): 2 g via INTRAVENOUS
  Filled 2020-02-22 (×6): qty 100

## 2020-02-22 NOTE — TOC Initial Note (Signed)
Transition of Care [facility]) - Initial/Assessment Note    Patient Details  Name: Jason Wolf MRN: [id number] Date of Birth: [birth year]  Transition [facility] CM/SW Contact:    Bess Kinds, RN Phone Number: [phone] 02/22/2020, 2:09 PM  Clinical Narrative:                  Notified by MD of patient needing PCP and medication assistance. Hospital follow up scheduled with [facility] for 8/12 2pm. TOC to fill discharge medications - [facility] f/u for subsequent prescription assistance help. TOC following for transition needs.   Expected Discharge Plan: Home/Self Care Barriers to Discharge: Continued Medical Work up   Patient Goals and CMS Choice        Expected Discharge Plan and Services Expected Discharge Plan: Home/Self Care                                              Prior Living Arrangements/Services                       Activities of Daily Living Home Assistive Devices/Equipment: Blood pressure cuff, CBG Meter, Eyeglasses ADL Screening (condition at time of admission) Patient's cognitive ability adequate to safely complete daily activities?: Yes Is the patient deaf or have difficulty hearing?: No Does the patient have difficulty seeing, even when wearing glasses/contacts?: No Does the patient have difficulty concentrating, remembering, or making decisions?: No Patient able to express need for assistance with ADLs?: Yes Does the patient have difficulty dressing or bathing?: No Independently performs ADLs?: Yes (appropriate for developmental age) Does the patient have difficulty walking or climbing stairs?: No Weakness of Legs: None Weakness of Arms/Hands: Right  Permission Sought/Granted                  Emotional Assessment              Admission diagnosis:  Abscess of right arm [L02.413] Cellulitis of right arm [L03.113] IV drug abuse (HCC) [F19.10] Patient Active Problem List   Diagnosis Date Noted  . Cellulitis of right arm  02/20/2020  . IVDU (intravenous drug user) 02/20/2020  . Polysubstance abuse (HCC) 02/20/2020  . Cutaneous abscess of right wrist 02/20/2020  . Bipolar 2 disorder, major depressive episode (HCC) 02/06/2016  . Diabetes (HCC) 07/07/2015  . Bipolar disorder (HCC) 07/07/2015  . Essential hypertension 07/07/2015  . Hyperlipemia 07/07/2015   PCP:  Patient, No Pcp Per Pharmacy:   [facility] - [place], [place] - [address] [place] [address] Phone: [phone] Fax: [phone]  [facility] [address] - [place], [place] - [address], [address] [place] [address] Phone: [phone] Fax: [phone]     Social Determinants of Health (SDOH) Interventions    Readmission Risk Interventions No flowsheet data found.

## 2020-02-22 NOTE — Progress Notes (Addendum)
Inpatient Diabetes Program Recommendations  AACE/ADA: New Consensus Statement on Inpatient Glycemic Control (2015)  Target Ranges:  Prepandial:   less than 140 mg/dL      Peak postprandial:   less than 180 mg/dL (1-2 hours)      Critically ill patients:  140 - 180 mg/dL   Lab Results  Component Value Date   GLUCAP 223 (H) 02/22/2020   HGBA1C 11.0 (H) 02/20/2020    Review of Glycemic Control Results for Jason Wolf, Jason Wolf (MRN [id number]) as of 02/22/2020 12:31  Ref. Range 02/21/2020 11:08 02/21/2020 16:32 02/21/2020 20:50 02/22/2020 06:39 02/22/2020 11:21  Glucose-Capillary Latest Ref Range: 70 - 99 mg/dL 277 (H) 270 (H) 248 (H) 247 (H) 223 (H)   Diabetes history:  DM2 Outpatient Diabetes medications:  None Current orders for Inpatient glycemic control:  Lantus to increase to 26 units from 18 units to start tomorrow morning Novolog 8 units meal coverage tid Novolog 0-15 units tid with meals  Note:  Spoke with pt and family at bedside.  Reviewed patient's current A1c of 11%. Explained what a A1c is and what it measures. Also reviewed goal A1c with patient, importance of good glucose control @ home, and blood sugar goals.  Educated patient on insulin pen use at home. Reviewed contents of insulin flexpen starter kit. Reviewed all steps of insulin pen including attachment of needle, 2-unit air shot, dialing up dose, giving injection, removing needle, disposal of sharps, storage of unused insulin, disposal of insulin etc. Patient able to provide successful return demonstration. Also reviewed troubleshooting with insulin pen. MD to give patient Rxs for insulin pens and insulin pen needles.  He is willing to start on insulin at home.  His mother is prescribed [place] daily.   He does not like Metformin because of side effects.  He stopped taking over 1 year ago.  He was current with the [facility] in [place] at one time but then started working and they would not see him anymore.  He is currently  unemployed without insurance.  Will ask TOC to assist with making an appointment for follow up.   Encouraged him to check his blood sugar AC & HS.  If blood sugars are less than 100 mg/dl at HS he should consume 15 gram CHO and recheck in 145 minutes.  Call MD if blood sugars are consistently >200 or <100 mg/dl.  Educated pt on The Plate Method, CHO's and foods that contain CHO's.  He drinks regular sodas and states he will switch to diet drinks.     Would recommend discharging home on insulins, basal, SSI and meal coverage given insulin needs while inpatient.     Lantus Solostar Insulin Pen- [id number] Novolog Flexpen- [id number] Insulin pen needles- [phone]  Please allow pt to administer insulin while inpatient with guidance of RN.    Will follow up tomorrow morning.  Will continue to follow while inpatient.  Thank you, Reche Dixon, RN, BSN Diabetes Coordinator Inpatient Diabetes Program [phone] (team pager from 8a-5p)

## 2020-02-22 NOTE — Progress Notes (Signed)
PROGRESS NOTE        PATIENT DETAILS Name: Jason Wolf Age: 39 y.o. Sex: male Date of Birth: 07/06/1981 Admit Date: 02/19/2020 Admitting Physician Carlton AdamBradley S Chotiner, MD ZOX:[id number]:Patient, No Pcp Per  Brief Narrative: Patient is a 39 y.o. 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  Significant events: 7/23>> admit to [facility]-right hand/wrist infection  Significant studies: 7/24>> CT of right upper extremity: Subcutaneous tissue edema involving patient's forearm and right hand-consistent with cellulitis, small subcutaneous fluid collection involving the dorsal soft tissue at the level of patient's wrist-concerning for developing abscess.  Antimicrobial therapy: Vancomycin: 7/23>>  Microbiology data: 7/24>> blood culture: No growth 7/24>> abscess right hand/antecubital fossa culture: Group B strep  Procedures : 7/24>> irrigation/debridement of right dorsal wrist abscess, right antecubital fossa abscess, extensor Tina synovectomy of the second, third, fourth and fifth dorsal compartments.  Consults: Orthopedics  DVT Prophylaxis : SCDs Start: 02/20/20 0456  Subjective: Complains of pain at the operative site-otherwise no major events overnight.  Assessment/Plan: Right hand/forearm soft tissue swelling with abscess formation: Overall improved-still with pain at the operative site-intraoperative cultures positive for group B strep-switch to Ancef-minimize IV narcotics-attempt pain control with oral narcotics-reassess on 7/27-if pain controlled-can consider switching to oral antibiotics on discharge.  Appreciate hand surgery/orthopedics follow-up.  DM-2 (A1c 11.0 on 02/20/2020) with uncontrolled hyperglycemia: Appears noncompliant with medications-increase Lantus to 26  units, increase Premeal NovoLog to 8 units-continue SSI-follow in optimize accordingly.  Will consult diabetic coordinator for education-we will ask nursing staff to begin insulin administration education.  We will add Metformin on discharge.  Have asked case management to see if we can get this patient a primary care follow-up on discharge.  Recent Labs    02/21/20 2050 02/22/20 0639 02/22/20 1121  GLUCAP 248* 247* 223*    HTN: BP stable-not on any antihypertensives.  Monitor for now.  Bipolar disorder: Stable-continue Depakote and Zoloft  Polysubstance abuse: Mostly IV methamphetamine use-relapsed after 22 months-consult-we will need social work evaluation prior to discharge.  Morbid Obesity: Estimated body mass index is 35.26 kg/m as calculated from the following:   Height as of this encounter: 6' (1.829 m).   Weight as of this encounter: 117.9 kg.    Diet: Diet Order            Diet Carb Modified Fluid consistency: Thin; Room service appropriate? Yes  Diet effective now                  Code Status: Full code  Family Communication: None at bedside  Disposition Plan: Status is: Inpatient  Remains inpatient appropriate because:Inpatient level of care appropriate due to severity of illness   Dispo: The patient is from: Home              Anticipated d/c is to: Home              Anticipated d/c date is: 2 days              Patient currently is not medically stable to d/c.   Barriers to Discharge: Right hand/forearm soft tissue infection with abscess formation-on IV antibiotics-requiring I&D.  Antimicrobial agents: Anti-infectives (From admission, onward)   Start  Dose/Rate Route Frequency Ordered Stop   02/22/20 0930  ceFAZolin (ANCEF) IVPB 2g/100 mL premix     Discontinue     2 g 200 mL/hr over 30 Minutes Intravenous Every 8 hours 02/22/20 0928     02/20/20 1100  vancomycin (VANCOCIN) IVPB 1000 mg/200 mL premix  Status:  Discontinued        1,000 mg 200  mL/hr over 60 Minutes Intravenous Every 8 hours 02/20/20 1037 02/22/20 0927   02/20/20 0056  vancomycin (VANCOCIN) 1-5 GM/200ML-% IVPB       Note to Pharmacy: Dortha Schwalbe   : cabinet override      02/20/20 0056 02/20/20 0310   02/20/20 0030  vancomycin (VANCOREADY) IVPB 2000 mg/400 mL        2,000 mg 200 mL/hr over 120 Minutes Intravenous  Once 02/20/20 0019 02/20/20 0719       Time spent: 25- minutes-Greater than 50% of this time was spent in counseling, explanation of diagnosis, planning of further management, and coordination of care.  MEDICATIONS: Scheduled Meds: . insulin aspart  0-15 Units Subcutaneous TID WC  . insulin aspart  4 Units Subcutaneous TID WC  . insulin glargine  18 Units Subcutaneous Daily  . sodium chloride flush  3 mL Intravenous Once   Continuous Infusions: .  ceFAZolin (ANCEF) IV 200 mL/hr at 02/22/20 1100   PRN Meds:.acetaminophen **OR** acetaminophen, HYDROcodone-acetaminophen, HYDROmorphone (DILAUDID) injection, ondansetron **OR** ondansetron (ZOFRAN) IV   PHYSICAL EXAM: Vital signs: Vitals:   02/21/20 1703 02/21/20 2051 02/22/20 0532 02/22/20 0858  BP: (!) 133/83 (!) 137/74 (!) 135/85 (!) 132/72  Pulse: 83 88 85 87  Resp: 18 18 18 20   Temp: 97.9 F (36.6 C) 98.7 F (37.1 C) 98.1 F (36.7 C) 98.8 F (37.1 C)  TempSrc: Oral Oral  Oral  SpO2: 93% 90% 93% (!) 89%  Weight:      Height:       Filed Weights   02/19/20 2114  Weight: (!) 117.9 kg   Body mass index is 35.26 kg/m.   Gen Exam:Alert awake-not in any distress HEENT:atraumatic, normocephalic Chest: B/L clear to auscultation anteriorly CVS:S1S2 regular Abdomen:soft non tender, non distended Extremities:no edema Neurology: Non focal Skin: no rash  I have personally reviewed following labs and imaging studies  LABORATORY DATA: CBC: Recent Labs  Lab 02/19/20 2129 02/21/20 0447 02/22/20 0213  WBC 15.5* 16.6* 9.9  NEUTROABS 11.9*  --   --   HGB 14.0 12.8* 12.3*  HCT  39.1 37.4* 35.4*  MCV 83.9 85.2 85.7  PLT 335 317 321    Basic Metabolic Panel: Recent Labs  Lab 02/19/20 2129 02/21/20 0447 02/22/20 0213  NA 130* 134* 135  K 3.9 3.9 3.3*  CL 98 98 100  CO2 20* 27 25  GLUCOSE 333* 230* 248*  BUN 10 8 9   CREATININE 0.50* 0.67 0.67  CALCIUM 8.6* 9.0 8.4*    GFR: Estimated Creatinine Clearance: 164.3 mL/min (by C-G formula based on SCr of 0.67 mg/dL).  Liver Function Tests: Recent Labs  Lab 02/19/20 2129  AST 18  ALT 25  ALKPHOS 77  BILITOT 0.7  PROT 6.7  ALBUMIN 3.7   No results for input(s): LIPASE, AMYLASE in the last 168 hours. No results for input(s): AMMONIA in the last 168 hours.  Coagulation Profile: No results for input(s): INR, PROTIME in the last 168 hours.  Cardiac Enzymes: No results for input(s): CKTOTAL, CKMB, CKMBINDEX, TROPONINI in the last 168 hours.  BNP (last 3 results)  No results for input(s): PROBNP in the last 8760 hours.  Lipid Profile: No results for input(s): CHOL, HDL, LDLCALC, TRIG, CHOLHDL, LDLDIRECT in the last 72 hours.  Thyroid Function Tests: No results for input(s): TSH, T4TOTAL, FREET4, T3FREE, THYROIDAB in the last 72 hours.  Anemia Panel: No results for input(s): VITAMINB12, FOLATE, FERRITIN, TIBC, IRON, RETICCTPCT in the last 72 hours.  Urine analysis:    Component Value Date/Time   COLORURINE YELLOW 02/20/2020 0640   APPEARANCEUR CLEAR 02/20/2020 0640   APPEARANCEUR Clear 11/10/2014 1132   LABSPEC >1.046 (H) 02/20/2020 0640   LABSPEC 1.011 11/10/2014 1132   PHURINE 6.0 02/20/2020 0640   GLUCOSEU >=500 (A) 02/20/2020 0640   GLUCOSEU Negative 11/10/2014 1132   HGBUR NEGATIVE 02/20/2020 0640   BILIRUBINUR NEGATIVE 02/20/2020 0640   BILIRUBINUR Negative 11/10/2014 1132   KETONESUR 20 (A) 02/20/2020 0640   PROTEINUR NEGATIVE 02/20/2020 0640   NITRITE NEGATIVE 02/20/2020 0640   LEUKOCYTESUR NEGATIVE 02/20/2020 0640   LEUKOCYTESUR Negative 11/10/2014 1132    Sepsis  Labs: Lactic Acid, Venous    Component Value Date/Time   LATICACIDVEN 1.1 02/20/2020 0020    MICROBIOLOGY: Recent Results (from the past 240 hour(s))  Blood culture (routine x 2)     Status: None (Preliminary result)   Collection Time: 02/20/20 12:20 AM   Specimen: BLOOD LEFT WRIST  Result Value Ref Range Status   Specimen Description   Final    BLOOD LEFT WRIST Performed at [facility], [address] [place], [place] [address]    Special Requests   Final    BOTTLES DRAWN AEROBIC AND ANAEROBIC Blood Culture adequate volume Performed at [facility], [address] [place], [place] [address]    Culture   Final    NO GROWTH 2 DAYS Performed at [facility], [address], [place], [place] [address]    Report Status PENDING  Incomplete  Blood culture (routine x 2)     Status: None (Preliminary result)   Collection Time: 02/20/20 12:30 AM   Specimen: BLOOD LEFT ARM  Result Value Ref Range Status   Specimen Description   Final    BLOOD LEFT ARM Performed at [facility], [address] [place], [place] [address]    Special Requests   Final    BOTTLES DRAWN AEROBIC AND ANAEROBIC Blood Culture adequate volume Performed at [facility], [address] [place], [place] [address]    Culture   Final    NO GROWTH 2 DAYS Performed at [facility], [address] [place], [place] [address]    Report Status PENDING  Incomplete  SARS Coronavirus 2 by RT PCR (hospital order, performed in [facility] hospital lab) Nasopharyngeal Nasopharyngeal Swab     Status: None   Collection Time: 02/20/20  3:50 AM   Specimen: Nasopharyngeal Swab  Result Value Ref Range Status   SARS Coronavirus 2 NEGATIVE NEGATIVE Final    Comment: (NOTE) SARS-CoV-2 target nucleic acids are NOT DETECTED.  The SARS-CoV-2 RNA is generally detectable in upper and lower respiratory specimens during the acute phase of infection. The lowest concentration  of SARS-CoV-2 viral copies this assay can detect is 250 copies / mL. A negative result does not preclude SARS-CoV-2 infection and should not be used as the sole basis for treatment or other patient management decisions.  A negative result may occur with improper specimen collection / handling, submission of specimen  other than nasopharyngeal swab, presence of viral mutation(s) within the areas targeted by this assay, and inadequate number of viral copies (<250 copies / mL). A negative result must be combined with clinical observations, patient history, and epidemiological information.  Fact Sheet for Patients:   BoilerBrush.com.cy  Fact Sheet for Healthcare Providers: https://pope.com/  This test is not yet approved or  cleared by the [place] FDA and has been authorized for detection and/or diagnosis of SARS-CoV-2 by FDA under an Emergency Use Authorization (EUA).  This EUA will remain in effect (meaning this test can be used) for the duration of the COVID-19 declaration under Section 564(b)(1) of the Act, 21 U.S.C. section 360bbb-3(b)(1), unless the authorization is terminated or revoked sooner.  Performed at [facility], [address] [place], [place] [address]   Surgical pcr screen     Status: None   Collection Time: 02/20/20  5:43 AM   Specimen: Nasal Mucosa; Nasal Swab  Result Value Ref Range Status   MRSA, PCR NEGATIVE NEGATIVE Final   Staphylococcus aureus NEGATIVE NEGATIVE Final    Comment: (NOTE) The Xpert SA Assay (FDA approved for NASAL specimens in patients 35 years of age and older), is one component of a comprehensive surveillance program. It is not intended to diagnose infection nor to guide or monitor treatment. Performed at [facility], [address], [place], [place] [address]   Aerobic/Anaerobic Culture (surgical/deep wound)     Status: None (Preliminary result)   Collection Time:  02/20/20 11:28 AM   Specimen: Abscess  Result Value Ref Range Status   Specimen Description ABSCESS  Final   Special Requests RIGHT HAND SPEC A  Final   Gram Stain   Final    MODERATE WBC PRESENT, PREDOMINANTLY PMN NO ORGANISMS SEEN Performed at [facility], [address] [place], [place] [address]    Culture   Final    RARE GROUP B STREP(S.AGALACTIAE)ISOLATED TESTING AGAINST S. AGALACTIAE NOT ROUTINELY PERFORMED DUE TO PREDICTABILITY OF AMP/PEN/VAN SUSCEPTIBILITY. NO ANAEROBES ISOLATED; CULTURE IN PROGRESS FOR 5 DAYS    Report Status PENDING  Incomplete  Aerobic/Anaerobic Culture (surgical/deep wound)     Status: None (Preliminary result)   Collection Time: 02/20/20 11:46 AM   Specimen: Abscess  Result Value Ref Range Status   Specimen Description ABSCESS  Final   Special Requests RIGHT ANTECUBITAL FOSSA SPEC B  Final   Gram Stain   Final    FEW WBC PRESENT, PREDOMINANTLY PMN NO ORGANISMS SEEN    Culture   Final    CULTURE REINCUBATED FOR BETTER GROWTH Performed at [facility], [address], [place], [place] [address]    Report Status PENDING  Incomplete    RADIOLOGY STUDIES/RESULTS: No results found.   LOS: 2 days   Jeoffrey Massed, MD  [facility]    To contact the attending provider between 7A-7P or the covering provider during after hours 7P-7A, please log into the web site [email or web address] and access using universal [place] password for that web site. If you do not have the password, please call the hospital operator.  02/22/2020, 11:52 AM

## 2020-02-22 NOTE — Plan of Care (Signed)
°  Problem: Health Behavior/Discharge Planning: Goal: Ability to manage health-related needs will improve Outcome: Progressing   Problem: Clinical Measurements: Goal: Ability to avoid or minimize complications of infection will improve Outcome: Progressing

## 2020-02-22 NOTE — Progress Notes (Signed)
   Ortho Hand Progress Note  Subjective: No acute events last night.  Pain controlled.  Objective: Vital signs in last 24 hours: Temp:  [97.9 F (36.6 C)-98.8 F (37.1 C)] 98.8 F (37.1 C) (07/26 0858) Pulse Rate:  [83-88] 87 (07/26 0858) Resp:  [18-20] 20 (07/26 0858) BP: (132-137)/(72-85) 132/72 (07/26 0858) SpO2:  [89 %-93 %] 89 % (07/26 0858)  Intake/Output from previous day: 07/25 0701 - 07/26 0700 In: 1918.4 [P.O.:1680; IV Piggyback:238.4] Out: 4775 [Urine:4775] Intake/Output this shift: Total I/O In: 120 [P.O.:120] Out: 475 [Urine:475]  Recent Labs    02/19/20 2129 02/21/20 0447 02/22/20 0213  HGB 14.0 12.8* 12.3*   Recent Labs    02/21/20 0447 02/22/20 0213  WBC 16.6* 9.9  RBC 4.39 4.13*  HCT 37.4* 35.4*  PLT 317 321   Recent Labs    02/21/20 0447 02/22/20 0213  NA 134* 135  K 3.9 3.3*  CL 98 100  CO2 27 25  BUN 8 9  CREATININE 0.67 0.67  GLUCOSE 230* 248*  CALCIUM 9.0 8.4*   No results for input(s): LABPT, INR in the last 72 hours.  Aaox3 nad Resp nonlabored RRR Right upper extremity: Dressings removed.  Incisions over the antecubital fossa as well as dorsal wrist with sutures intact.  Penrose drains in place.  Small amount of purulent drainage from the incision sites.  Significant decreased erythema to the dorsum of the hand and forearm from the preoperative baseline.  Intact digit and wrist range of motion, improved from preoperative state.  Fingertips are warm well perfused with brisk capillary refill.  His sensation is grossly intact light touch throughout all digits.   Assessment/Plan: #1 right dorsal wrist abscess 2.  Right antecubital fossa abscess  Status post irrigation debridement of the right dorsal wrist and right antecubital fossa.  Date of surgery 02/20/2020  -Continue inpatient care under the medicine service. -Daily dressing changes and reinforcement as needed. -Drains removed -Cultures group B strep.  Gram stain  negative -Antibiotics per primary -Encourage digit, wrist and elbow range of motion to help prevent postoperative stiffness.  Right upper extremity elevation and ice packs as needed.  OK for d/c from hand standpoint with close follow up with me once antibiotic plan established.  Jason Wolf 02/22/2020, 10:13 AM  [phone]

## 2020-02-22 NOTE — TOC Initial Note (Signed)
Transition of Care [facility]) - Initial/Assessment Note    Patient Details  Name: Jason Wolf MRN: [id number] Date of Birth: 1980-10-06  [facility]) CM/SW Contact:    Bess Kinds, RN Phone Number: [phone] 02/22/2020, 3:17 PM  Clinical Narrative:                  Spoke with patient at the bedside to discuss transition plans. Patient to return home with his parents. He states that his mom is diabetic and can support his learning to self administer insulin.   Discussed hospital follow up appointment at [facility] for 8/12 at 2pm - patient agreeable.   Advised of medication assistance and discharge medications to be delivered to his room prior to discharge. Patient agreeable and states that he can afford the anticipated copays.   Discussed [facility] pharmacy to follow up with him to set up for patient assistance programs for his insulin. Patient verbalized understanding.   Patient states that he will have transportation home.  Match placed and [facility] pharmacy aware.   TOC following for transition needs.   Expected Discharge Plan: Home/Self Care Barriers to Discharge: Continued Medical Work up   Patient Goals and CMS Choice Patient states their goals for this hospitalization and ongoing recovery are:: return home CMS Medicare.gov Compare Post Acute Care list provided to:: Patient Choice offered to / list presented to : NA  Expected Discharge Plan and Services Expected Discharge Plan: Home/Self Care In-house Referral: NA Discharge Planning Services: CM Consult Post Acute Care Choice: NA Living arrangements for the past 2 months: Single Family Home                 DME Arranged: N/A DME Agency: NA       HH Arranged: NA HH Agency: NA        Prior Living Arrangements/Services Living arrangements for the past 2 months: Single Family Home Lives with:: Self, Parents Patient language and need for interpreter reviewed:: Yes        Need for Family Participation in Patient  Care: Yes (Comment) Care giver support system in place?: Yes (comment)   Criminal Activity/Legal Involvement Pertinent to Current Situation/Hospitalization: No - Comment as needed  Activities of Daily Living Home Assistive Devices/Equipment: Blood pressure cuff, CBG Meter, Eyeglasses ADL Screening (condition at time of admission) Patient's cognitive ability adequate to safely complete daily activities?: Yes Is the patient deaf or have difficulty hearing?: No Does the patient have difficulty seeing, even when wearing glasses/contacts?: No Does the patient have difficulty concentrating, remembering, or making decisions?: No Patient able to express need for assistance with ADLs?: Yes Does the patient have difficulty dressing or bathing?: No Independently performs ADLs?: Yes (appropriate for developmental age) Does the patient have difficulty walking or climbing stairs?: No Weakness of Legs: None Weakness of Arms/Hands: Right  Permission Sought/Granted   Permission granted to share information with : No              Emotional Assessment Appearance:: Appears stated age Attitude/Demeanor/Rapport: Engaged Affect (typically observed): Accepting Orientation: : Oriented to Self, Oriented to  Time, Oriented to Place, Oriented to Situation Alcohol / Substance Use: Not Applicable Psych Involvement: No (comment)  Admission diagnosis:  Abscess of right arm [L02.413] Cellulitis of right arm [L03.113] IV drug abuse (HCC) [F19.10] Patient Active Problem List   Diagnosis Date Noted  . Cellulitis of right arm 02/20/2020  . IVDU (intravenous drug user) 02/20/2020  . Polysubstance abuse (HCC) 02/20/2020  . Cutaneous  abscess of right wrist 02/20/2020  . Bipolar 2 disorder, major depressive episode (HCC) 02/06/2016  . Diabetes (HCC) 07/07/2015  . Bipolar disorder (HCC) 07/07/2015  . Essential hypertension 07/07/2015  . Hyperlipemia 07/07/2015   PCP:  Patient, No Pcp Per Pharmacy:    [facility] - [place], [place] - [address] [place] [address] Phone: [phone] Fax: [phone]  [facility] [address] - [place], [place] - [address] [place] [address] Phone: [phone] Fax: [phone]  [facility] - [place], [place] - [address] [place] [address] Phone: [phone] Fax: [phone]     Social Determinants of Health (SDOH) Interventions    Readmission Risk Interventions No flowsheet data found.

## 2020-02-23 LAB — BASIC METABOLIC PANEL
Anion gap: 10 (ref 5–15)
BUN: 6 mg/dL (ref 6–20)
CO2: 22 mmol/L (ref 22–32)
Calcium: 8.6 mg/dL — ABNORMAL LOW (ref 8.9–10.3)
Chloride: 104 mmol/L (ref 98–111)
Creatinine, Ser: 0.66 mg/dL (ref 0.61–1.24)
GFR calc Af Amer: >60 mL/min (ref >60)
GFR calc non Af Amer: >60 mL/min (ref >60)
Glucose, Bld: 215 mg/dL — ABNORMAL HIGH (ref 70–99)
Potassium: 3.4 mmol/L — ABNORMAL LOW (ref 3.5–5.1)
Sodium: 136 mmol/L (ref 135–145)

## 2020-02-23 LAB — GLUCOSE, CAPILLARY
Glucose-Capillary: 212 mg/dL — ABNORMAL HIGH (ref 70–99)
Glucose-Capillary: 167 mg/dL — ABNORMAL HIGH (ref 70–99)

## 2020-02-23 MED ORDER — INSULIN GLARGINE 100 UNIT/ML SOLOSTAR PEN
26.0000 [IU] | PEN_INJECTOR | Freq: Every day | SUBCUTANEOUS | 0 refills | Status: DC
Start: 2020-02-23 — End: 2020-03-31

## 2020-02-23 MED ORDER — INSULIN PEN NEEDLE 32G X 8 MM MISC
0 refills | Status: DC
Start: 2020-02-23 — End: 2020-03-31

## 2020-02-23 MED ORDER — NOVOLOG FLEXPEN 100 UNIT/ML ~~LOC~~ SOPN
PEN_INJECTOR | SUBCUTANEOUS | 0 refills | Status: DC
Start: 2020-02-23 — End: 2020-02-23

## 2020-02-23 MED ORDER — HYDROCODONE-ACETAMINOPHEN 5-325 MG PO TABS: 1.0000 | ORAL_TABLET | Freq: Four times a day (QID) | ORAL | 0 refills | Status: DC | PRN

## 2020-02-23 MED ORDER — FREESTYLE SYSTEM KIT
1.0000 | PACK | Freq: Three times a day (TID) | 1 refills | Status: DC
Start: 2020-02-23 — End: 2020-02-23

## 2020-02-23 MED ORDER — INSULIN GLARGINE 100 UNIT/ML ~~LOC~~ SOLN
26.0000 [IU] | Freq: Every day | SUBCUTANEOUS | Status: DC
  Administered 2020-02-23: 26 [IU] via SUBCUTANEOUS
  Filled 2020-02-23: qty 0.26

## 2020-02-23 MED ORDER — INSULIN GLARGINE 100 UNIT/ML ~~LOC~~ SOLN: 30.0000 [IU] | Freq: Every day | SUBCUTANEOUS | Status: DC

## 2020-02-23 MED ORDER — POTASSIUM CHLORIDE CRYS ER 20 MEQ PO TBCR
40.0000 meq | EXTENDED_RELEASE_TABLET | Freq: Once | ORAL | Status: AC
  Administered 2020-02-23: 40 meq via ORAL
  Filled 2020-02-23: qty 2

## 2020-02-23 MED ORDER — CEPHALEXIN 500 MG PO CAPS: 500.0000 mg | ORAL_CAPSULE | Freq: Four times a day (QID) | ORAL | 0 refills | Status: AC

## 2020-02-23 MED ORDER — METFORMIN HCL 500 MG PO TABS: 500.0000 mg | ORAL_TABLET | Freq: Two times a day (BID) | ORAL | 11 refills | Status: DC

## 2020-02-23 MED ORDER — NOVOLOG FLEXPEN 100 UNIT/ML ~~LOC~~ SOPN
PEN_INJECTOR | SUBCUTANEOUS | 0 refills | Status: DC
Start: 2020-02-23 — End: 2020-03-31

## 2020-02-23 MED ORDER — FREESTYLE SYSTEM KIT
1.0000 | PACK | Freq: Three times a day (TID) | 1 refills | Status: AC
Start: 2020-02-23 — End: ?

## 2020-02-23 MED FILL — CEPHALEXIN 500 MG CAPS: 500 | 10 days supply | Qty: 40 | Fill #0

## 2020-02-23 MED FILL — NOVOLOG FLEXPEN SYRINGE: 100 | 24 days supply | Qty: 12 | Fill #0

## 2020-02-23 MED FILL — FREESTYLE LANCETS: 25 days supply | Qty: 100 | Fill #0

## 2020-02-23 MED FILL — BD PEN NDL NANO 32GX5/32: 32G X 4 MM | 30 days supply | Qty: 100 | Fill #0

## 2020-02-23 MED FILL — FREESTYLE LITE TEST STRIP: 25 days supply | Qty: 100 | Fill #0

## 2020-02-23 MED FILL — metFORMIN HCL 500 MG TABS: 500 | 30 days supply | Qty: 60 | Fill #0

## 2020-02-23 MED FILL — HYDROCODON-APAP 5-325: 5-325 | 3 days supply | Qty: 20 | Fill #0

## 2020-02-23 MED FILL — FREESTYLE LITE METER: 1 days supply | Qty: 1 | Fill #0

## 2020-02-23 MED FILL — LANTUS SOLOSTAR 100 UNITS/M: 100 | 30 days supply | Qty: 9 | Fill #0

## 2020-02-23 NOTE — TOC Transition Note (Signed)
Transition of Care [facility]) - CM/SW Discharge Note   Patient Details  Name: Tri Chittick MRN: [id number] Date of Birth: 1981/04/21  Transition of Care [facility]) CM/SW Contact:  Bartholomew Crews, RN Phone Number: [phone] 02/23/2020, 1:05 PM   Clinical Narrative:     Spoke with patient and his dad at bedside. Pending TOC delivery of discharge medications to include meter. Patient cost $26 - patient agreeable. Reviewed patient follow up with [facility] including [facility] pharmacy for assistance with patient assistance programs. Dad to provide transportation home. No further TOC needs met.   Final next level of care: Home/Self Care Barriers to Discharge: No Barriers Identified   Patient Goals and CMS Choice Patient states their goals for this hospitalization and ongoing recovery are:: return home CMS Medicare.gov Compare Post Acute Care list provided to:: Patient Choice offered to / list presented to : NA  Discharge Placement                       Discharge Plan and Services In-house Referral: NA Discharge Planning Services: CM Consult Post Acute Care Choice: NA          DME Arranged: N/A DME Agency: NA       HH Arranged: NA HH Agency: NA        Social Determinants of Health (SDOH) Interventions     Readmission Risk Interventions No flowsheet data found.

## 2020-02-23 NOTE — Progress Notes (Signed)
Discharge Note:  Patient discharged home with father.  LUA PIV removed.  Discharge Instructions reviewed with patient.  All questions answered.  TOC prescriptions sent with patient.  Clothes and 4 earrings were only belongings.  Bernie Covey RN

## 2020-02-23 NOTE — Discharge Summary (Addendum)
PATIENT DETAILS Name: Jason Wolf Age: 39 y.o. Sex: male Date of Birth: 1980/09/25 MRN: [id number]. Admitting Physician: Eben Burow, MD [id number], No Pcp Per  Admit Date: 02/19/2020 Discharge date: 02/23/2020  Recommendations for Outpatient Follow-up:  1. Follow up with PCP in 1-2 weeks 2. Please obtain CMP/CBC in one week  Admitted From:  Home  Disposition: [place]: No  Equipment/Devices: None  Discharge Condition: Stable  CODE STATUS: FULL CODE  Diet recommendation:  Diet Order            Diet - low sodium heart healthy           Diet Carb Modified Fluid consistency: Thin; Room service appropriate? Yes  Diet effective now                  Brief Narrative: Patient is a 39 y.o. 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  Significant events: 7/23>> admit to [facility]-right hand/wrist infection  Significant studies: 7/24>> CT of right upper extremity: Subcutaneous tissue edema involving patient's forearm and right hand-consistent with cellulitis, small subcutaneous fluid collection involving the dorsal soft tissue at the level of patient's wrist-concerning for developing abscess.  Antimicrobial therapy: Vancomycin: 7/23>>  Microbiology data: 7/24>> blood culture: No growth 7/24>> abscess right hand/antecubital fossa culture: Group B strep  Procedures : 7/24>> irrigation/debridement of right dorsal wrist abscess, right antecubital fossa abscess, extensor Tina synovectomy of the second, third, fourth and fifth dorsal compartments.  Consults: [place] [facility] Course: Right hand/forearm soft tissue swelling with abscess formation: Overall improved-still with pain at the operative site-intraoperative cultures positive for  group B strep-initially on IV vancomycin but will switch to Ancef-we will transition to Keflex on discharge.  Per hand surgery-okay to discharge.  Continues to have pain-but relatively well controlled-have provided him a few days supply of as needed narcotics.  He is aware that he is not to take other sedative medications while he is on narcotics.  DM-2 (A1c 11.0 on 02/20/2020) with uncontrolled hyperglycemia: Appears noncompliant with medications-stopped taking Metformin several years back-will be discharged on Lantus and sliding scale insulin.  Further optimization will be done by primary care in the outpatient setting.  He was evaluated by diabetic coordinator-RN taught him how to inject insulin.  Diabetic education-including hypoglycemic measures were provided-patient seems comfortable-managing insulin administration at home.  He is agreeable to resume Metformin on discharge as well.  He will follow with PCP for further optimization of his diabetic regimen.  HTN: BP stable-not on any antihypertensives.  Follow with PCP.  Bipolar disorder: Stable-continue Depakote and Zoloft  Polysubstance abuse: Mostly IV methamphetamine use-relapsed after 22 months-has been extensively counseled.  Morbid Obesity: Estimated body mass index is 35.26 kg/m as calculated from the following:   Height as of this encounter: 6' (1.829 m).   Weight as of this encounter: 117.9 kg.   Discharge Diagnoses:  Principal Problem:   Cellulitis of right arm Active Problems:   Diabetes ([place])   Essential hypertension   Bipolar 2 disorder, major depressive episode ([place])   IVDU (intravenous drug user)   Polysubstance abuse ([place])   Cutaneous abscess of right wrist   Discharge Instructions:  Activity:  As tolerated  Discharge Instructions    Call MD for:  redness, tenderness, or signs of infection (pain, swelling, redness, odor  or green/yellow discharge around incision site)   Complete by: As directed    Call MD  for:  severe uncontrolled pain   Complete by: As directed    Call MD for:  temperature >100.4   Complete by: As directed    Diet - low sodium heart healthy   Complete by: As directed    Discharge instructions   Complete by: As directed    Follow with Primary MD in 1-2 weeks  Follow with hand surgery-as instructed   Check CBGs before meals-keep a record of these readings and take it with you for your next appointment with your primary care MD.  Please get a complete blood count and chemistry panel checked by your Primary MD at your next visit, and again as instructed by your Primary MD.  Get Medicines reviewed and adjusted: Please take all your medications with you for your next visit with your Primary MD  Laboratory/radiological data: Please request your Primary MD to go over all hospital tests and procedure/radiological results at the follow up, please ask your Primary MD to get all Hospital records sent to his/her office.  In some cases, they will be blood work, cultures and biopsy results pending at the time of your discharge. Please request that your primary care M.D. follows up on these results.  Also Note the following: If you experience worsening of your admission symptoms, develop shortness of breath, life threatening emergency, suicidal or homicidal thoughts you must seek medical attention immediately by calling 911 or calling your MD immediately  if symptoms less severe.  You must read complete instructions/literature along with all the possible adverse reactions/side effects for all the Medicines you take and that have been prescribed to you. Take any new Medicines after you have completely understood and accpet all the possible adverse reactions/side effects.   Do not drive when taking Pain medications or sleeping medications (Benzodaizepines)  Do not take more than prescribed Pain, Sleep and Anxiety Medications. It is not advisable to combine anxiety,sleep and pain  medications without talking with your primary care practitioner  Special Instructions: If you have smoked or chewed Tobacco  in the last 2 yrs please stop smoking, stop any regular Alcohol  and or any Recreational drug use.  Wear Seat belts while driving.  Please note: You were cared for by a hospitalist during your hospital stay. Once you are discharged, your primary care physician will handle any further medical issues. Please note that NO REFILLS for any discharge medications will be authorized once you are discharged, as it is imperative that you return to your primary care physician (or establish a relationship with a primary care physician if you do not have one) for your post hospital discharge needs so that they can reassess your need for medications and monitor your lab values.   Increase activity slowly   Complete by: As directed    Leave dressing on - Keep it clean, dry, and intact until clinic visit   Complete by: As directed      Allergies as of 02/23/2020      Reactions   Codeine Nausea And Vomiting      Medication List    STOP taking these medications   divalproex 500 MG DR tablet Commonly known as: DEPAKOTE   fenofibrate 145 MG tablet Commonly known as: TRICOR   hydrochlorothiazide 12.5 MG capsule Commonly known as: MICROZIDE   naproxen 250 MG tablet Commonly known as: NAPROSYN   naproxen 500 MG tablet Commonly known  as: NAPROSYN     TAKE these medications   cephALEXin 500 MG capsule Commonly known as: KEFLEX Take 1 capsule (500 mg total) by mouth 4 (four) times daily for 10 days. End 8/2 (10 days?)   glucose monitoring kit monitoring kit 1 each by Does not apply route 4 (four) times daily - after meals and at bedtime. 1 month Diabetic Testing Supplies for QAC-QHS accuchecks.   HYDROcodone-acetaminophen 5-325 MG tablet Commonly known as: NORCO/VICODIN Take 1-2 tablets by mouth every 6 (six) hours as needed for moderate pain.   hydrOXYzine 25 MG  capsule Commonly known as: VISTARIL Take 25 mg by mouth every 6 (six) hours as needed for anxiety.   insulin glargine 100 UNIT/ML Solostar Pen Commonly known as: LANTUS Inject 26 Units into the skin daily at 10 pm.   Insulin Pen Needle 32G X 8 MM Misc Use as directed   metFORMIN 500 MG tablet Commonly known as: Glucophage Take 1 tablet (500 mg total) by mouth 2 (two) times daily with a meal.   NovoLOG FlexPen 100 UNIT/ML FlexPen Generic drug: insulin aspart 0-15 Units, Subcutaneous, 3 times daily with meals CBG < 70: Implement Hypoglycemia measures CBG 70 - 120: 0 units CBG 121 - 150: 2 units CBG 151 - 200: 3 units CBG 201 - 250: 5 units CBG 251 - 300: 8 units CBG 301 - 350: 11 units CBG 351 - 400: 15 units CBG > 400: call MD   sertraline 50 MG tablet Commonly known as: ZOLOFT Take 50 mg by mouth daily.            Discharge Care Instructions  (From admission, onward)         Start     Ordered   02/23/20 0000  Leave dressing on - Keep it clean, dry, and intact until clinic visit        02/23/20 1127          Follow-up Information    Avanell Shackleton III, MD. Schedule an appointment as soon as possible for a visit in 1 week(s).   Why: Call to make an appointment for later this week or early next week for a wound check of the right arm Contact information: [address] [place] [address] [phone]        Primary care MD. Schedule an appointment as soon as possible for a visit in 1 week(s).              Allergies  Allergen Reactions  . Codeine Nausea And Vomiting     Other Procedures/Studies: DG Chest 2 View  Result Date: 02/19/2020 CLINICAL DATA:  Infection, IV drug use EXAM: CHEST - 2 VIEW COMPARISON:  04/20/2017 FINDINGS: The heart size and mediastinal contours are within normal limits. Both lungs are clear. The visualized skeletal structures are unremarkable. IMPRESSION: No active cardiopulmonary disease. Electronically  Signed   By: Fidela Salisbury MD   On: 02/19/2020 21:56   CT Extrem Up Entire Arm R W/CM  Result Date: 02/20/2020 CLINICAL DATA:  Soft tissue infection.  Cellulitis.  Forearm DVT. EXAM: CT OF THE UPPER RIGHT EXTREMITY WITH CONTRAST TECHNIQUE: Multidetector CT imaging of the upper right extremity was performed according to the standard protocol following intravenous contrast administration. COMPARISON:  None. CONTRAST:  144m OMNIPAQUE IOHEXOL 300 MG/ML  SOLN FINDINGS: Bones/Joint/Cartilage There is no acute displaced fracture. No CT evidence for osteomyelitis. Ligaments Suboptimally assessed by CT. Muscles and Tendons No large intramuscular hematoma. Soft tissues  There are mildly enlarged right axillary lymph nodes. There is subcutaneous edema involving the patient's forearm. There is associated skin thickening. There are no pockets of subcutaneous gas. There is extensive soft tissue swelling about the patient's dorsal right hand with findings suspicious for a developing abscess within the dorsal subcutaneous soft tissues at the level of the wrist (axial series 8, image 672 and coronal series 9, image 112). This collection measures approximately 2 by 0.6 by 2.4 cm. This collection is suboptimally evaluated secondary to extensive streak artifact. IMPRESSION: 1. No acute osseous abnormality. 2. There is subcutaneous soft tissue edema involving the patient's forearm and hand. This is consistent with cellulitis in the appropriate clinical setting. 3. Findings are suspicious for a small subcutaneous fluid collection involving the dorsal soft tissues at the level of the patient's wrist. This is concerning for developing abscess. This finding is suboptimally evaluated secondary to extensive streak artifact. A dedicated ultrasound would be useful for further evaluation. 4. Mildly enlarged, likely reactive lymph nodes in the right axilla. Electronically Signed   By: Constance Holster M.D.   On: 02/20/2020 01:02      TODAY-DAY OF DISCHARGE:  Subjective:   Jason Wolf today has no headache,no chest abdominal pain,no new weakness tingling or numbness, feels much better wants to go home today.   Objective:   Blood pressure 125/80, pulse 82, temperature 98.2 F (36.8 C), temperature source Oral, resp. rate 18, height 6' (1.829 m), weight (!) 125.1 kg, SpO2 96 %.  Intake/Output Summary (Last 24 hours) at 02/23/2020 1127 Last data filed at 02/23/2020 0900 Gross per 24 hour  Intake 1663.4 ml  Output 4205 ml  Net -2541.6 ml   Filed Weights   02/19/20 2114 02/22/20 2138  Weight: (!) 117.9 kg (!) 125.1 kg    Exam: Awake Alert, Oriented *3, No new F.N deficits, Normal affect [place].AT,PERRAL Supple Neck,No JVD, No cervical lymphadenopathy appriciated.  Symmetrical Chest wall movement, Good air movement bilaterally, CTAB RRR,No Gallops,Rubs or new Murmurs, No Parasternal Heave +ve B.Sounds, Abd Soft, Non tender, No organomegaly appriciated, No rebound -guarding or rigidity. No Cyanosis, Clubbing or edema, No new Rash or bruise   PERTINENT RADIOLOGIC STUDIES: No results found.   PERTINENT LAB RESULTS: CBC: Recent Labs    02/21/20 0447 02/22/20 0213  WBC 16.6* 9.9  HGB 12.8* 12.3*  HCT 37.4* 35.4*  PLT 317 321   CMET CMP     Component Value Date/Time   NA 136 02/23/2020 0428   NA 134 (A) 06/30/2015 0000   NA 137 11/10/2014 1132   K 3.4 (L) 02/23/2020 0428   K 4.2 11/10/2014 1132   CL 104 02/23/2020 0428   CL 102 11/10/2014 1132   CO2 22 02/23/2020 0428   CO2 26 11/10/2014 1132   GLUCOSE 215 (H) 02/23/2020 0428   GLUCOSE 78 11/10/2014 1132   BUN 6 02/23/2020 0428   BUN 6 06/30/2015 0000   BUN 11 11/10/2014 1132   CREATININE 0.66 02/23/2020 0428   CREATININE 0.75 11/10/2014 1132   CALCIUM 8.6 (L) 02/23/2020 0428   CALCIUM 9.3 11/10/2014 1132   PROT 6.7 02/19/2020 2129   ALBUMIN 3.7 02/19/2020 2129   AST 18 02/19/2020 2129   ALT 25 02/19/2020 2129   ALKPHOS 77  02/19/2020 2129   BILITOT 0.7 02/19/2020 2129   GFRNONAA >60 02/23/2020 0428   GFRNONAA >60 11/10/2014 1132   GFRAA >60 02/23/2020 0428   GFRAA >60 11/10/2014 1132    GFR Estimated Creatinine Clearance:  169.4 mL/min (by C-G formula based on SCr of 0.66 mg/dL). No results for input(s): LIPASE, AMYLASE in the last 72 hours. No results for input(s): CKTOTAL, CKMB, CKMBINDEX, TROPONINI in the last 72 hours. Invalid input(s): POCBNP No results for input(s): DDIMER in the last 72 hours. No results for input(s): HGBA1C in the last 72 hours. No results for input(s): CHOL, HDL, LDLCALC, TRIG, CHOLHDL, LDLDIRECT in the last 72 hours. No results for input(s): TSH, T4TOTAL, T3FREE, THYROIDAB in the last 72 hours.  Invalid input(s): FREET3 No results for input(s): VITAMINB12, FOLATE, FERRITIN, TIBC, IRON, RETICCTPCT in the last 72 hours. Coags: No results for input(s): INR in the last 72 hours.  Invalid input(s): PT Microbiology: Recent Results (from the past 240 hour(s))  Blood culture (routine x 2)     Status: None (Preliminary result)   Collection Time: 02/20/20 12:20 AM   Specimen: BLOOD LEFT WRIST  Result Value Ref Range Status   Specimen Description   Final    BLOOD LEFT WRIST Performed at [facility], [address] [place], [place] [address]    Special Requests   Final    BOTTLES DRAWN AEROBIC AND ANAEROBIC Blood Culture adequate volume Performed at [facility], [address] [place], [place] [address]    Culture   Final    NO GROWTH 2 DAYS Performed at [facility], [place] [address] [place], [place] [address]    Report Status PENDING  Incomplete  Blood culture (routine x 2)     Status: None (Preliminary result)   Collection Time: 02/20/20 12:30 AM   Specimen: BLOOD LEFT ARM  Result Value Ref Range Status   Specimen Description   Final    BLOOD LEFT ARM Performed at [facility], [address] [place], [place] [address]     Special Requests   Final    BOTTLES DRAWN AEROBIC AND ANAEROBIC Blood Culture adequate volume Performed at [facility], [address] [place], [place] [address]    Culture   Final    NO GROWTH 2 DAYS Performed at [facility], [place] [address] [place], [place] [address]    Report Status PENDING  Incomplete  SARS Coronavirus 2 by RT PCR (hospital order, performed in [facility] hospital lab) Nasopharyngeal Nasopharyngeal Swab     Status: None   Collection Time: 02/20/20  3:50 AM   Specimen: Nasopharyngeal Swab  Result Value Ref Range Status   SARS Coronavirus 2 NEGATIVE NEGATIVE Final    Comment: (NOTE) SARS-CoV-2 target nucleic acids are NOT DETECTED.  The SARS-CoV-2 RNA is generally detectable in upper and lower respiratory specimens during the acute phase of infection. The lowest concentration of SARS-CoV-2 viral copies this assay can detect is 250 copies / mL. A negative result does not preclude SARS-CoV-2 infection and should not be used as the sole basis for treatment or other patient management decisions.  A negative result may occur with improper specimen collection / handling, submission of specimen other than nasopharyngeal swab, presence of viral mutation(s) within the areas targeted by this assay, and inadequate number of viral copies (<250 copies / mL). A negative result must be combined with clinical observations, patient history, and epidemiological information.  Fact Sheet for Patients:   StrictlyIdeas.no  Fact Sheet for Healthcare Providers: BankingDealers.co.za  This test is not yet approved or  cleared by the [place] FDA and has been authorized for detection and/or  diagnosis of SARS-CoV-2 by FDA under an Emergency Use Authorization (EUA).  This EUA will remain in effect (meaning this test can be used) for the duration of the COVID-19 declaration under Section 564(b)(1) of the Act, 21  U.S.C. section 360bbb-3(b)(1), unless the authorization is terminated or revoked sooner.  Performed at [facility], [address], [place], [place] [address]   Surgical pcr screen     Status: None   Collection Time: 02/20/20  5:43 AM   Specimen: Nasal Mucosa; Nasal Swab  Result Value Ref Range Status   MRSA, PCR NEGATIVE NEGATIVE Final   Staphylococcus aureus NEGATIVE NEGATIVE Final    Comment: (NOTE) The Xpert SA Assay (FDA approved for NASAL specimens in patients 39 years of age and older), is one component of a comprehensive surveillance program. It is not intended to diagnose infection nor to guide or monitor treatment. Performed at [facility], [place] [address] [place], [place] [address]   Aerobic/Anaerobic Culture (surgical/deep wound)     Status: None (Preliminary result)   Collection Time: 02/20/20 11:28 AM   Specimen: Abscess  Result Value Ref Range Status   Specimen Description ABSCESS  Final   Special Requests RIGHT HAND SPEC A  Final   Gram Stain   Final    MODERATE WBC PRESENT, PREDOMINANTLY PMN NO ORGANISMS SEEN Performed at [facility], [place] [address], [place], [place] [address]    Culture   Final    RARE GROUP B STREP(S.AGALACTIAE)ISOLATED TESTING AGAINST S. AGALACTIAE NOT ROUTINELY PERFORMED DUE TO PREDICTABILITY OF AMP/PEN/VAN SUSCEPTIBILITY. NO ANAEROBES ISOLATED; CULTURE IN PROGRESS FOR 5 DAYS    Report Status PENDING  Incomplete  Aerobic/Anaerobic Culture (surgical/deep wound)     Status: None (Preliminary result)   Collection Time: 02/20/20 11:46 AM   Specimen: Abscess  Result Value Ref Range Status   Specimen Description ABSCESS  Final   Special Requests RIGHT ANTECUBITAL FOSSA SPEC B  Final   Gram Stain   Final    FEW WBC PRESENT, PREDOMINANTLY PMN NO ORGANISMS SEEN    Culture   Final    CULTURE REINCUBATED FOR BETTER GROWTH Performed at [facility], [place] [address] [place],  [address]    Report Status  PENDING  Incomplete    FURTHER DISCHARGE INSTRUCTIONS:  Get Medicines reviewed and adjusted: Please take all your medications with you for your next visit with your Primary MD  Laboratory/radiological data: Please request your Primary MD to go over all hospital tests and procedure/radiological results at the follow up, please ask your Primary MD to get all Hospital records sent to his/her office.  In some cases, they will be blood work, cultures and biopsy results pending at the time of your discharge. Please request that your primary care M.D. goes through all the records of your hospital data and follows up on these results.  Also Note the following: If you experience worsening of your admission symptoms, develop shortness of breath, life threatening emergency, suicidal or homicidal thoughts you must seek medical attention immediately by calling 911 or calling your MD immediately  if symptoms less severe.  You must read complete instructions/literature along with all the possible adverse reactions/side effects for all the Medicines you take and that have been prescribed to you. Take any new Medicines after you have completely understood and accpet all the possible adverse reactions/side effects.   Do not drive when taking Pain medications or sleeping medications (Benzodaizepines)  Do not  take more than prescribed Pain, Sleep and Anxiety Medications. It is not advisable to combine anxiety,sleep and pain medications without talking with your primary care practitioner  Special Instructions: If you have smoked or chewed Tobacco  in the last 2 yrs please stop smoking, stop any regular Alcohol  and or any Recreational drug use.  Wear Seat belts while driving.  Please note: You were cared for by a hospitalist during your hospital stay. Once you are discharged, your primary care physician will handle any further medical issues. Please note that NO REFILLS for any discharge medications will be  authorized once you are discharged, as it is imperative that you return to your primary care physician (or establish a relationship with a primary care physician if you do not have one) for your post hospital discharge needs so that they can reassess your need for medications and monitor your lab values.  Total Time spent coordinating discharge including counseling, education and face to face time equals 35 minutes.  SignedOren Binet 02/23/2020 11:27 AM

## 2020-02-23 NOTE — Progress Notes (Signed)
DISCHARGE NOTE HOME Beni Turrell to be discharged Home per MD order. Discussed prescriptions and follow up appointments with the patient. Prescriptions given to patient; medication list explained in detail. Patient verbalized understanding.  Skin clean, dry and intact without evidence of skin break down, no evidence of skin tears noted. IV catheter discontinued intact. Site without signs and symptoms of complications. Dressing and pressure applied. Pt denies pain at the site currently. No complaints noted.  Patient free of lines, drains, and wounds.   An After Visit Summary (AVS) was printed and given to the patient. Patient escorted via wheelchair, and discharged home via private auto.  Katrina Stack, RN

## 2020-02-23 NOTE — Progress Notes (Signed)
   Ortho Hand Progress Note  Subjective: No acute events last night.  Improved pain in the right arm  Objective: Vital signs in last 24 hours: Temp:  [98 F (36.7 C)-98.8 F (37.1 C)] 98 F (36.7 C) (07/27 0414) Pulse Rate:  [79-87] 79 (07/27 0414) Resp:  [18-20] 18 (07/27 0414) BP: (126-138)/(72-84) 126/74 (07/27 0414) SpO2:  [89 %-95 %] 94 % (07/27 0414) Weight:  [125.1 kg] 125.1 kg (07/26 2138)  Intake/Output from previous day: 07/26 0701 - 07/27 0700 In: 1782.1 [P.O.:1382; IV Piggyback:400.1] Out: 3926 [Urine:3925; Stool:1] Intake/Output this shift: No intake/output data recorded.  Recent Labs    02/21/20 0447 02/22/20 0213  HGB 12.8* 12.3*   Recent Labs    02/21/20 0447 02/22/20 0213  WBC 16.6* 9.9  RBC 4.39 4.13*  HCT 37.4* 35.4*  PLT 317 321   Recent Labs    02/22/20 0213 02/23/20 0428  NA 135 136  K 3.3* 3.4*  CL 100 104  CO2 25 22  BUN 9 6  CREATININE 0.67 0.66  GLUCOSE 248* 215*  CALCIUM 8.4* 8.6*   No results for input(s): LABPT, INR in the last 72 hours.  Aaox3 nad Resp nonlabored RRR Right upper extremity: Dressings c/d/i. Intact, improved motion to the fingers and wrist. Fingers wwp with bcr. Sensation intact throughout the digits.    Assessment/Plan: #1 right dorsal wrist abscess 2.  Right antecubital fossa abscess  Status post irrigation debridement of the right dorsal wrist and right antecubital fossa.  Date of surgery 02/20/2020  -Continue inpatient care under the medicine service. -Daily dressing changes and reinforcement as needed. -Drains removed -Cultures group B strep.  Gram stain negative -Antibiotics per primary -Encourage digit, wrist and elbow range of motion to help prevent postoperative stiffness.  Right upper extremity elevation and ice packs as needed.  OK for d/c from hand standpoint with close follow up with me once antibiotic plan established.  Jason Wolf 02/23/2020, 7:46 AM  [phone]  [phone]

## 2020-02-23 NOTE — Plan of Care (Signed)
  Problem: Health Behavior/Discharge Planning: Goal: Ability to manage health-related needs will improve Outcome: Progressing   Problem: Coping: Goal: Level of anxiety will decrease Outcome: Progressing   Problem: Pain Managment: Goal: General experience of comfort will improve Outcome: Progressing   

## 2020-02-23 NOTE — Plan of Care (Signed)
°  RD consulted for nutrition education regarding diabetes.   Lab Results  Component Value Date   HGBA1C 11.0 (H) 02/20/2020    RD provided "Carbohydrate Counting for People with Diabetes" handout from the Academy of Nutrition and Dietetics. Discussed different food groups and their effects on blood sugar, emphasizing carbohydrate-containing foods. Provided list of carbohydrates and recommended serving sizes of common foods.  Reviewed pt's dietary recall. Discussed importance of controlled and consistent carbohydrate intake throughout the day. Provided examples of ways to balance meals/snacks and encouraged intake of high-fiber, whole grain complex carbohydrates. Teach back method used.  Expect good compliance.  Body mass index is 37.4 kg/m. Pt meets criteria for obesity based on current BMI.  Current diet order is carbohydrate modified, patient is consuming approximately 100% of meals at this time. Labs and medications reviewed. No further nutrition interventions warranted at this time. RD contact information provided. If additional nutrition issues arise, please re-consult RD.  Jason Gavia, MS, RD, LDN RD pager number and weekend/on-call pager number located in [place].

## 2020-02-24 LAB — AEROBIC/ANAEROBIC CULTURE W GRAM STAIN (SURGICAL/DEEP WOUND)

## 2020-02-25 LAB — CULTURE, BLOOD (ROUTINE X 2)
Culture: NO GROWTH
Culture: NO GROWTH
Special Requests: ADEQUATE
Special Requests: ADEQUATE

## 2020-02-25 LAB — AEROBIC/ANAEROBIC CULTURE W GRAM STAIN (SURGICAL/DEEP WOUND)

## 2020-03-10 ENCOUNTER — Inpatient Hospital Stay: Payer: Self-pay | Admitting: Physician Assistant

## 2020-03-30 NOTE — Progress Notes (Signed)
Patient ID: Jason Wolf, male   DOB: 12-03-1980, 40 y.o.   MRN: [id number]   Virtual Visit via Telephone Note  I connected with Jason Wolf on 03/31/20 at  2:10 PM EDT by telephone and verified that I am speaking with the correct person using two identifiers.   I discussed the limitations, risks, security and privacy concerns of performing an evaluation and management service by telephone and the availability of in person appointments. I also discussed with the patient that there may be a patient responsible charge related to this service. The patient expressed understanding and agreed to proceed.   PATIENT visit by telephone virtually in the context of Covid-19 pandemic. Patient location:  home My Location:  [facility] office Persons on the call:  Me and the patient    History of Present Illness: After hospitalization 7/23-7/27 for cellulitis and hyperglycemia.  He is doing well.  Denies current substance use.  No fever.  cellulitis resolving.  Blood sugars much improved and running about 100-150.  Not having to use much novolog.  Appetite is good.    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  Significant events: 7/23>> admit to [facility]-right hand/wrist infection  Significant studies: 7/24>> CT of right upper extremity: Subcutaneous tissue edema involving patient's forearm and right hand-consistent with cellulitis, small subcutaneous fluid collection involving the dorsal soft tissue at the level of patient's wrist-concerning for developing abscess.  Antimicrobial therapy: Vancomycin: 7/23>>  Microbiology data: 7/24>> blood culture: No growth 7/24>> abscess right hand/antecubital fossa culture:Group B strep  Procedures  : 7/24>> irrigation/debridement of right dorsal wrist abscess, right antecubital fossa abscess, extensor Tina synovectomy of the second, third, fourth and fifth dorsal compartments.  Consults: Orthopedics  Brief Hospital Course: Right hand/forearm soft tissue swelling with abscess formation:Overall improved-still with pain at the operative site-intraoperative cultures positive for group B strep-initially on IV vancomycin but will switch to Ancef-we will transition to Keflex on discharge.  Per hand surgery-okay to discharge.  Continues to have pain-but relatively well controlled-have provided him a few days supply of as needed narcotics.  He is aware that he is not to take other sedative medications while he is on narcotics.  DM-2 (A1c 11.0 on 02/20/2020) with uncontrolled hyperglycemia: Appears noncompliant with medications-stopped taking Metformin several years back-will be discharged on Lantus and sliding scale insulin.  Further optimization will be done by primary care in the outpatient setting.  He was evaluated by diabetic coordinator-RN taught him how to inject insulin.  Diabetic education-including hypoglycemic measures were provided-patient seems comfortable-managing insulin administration at home.  He is agreeable to resume Metformin on discharge as well.  He will follow with PCP for further optimization of his diabetic regimen.  HTN: BP stable-not on any antihypertensives.  Follow with PCP.  Bipolar disorder:Stable-continueDepakote and Zoloft  Polysubstance abuse:Mostly IV methamphetamine use-relapsed after 22 months-has been extensively counseled.  Morbid Obesity: Estimated body mass index is 35.26 kg/m as calculated from the following: Height as of this encounter: 6' (1.829 m). Weight as of this encounter: 117.9 kg.  Discharge Diagnoses:  Principal Problem:   Cellulitis of right arm Active Problems:   Diabetes (HCC)   Essential hypertension   Bipolar 2  disorder, major depressive episode (HCC)   IVDU (intravenous drug user)   Polysubstance abuse (HCC)  Cutaneous abscess of right wrist  Observations/Objective: NAD.  A&Ox3  Assessment and Plan: 1. Cellulitis of right arm resolving  2. Type 2 diabetes mellitus with hyperglycemia, unspecified whether long term insulin use (HCC) Much improved based on cbg 100-150(A1C in hospital=11.0) - insulin glargine (LANTUS) 100 UNIT/ML Solostar Pen; Inject 26 Units into the skin daily at 10 pm.  Dispense: 7.8 mL; Refill: 0 - metFORMIN (GLUCOPHAGE) 500 MG tablet; Take 1 tablet (500 mg total) by mouth 2 (two) times daily with a meal.  Dispense: 60 tablet; Refill: 11 - Insulin Pen Needle 32G X 8 MM MISC; Use as directed  Dispense: 100 each; Refill: 0 - insulin aspart (NOVOLOG FLEXPEN) 100 UNIT/ML FlexPen; 0-15 Units, Subcutaneous, 3 times daily with meals CBG < 70: Implement Hypoglycemia measures CBG 70 - 120: 0 units CBG 121 - 150: 2 units CBG 151 - 200: 3 units CBG 201 - 250: 5 units CBG 251 - 300: 8 units CBG 301 - 350: 11 units CBG 351 - 400: 15 units CBG > 400: call MD  Dispense: 15 mL; Refill: 4  3. Essential hypertension Check BP OOO and record and bring to next visit  4. Hyperlipidemia, unspecified hyperlipidemia type Will assess moving forward and begin statin if needed  5. Anxiety and depression Stable/continue - sertraline (ZOLOFT) 50 MG tablet; Take 1 tablet (50 mg total) by mouth daily.  Dispense: 30 tablet; Refill: 3  6. Polysubstance abuse (HCC) I have counseled the patient at length about substance abuse and addiction.  12 step meetings/recovery recommended.  Local 12 step meeting lists were given and attendance was encouraged.  Patient expresses understanding.     Follow Up Instructions: 6-8 weeks with assign PCP   I discussed the assessment and treatment plan with the patient. The patient was provided an opportunity to ask questions and all were answered. The patient  agreed with the plan and demonstrated an understanding of the instructions.   The patient was advised to call back or seek an in-person evaluation if the symptoms worsen or if the condition fails to improve as anticipated.  I provided 17 minutes of non-face-to-face time during this encounter.   Georgian Co, PA-C

## 2020-03-31 ENCOUNTER — Encounter: Payer: Self-pay | Admitting: Physician Assistant

## 2020-03-31 ENCOUNTER — Ambulatory Visit: Payer: Self-pay | Attending: Physician Assistant | Admitting: Physician Assistant

## 2020-03-31 ENCOUNTER — Other Ambulatory Visit: Payer: Self-pay | Admitting: Pharmacist

## 2020-03-31 DIAGNOSIS — E1165 Type 2 diabetes mellitus with hyperglycemia: Secondary | ICD-10-CM

## 2020-03-31 DIAGNOSIS — F1910 Other psychoactive substance abuse, uncomplicated: Secondary | ICD-10-CM

## 2020-03-31 DIAGNOSIS — F419 Anxiety disorder, unspecified: Secondary | ICD-10-CM

## 2020-03-31 DIAGNOSIS — E785 Hyperlipidemia, unspecified: Secondary | ICD-10-CM

## 2020-03-31 DIAGNOSIS — F32A Depression, unspecified: Secondary | ICD-10-CM

## 2020-03-31 DIAGNOSIS — I10 Essential (primary) hypertension: Secondary | ICD-10-CM

## 2020-03-31 DIAGNOSIS — L03113 Cellulitis of right upper limb: Secondary | ICD-10-CM

## 2020-03-31 DIAGNOSIS — F329 Major depressive disorder, single episode, unspecified: Secondary | ICD-10-CM

## 2020-03-31 MED ORDER — INSULIN PEN NEEDLE 32G X 8 MM MISC: 0 refills | Status: DC

## 2020-03-31 MED ORDER — SERTRALINE HCL 50 MG PO TABS: 50.0000 mg | ORAL_TABLET | Freq: Every day | ORAL | 3 refills | Status: DC

## 2020-03-31 MED ORDER — INSULIN GLARGINE 100 UNIT/ML SOLOSTAR PEN: 26.0000 [IU] | PEN_INJECTOR | Freq: Every day | SUBCUTANEOUS | 0 refills | Status: DC

## 2020-03-31 MED ORDER — INSULIN GLARGINE 100 UNIT/ML SOLOSTAR PEN: 26.0000 [IU] | PEN_INJECTOR | Freq: Every day | SUBCUTANEOUS | 1 refills | Status: DC

## 2020-03-31 MED ORDER — NOVOLOG FLEXPEN 100 UNIT/ML ~~LOC~~ SOPN: PEN_INJECTOR | SUBCUTANEOUS | 4 refills | Status: DC

## 2020-03-31 MED ORDER — METFORMIN HCL 500 MG PO TABS: 500.0000 mg | ORAL_TABLET | Freq: Two times a day (BID) | ORAL | 11 refills | Status: DC

## 2020-03-31 MED FILL — !NOVOLOG FLEXPEN SYRINGE 1: 100/ML | 19 days supply | Qty: 9 | Fill #0

## 2020-03-31 MED FILL — METFORMIN HCL 500 MG TABS: 500 | 30 days supply | Qty: 60 | Fill #0

## 2020-03-31 MED FILL — SERTRALINE HCL 50 MG TABLET: 50 | 30 days supply | Qty: 30 | Fill #0

## 2020-03-31 MED FILL — !LANTUS SOLOSTAR 100UNITS/M: 100 | 34 days supply | Qty: 9 | Fill #0

## 2020-03-31 MED FILL — TRUEPLUS PEN NDL 31GX5/16: 31G X 8 MM | 25 days supply | Qty: 100 | Fill #0

## 2020-05-23 ENCOUNTER — Other Ambulatory Visit: Payer: Self-pay | Admitting: Nurse Practitioner

## 2020-05-23 MED FILL — SERTRALINE HCL 50 MG TABLET: 50 | 30 days supply | Qty: 30 | Fill #1

## 2020-05-23 NOTE — Telephone Encounter (Signed)
Requested medication (s) are due for refill today: yes  Requested medication (s) are on the active medication list: historical med  Last refill:  02/19/20  Future visit scheduled: yes  Notes to clinic:  historical provider   Requested Prescriptions  Pending Prescriptions Disp Refills   hydrOXYzine (VISTARIL) 25 MG capsule 30 capsule     Sig: Take 1 capsule (25 mg total) by mouth every 6 (six) hours as needed for anxiety.      Ear, Nose, and Throat:  Antihistamines Passed - 05/23/2020  9:41 AM      Passed - Valid encounter within last 12 months    Recent Outpatient Visits           1 month ago Cellulitis of right arm   [facility] [place], Marzella Schlein, [place]       Future Appointments             In 1 month Claiborne Rigg, NP [facility]

## 2020-05-23 NOTE — Telephone Encounter (Signed)
Copied from CRM [id number]. Topic: Quick Communication - Rx Refill/Question >> May 23, 2020  9:35 AM Marylen Ponto wrote: Medication: hydrOXYzine (VISTARIL) 25 MG capsule  Has the patient contacted their pharmacy? no  Preferred Pharmacy (with phone number or street name): [facility] - [place], [place] - [address]  Phone: [phone]  Fax: [phone]  Agent: Please be advised that RX refills may take up to 3 business days. We ask that you follow-up with your pharmacy.

## 2020-05-24 MED ORDER — HYDROXYZINE PAMOATE 25 MG PO CAPS: 25.0000 mg | ORAL_CAPSULE | Freq: Four times a day (QID) | ORAL | 1 refills | Status: DC | PRN

## 2020-05-25 MED FILL — HYDROXYZINE PAM 25 MG CAP: 25 | 15 days supply | Qty: 60 | Fill #0

## 2020-05-31 ENCOUNTER — Ambulatory Visit: Payer: Self-pay | Admitting: Nurse Practitioner

## 2020-06-21 MED FILL — SERTRALINE HCL 50 MG TABLET: 50 | 30 days supply | Qty: 30 | Fill #2

## 2020-06-29 ENCOUNTER — Other Ambulatory Visit: Payer: Self-pay | Admitting: Pharmacist

## 2020-06-29 ENCOUNTER — Encounter: Payer: Self-pay | Admitting: Nurse Practitioner

## 2020-06-29 ENCOUNTER — Other Ambulatory Visit: Payer: Self-pay

## 2020-06-29 ENCOUNTER — Ambulatory Visit: Payer: Self-pay | Attending: Nurse Practitioner | Admitting: Nurse Practitioner

## 2020-06-29 VITALS — BP 121/71 | HR 92 | Temp 96.5°F | Ht 72.0 in | Wt 256.0 lb

## 2020-06-29 DIAGNOSIS — E1165 Type 2 diabetes mellitus with hyperglycemia: Secondary | ICD-10-CM

## 2020-06-29 DIAGNOSIS — F419 Anxiety disorder, unspecified: Secondary | ICD-10-CM

## 2020-06-29 DIAGNOSIS — F32A Depression, unspecified: Secondary | ICD-10-CM

## 2020-06-29 DIAGNOSIS — I10 Essential (primary) hypertension: Secondary | ICD-10-CM

## 2020-06-29 DIAGNOSIS — L03113 Cellulitis of right upper limb: Secondary | ICD-10-CM

## 2020-06-29 DIAGNOSIS — Z1159 Encounter for screening for other viral diseases: Secondary | ICD-10-CM

## 2020-06-29 DIAGNOSIS — Z23 Encounter for immunization: Secondary | ICD-10-CM

## 2020-06-29 DIAGNOSIS — Z130 Encounter for screening for diseases of the blood and blood-forming organs and certain disorders involving the immune mechanism: Secondary | ICD-10-CM

## 2020-06-29 LAB — POCT GLYCOSYLATED HEMOGLOBIN (HGB A1C): Hemoglobin A1C: 8.1 % — AB (ref 4.0–5.6)

## 2020-06-29 LAB — GLUCOSE, POCT (MANUAL RESULT ENTRY): POC Glucose: 213 mg/dl — AB (ref 70–99)

## 2020-06-29 MED ORDER — HYDROXYZINE PAMOATE 25 MG PO CAPS: 25.0000 mg | ORAL_CAPSULE | Freq: Four times a day (QID) | ORAL | 1 refills | Status: DC | PRN

## 2020-06-29 MED ORDER — TRULICITY 1.5 MG/0.5ML ~~LOC~~ SOAJ: 1.5000 mg | SUBCUTANEOUS | 1 refills | Status: DC

## 2020-06-29 MED ORDER — ATORVASTATIN CALCIUM 20 MG PO TABS
20.0000 mg | ORAL_TABLET | Freq: Every day | ORAL | 3 refills | Status: DC
Start: 2020-06-29 — End: 2020-06-29

## 2020-06-29 MED ORDER — TRUEPLUS LANCETS 28G MISC: 3 refills | Status: DC

## 2020-06-29 MED ORDER — SULFAMETHOXAZOLE-TRIMETHOPRIM 800-160 MG PO TABS: 1.0000 | ORAL_TABLET | Freq: Two times a day (BID) | ORAL | 0 refills | Status: DC

## 2020-06-29 MED ORDER — TRUE METRIX METER W/DEVICE KIT: PACK | 0 refills | Status: DC

## 2020-06-29 MED ORDER — NOVOLOG FLEXPEN 100 UNIT/ML ~~LOC~~ SOPN: 5.0000 [IU] | PEN_INJECTOR | Freq: Three times a day (TID) | SUBCUTANEOUS | 4 refills | Status: DC

## 2020-06-29 MED ORDER — TRUE METRIX BLOOD GLUCOSE TEST VI STRP: ORAL_STRIP | 12 refills | Status: DC

## 2020-06-29 MED ORDER — INSULIN GLARGINE 100 UNIT/ML SOLOSTAR PEN: 26.0000 [IU] | PEN_INJECTOR | Freq: Every day | SUBCUTANEOUS | 1 refills | Status: DC

## 2020-06-29 MED ORDER — PEN NEEDLES 32G X 4 MM MISC: 6 refills | Status: AC

## 2020-06-29 MED ORDER — ATORVASTATIN CALCIUM 20 MG PO TABS: 20.0000 mg | ORAL_TABLET | Freq: Every day | ORAL | 3 refills | Status: DC

## 2020-06-29 MED ORDER — METFORMIN HCL 500 MG PO TABS: 500.0000 mg | ORAL_TABLET | Freq: Two times a day (BID) | ORAL | 3 refills | Status: DC

## 2020-06-29 MED FILL — TRUE METRIX TEST STRIP: 20 days supply | Qty: 100 | Fill #0

## 2020-06-29 MED FILL — TRUEplus 5-BEVEL PEN NEEDLE: 32G X 4 MM | 20 days supply | Qty: 100 | Fill #0

## 2020-06-29 MED FILL — HYDROXYZINE PAM 25 MG CAP: 25 | 15 days supply | Qty: 60 | Fill #0

## 2020-06-29 MED FILL — !TRULICITY 1.5 MG/0.5 ML PE: 1.5 | 28 days supply | Qty: 2 | Fill #0

## 2020-06-29 MED FILL — !LANTUS SOLOSTAR 100UNITS/M: 100 | 34 days supply | Qty: 9 | Fill #0

## 2020-06-29 MED FILL — METFORMIN HCL 500 MG TABS: 500 | 30 days supply | Qty: 60 | Fill #0

## 2020-06-29 MED FILL — !TRUE METRIX BLOOD GLUCOSE: 1 days supply | Qty: 1 | Fill #0

## 2020-06-29 MED FILL — TRUEplus LANCETS 28G MISC: 20 days supply | Qty: 100 | Fill #0

## 2020-06-29 MED FILL — SULFAMETHOXAZOLE-TMP DS TAB: 800-160 | 7 days supply | Qty: 14 | Fill #0

## 2020-06-29 MED FILL — !NOVOLOG FLEXPEN SYRINGE 1: 100/ML | 20 days supply | Qty: 3 | Fill #0

## 2020-06-29 NOTE — Progress Notes (Signed)
Assessment & Plan:  Jason Wolf was seen today for establish care.  Diagnoses and all orders for this visit:  Type 2 diabetes mellitus with hyperglycemia, unspecified whether long term insulin use (HCC) -     Glucose (CBG) -     HgB A1c -     Microalbumin/Creatinine Ratio, Urine -     CMP14+EGFR -     Lipid panel -     Ambulatory referral to Ophthalmology -     Blood Glucose Monitoring Suppl (TRUE METRIX METER) w/Device KIT; Use as instructed. Check blood glucose level by fingerstick 3-5 times per day. -     glucose blood (TRUE METRIX BLOOD GLUCOSE TEST) test strip; Use as instructed. Check blood glucose level by fingerstick 3-5 times per day. -     TRUEplus Lancets 28G MISC; Use as instructed. Check blood glucose level by fingerstick 3-5 times per day. -     insulin glargine (LANTUS) 100 UNIT/ML Solostar Pen; Inject 26 Units into the skin daily at 10 pm. NEEDS PASS -     insulin aspart (NOVOLOG FLEXPEN) 100 UNIT/ML FlexPen; Inject 5 Units into the skin 3 (three) times daily with meals. For blood sugars 0-150 give 0 units of insulin, 151-200 give 2 units of insulin, 201-250 give 4 units, 251-300 give 6 units, 301-350 give 8 units, 351-400 give 10 units,> 400 give 12 units and call M.D. Discussed hypoglycemia protocol. -     atorvastatin (LIPITOR) 20 MG tablet; Take 1 tablet (20 mg total) by mouth daily. -     metFORMIN (GLUCOPHAGE) 500 MG tablet; Take 1 tablet (500 mg total) by mouth 2 (two) times daily with a meal. -     Dulaglutide (TRULICITY) 1.5 IN/8.6VE SOPN; Inject 1.5 mg into the skin once a week. -     Insulin Pen Needle (PEN NEEDLES) 32G X 4 MM MISC; Use as instructed. Inject into the skin 5 times per day Continue blood sugar control as discussed in office today, low carbohydrate diet, and regular physical exercise as tolerated, 150 minutes per week (30 min each day, 5 days per week, or 50 min 3 days per week). Keep blood sugar logs with fasting goal of 90-130 mg/dl, post prandial  (after you eat) less than 180.  For Hypoglycemia: BS <60 and Hyperglycemia BS >400; contact the clinic ASAP. Annual eye exams and foot exams are recommended.   Cellulitis of right arm -     sulfamethoxazole-trimethoprim (BACTRIM DS) 800-160 MG tablet; Take 1 tablet by mouth 2 (two) times daily for 7 days.  Essential hypertension Blood pressure is well controlled today. DASH/Mediterranean Diets are healthier choices for HTN.   Screening for deficiency anemia -     CBC  Need for hepatitis C screening test -     HCV Ab w Reflex to Quant PCR  Anxiety and depression -     Jason Wolf (VISTARIL) 25 MG capsule; Take 1 capsule (25 mg total) by mouth every 6 (six) hours as needed for anxiety.     Patient has been counseled on age-appropriate routine health concerns for screening and prevention. These are reviewed and up-to-date. Referrals have been placed accordingly. Immunizations are up-to-date or declined.    Subjective:   Chief Complaint  Patient presents with  . Establish Care    Pt. is here to establish care.    HPI Jason Wolf 39 y.o. male presents to office today to establish care. He has a history of hypertension, opiate abuse, IV methamphetamine, EtOH abuse,  diabetes and bipolar disorder. Relapsed on methamphetamine in July after being clean for 22 months and require inpatient admission for soft tissue infection of the right wrist and forearm.  At that time he required irrigation and debridement of right dorsal wrist abscess and synovectomy   Today he has complaints of warmth and redness re occurring in the right forearm. There is slight pain. He denies any recent IV drug use.    Diabetes type 2 Past history of noncompliance. He has not been monitoring his blood glucose levels daily.  He has been on Metformin in the past which he stopped taking.  During his hospital admission in July he was started on Lantus and NovoLog sliding scale insulin.  He was also started on  Metformin after tele visit with another provider here on 03/31/2020.  Current medications are Lantus 26 units nightly, Metformin 500 mg twice daily and NovoLog sliding scale.  I am adding Trulicity today. Lab Results  Component Value Date   HGBA1C 8.1 (A) 06/29/2020   Lab Results  Component Value Date   HGBA1C 11.0 (H) 02/20/2020    Bipolar disorder He has been on Jason Wolf and Jason Wolf in the past for this. He sees a behavioral health specialist for this. He takes Jason Wolf for his anxiety.   ESSENTIAL HYPERTENSION Blood pressure is well controlled. Denies chest pain, shortness of breath, palpitations, lightheadedness, dizziness, headaches or BLE edema.  BP Readings from Last 3 Encounters:  06/29/20 121/71  02/23/20 125/80  11/21/19 (!) 143/99   Review of Systems  Constitutional: Negative for fever, malaise/fatigue and weight loss.  HENT: Negative.  Negative for nosebleeds.   Eyes: Negative.  Negative for blurred vision, double vision and photophobia.  Respiratory: Negative.  Negative for cough and shortness of breath.   Cardiovascular: Negative.  Negative for chest pain, palpitations and leg swelling.  Gastrointestinal: Negative.  Negative for heartburn, nausea and vomiting.  Musculoskeletal: Negative.  Negative for myalgias.  Skin:       SEE HPI  Neurological: Negative.  Negative for dizziness, focal weakness, seizures and headaches.  Psychiatric/Behavioral: Positive for depression and substance abuse. Negative for suicidal ideas. The patient is nervous/anxious.     Past Medical History:  Diagnosis Date  . Bipolar disorder ([place])   . Diabetes mellitus without complication ([place])   . ETOH abuse   . Hypertension   . Opiate abuse, episodic [facility])     Past Surgical History:  Procedure Laterality Date  . I & D EXTREMITY Right 02/20/2020   Procedure: IRRIGATION AND DEBRIDEMENT HAND;  Surgeon: Verner Mould, MD;  Location: [facility];  Service: Orthopedics;  Laterality: Right;    . KNEE ARTHROSCOPY      Family History  Problem Relation Age of Onset  . Diabetes type II Mother   . Diabetes Mother     Social History Reviewed with no changes to be made today.   Outpatient Medications Prior to Visit  Medication Sig Dispense Refill  . glucose monitoring kit (FREESTYLE) monitoring kit 1 each by Does not apply route 4 (four) times daily - after meals and at bedtime. 1 month Diabetic Testing Supplies for QAC-QHS accuchecks. 1 each 1  . sertraline (Jason Wolf) 50 MG tablet Take 1 tablet (50 mg total) by mouth daily. 30 tablet 3  . Jason Wolf (VISTARIL) 25 MG capsule Take 1 capsule (25 mg total) by mouth every 6 (six) hours as needed for anxiety. 60 capsule 1  . insulin aspart (NOVOLOG FLEXPEN) 100 UNIT/ML FlexPen 0-15  Units, Subcutaneous, 3 times daily with meals CBG < 70: Implement Hypoglycemia measures CBG 70 - 120: 0 units CBG 121 - 150: 2 units CBG 151 - 200: 3 units CBG 201 - 250: 5 units CBG 251 - 300: 8 units CBG 301 - 350: 11 units CBG 351 - 400: 15 units CBG > 400: call MD 15 mL 4  . Insulin Pen Needle 32G X 8 MM MISC Use as directed 100 each 0  . metFORMIN (GLUCOPHAGE) 500 MG tablet Take 1 tablet (500 mg total) by mouth 2 (two) times daily with a meal. 60 tablet 11  . HYDROcodone-acetaminophen (NORCO/VICODIN) 5-325 MG tablet Take 1-2 tablets by mouth every 6 (six) hours as needed for moderate pain. (Patient not taking: Reported on 06/29/2020) 20 tablet 0  . insulin glargine (LANTUS) 100 UNIT/ML Solostar Pen Inject 26 Units into the skin daily at 10 pm. 9 mL 1   No facility-administered medications prior to visit.    Allergies  Allergen Reactions  . Codeine Nausea And Vomiting       Objective:    BP 121/71 (BP Location: Left Arm, Patient Position: Sitting, Cuff Size: Normal)   Pulse 92   Temp (!) 96.5 F (35.8 C) (Temporal)   Ht 6' (1.829 m)   Wt 256 lb (116.1 kg)   SpO2 99%   BMI 34.72 kg/m  Wt Readings from Last 3 Encounters:  06/29/20 256  lb (116.1 kg)  02/22/20 (!) 275 lb 12.7 oz (125.1 kg)  11/21/19 290 lb (131.5 kg)    Physical Exam Vitals and nursing note reviewed.  Constitutional:      Appearance: He is well-developed.  HENT:     Head: Normocephalic and atraumatic.  Cardiovascular:     Rate and Rhythm: Normal rate and regular rhythm.     Heart sounds: Normal heart sounds. No murmur heard.  No friction rub. No gallop.   Pulmonary:     Effort: Pulmonary effort is normal. No tachypnea or respiratory distress.     Breath sounds: Normal breath sounds. No decreased breath sounds, wheezing, rhonchi or rales.  Chest:     Chest wall: No tenderness.  Abdominal:     General: Bowel sounds are normal.     Palpations: Abdomen is soft.  Musculoskeletal:        General: Normal range of motion.     Cervical back: Normal range of motion.  Skin:    General: Skin is warm and dry.     Findings: Erythema present.     Comments: SEE PHOTO  Neurological:     Mental Status: He is alert and oriented to person, place, and time.     Coordination: Coordination normal.  Psychiatric:        Behavior: Behavior normal. Behavior is cooperative.        Thought Content: Thought content normal.        Judgment: Judgment normal.          Patient has been counseled extensively about nutrition and exercise as well as the importance of adherence with medications and regular follow-up. The patient was given clear instructions to go to ER or return to medical center if symptoms don't improve, worsen or new problems develop. The patient verbalized understanding.   Follow-up: Return in about 6 weeks (around 08/10/2020) for meter check with luke for trulicity then see me in 3 months.   Gildardo Pounds, FNP-BC [facility], [place]   06/29/2020,  3:10 PM

## 2020-06-30 LAB — CBC
Hematocrit: 42.9 % (ref 37.5–51.0)
Hemoglobin: 14.9 g/dL (ref 13.0–17.7)
MCH: 29.4 pg (ref 26.6–33.0)
MCHC: 34.7 g/dL (ref 31.5–35.7)
MCV: 85 fL (ref 79–97)
Platelets: 388 10*3/uL (ref 150–450)
RBC: 5.07 x10E6/uL (ref 4.14–5.80)
RDW: 12.4 % (ref 11.6–15.4)
WBC: 7.5 10*3/uL (ref 3.4–10.8)

## 2020-06-30 LAB — LIPID PANEL
Chol/HDL Ratio: 4.6 ratio (ref 0.0–5.0)
Cholesterol, Total: 215 mg/dL — ABNORMAL HIGH (ref 100–199)
HDL: 47 mg/dL (ref >39)
LDL Chol Calc (NIH): 135 mg/dL — ABNORMAL HIGH (ref 0–99)
Triglycerides: 183 mg/dL — ABNORMAL HIGH (ref 0–149)
VLDL Cholesterol Cal: 33 mg/dL (ref 5–40)

## 2020-06-30 LAB — CMP14+EGFR
ALT: 22 IU/L (ref 0–44)
AST: 23 IU/L (ref 0–40)
Albumin/Globulin Ratio: 1.6 (ref 1.2–2.2)
Albumin: 4.1 g/dL (ref 4.0–5.0)
Alkaline Phosphatase: 83 IU/L (ref 44–121)
BUN/Creatinine Ratio: 11 (ref 9–20)
BUN: 8 mg/dL (ref 6–20)
Bilirubin Total: 0.5 mg/dL (ref 0.0–1.2)
CO2: 24 mmol/L (ref 20–29)
Calcium: 9.5 mg/dL (ref 8.7–10.2)
Chloride: 96 mmol/L (ref 96–106)
Creatinine, Ser: 0.7 mg/dL — ABNORMAL LOW (ref 0.76–1.27)
GFR calc Af Amer: 137 mL/min/{1.73_m2} (ref >59)
GFR calc non Af Amer: 119 mL/min/{1.73_m2} (ref >59)
Globulin, Total: 2.6 g/dL (ref 1.5–4.5)
Glucose: 145 mg/dL — ABNORMAL HIGH (ref 65–99)
Potassium: 4.3 mmol/L (ref 3.5–5.2)
Sodium: 134 mmol/L (ref 134–144)
Total Protein: 6.7 g/dL (ref 6.0–8.5)

## 2020-06-30 LAB — HCV AB W REFLEX TO QUANT PCR: HCV Ab: <0.1 s/co ratio (ref 0.0–0.9)

## 2020-06-30 LAB — MICROALBUMIN / CREATININE URINE RATIO
Creatinine, Urine: 266.5 mg/dL
Microalb/Creat Ratio: 36 mg/g creat — ABNORMAL HIGH (ref 0–29)
Microalbumin, Urine: 96.8 ug/mL

## 2020-06-30 LAB — HCV INTERPRETATION

## 2020-06-30 MED FILL — ATORVASTATIN CALCIUM 20 MG: 20 | 30 days supply | Qty: 30 | Fill #0

## 2020-07-19 MED FILL — SERTRALINE HCL 50 MG TABLET: 50 | 30 days supply | Qty: 30 | Fill #3

## 2020-07-19 MED FILL — HYDROXYZINE PAM 25 MG CAP: 25 | 15 days supply | Qty: 60 | Fill #1

## 2020-07-27 MED FILL — TRULICITY 1.5 MG/0.5 ML PEN: 1.5 | 28 days supply | Qty: 2 | Fill #1

## 2020-08-09 NOTE — Progress Notes (Unsigned)
S:     PCP: Bertram Denver PMH: hypertension, opiate abuse, IV methamphetamine, EtOH abuse, diabetes and bipolar disorder.  Patient arrives in good spirits.  Presents for diabetes evaluation, education, and management Patient was referred and established care with Primary Care Provider on 06/29/20 at which Trulicity 1.5 mg weekly was started.  Today, patient reports ***  Ask about lisinopril 2.5 mg daily and send to pharmacy  A1C = 8.1% December from 11% in July  Check Clinic BG Review medications and adherence (timing of meds, etc.)  Ate or drank anything prior to visit today? At home BGs?  Marland Kitchen Highs . Lows  Hyperglycemia sx (nocturia, neuropathy, visual changes, foot exams) Hypoglycemia symptoms (dizziness, shaky, sweating, hungry, confusion) Diet Exercise  Plan: -Increase Trulicity to 3 mg weekly and reduce Lantus  (did not start on 0.75 mg weekly) -consider increasing to atorv 40 for LDL control -UACR elevated at 36 - can consider low-dose lisinopril for renal protection  Patient reports Diabetes was diagnosed in ***.   Family/Social History:  -Fhx: mother with diabetes -Tobacco: current every day smoker (1 ppd)  Insurance coverage/medication affordability: dnbi  Medication adherence reported *** .   Current diabetes medications include: Trulicity 1.5 mg weekly, Lantus 26 units daily (evenings), Novolog SSI, metformin 500 mg twice daily Current hypertension medications include: none Current hyperlipidemia medications include: atorvastatin 20 mg daily  Patient {Actions; denies-reports:120008} hypoglycemic events.  Patient reported dietary habits: Eats *** meals/day Breakfast:*** Lunch:*** Dinner:*** Snacks:*** Drinks:***  Patient-reported exercise habits: ***   Patient {Actions; denies-reports:120008} nocturia (nighttime urination).  Patient {Actions; denies-reports:120008} neuropathy (nerve pain). Patient {Actions; denies-reports:120008} visual  changes. Patient {Actions; denies-reports:120008} self foot exams.     O:  POCT:   Home fasting blood sugars: ***  2 hour post-meal/random blood sugars: ***.   Lab Results  Component Value Date   HGBA1C 8.1 (A) 06/29/2020   There were no vitals filed for this visit.  Lipid Panel     Component Value Date/Time   CHOL 215 (H) 06/29/2020 1500   TRIG 183 (H) 06/29/2020 1500   HDL 47 06/29/2020 1500   CHOLHDL 4.6 06/29/2020 1500   LDLCALC 135 (H) 06/29/2020 1500   Clinical Atherosclerotic Cardiovascular Disease (ASCVD): No  The ASCVD Risk score Denman George DC Jr., et al., 2013) failed to calculate for the following reasons:   The 2013 ASCVD risk score is only valid for ages 58 to 9    A/P: Diabetes longstanding*** currently ***. Patient is *** able to verbalize appropriate hypoglycemia management plan. Medication adherence appears ***. Control is suboptimal due to ***. -{Meds adjust:18428} basal insulin *** (insulin ***). Patient will continue to titrate 1 unit every *** days if fasting blood sugar > 100mg /dl until fasting blood sugars reach goal or next visit.  -{Meds adjust:18428}  rapid insulin *** (insulin ***) to ***.  -{Meds adjust:18428} GLP-1 *** (generic name***) to ***.  -{Meds adjust:18428} SGLT2-I *** (generic name***) to ***. Counseled on sick day rules for ***. -Extensively discussed pathophysiology of diabetes, recommended lifestyle interventions, dietary effects on blood sugar control -Counseled on s/sx of and management of hypoglycemia -Next A1C anticipated ***.   ASCVD risk - primary prevention in patient with diabetes. Last LDL is not controlled. Aspirin is not indicated.  -{Meds adjust:18428} ***statin *** mg.   Hypertension longstanding*** currently ***.  Blood pressure goal = *** mmHg. Medication adherence ***.  Blood pressure control is suboptimal due to ***. -***  Written patient instructions provided.  Total time in  face to face counseling *** minutes.    Follow up Pharmacist/PCP*** Clinic Visit in ***.     Fabio Neighbors, PharmD, BCPS PGY2 Ambulatory Care Resident [facility]  Pharmacy

## 2020-08-10 ENCOUNTER — Ambulatory Visit: Payer: Self-pay | Admitting: Pharmacist

## 2020-08-17 ENCOUNTER — Other Ambulatory Visit: Payer: Self-pay | Admitting: Nurse Practitioner

## 2020-08-17 ENCOUNTER — Other Ambulatory Visit: Payer: Self-pay | Admitting: Physician Assistant

## 2020-08-17 DIAGNOSIS — F32A Depression, unspecified: Secondary | ICD-10-CM

## 2020-08-17 DIAGNOSIS — F419 Anxiety disorder, unspecified: Secondary | ICD-10-CM

## 2020-08-18 ENCOUNTER — Other Ambulatory Visit: Payer: Self-pay | Admitting: Nurse Practitioner

## 2020-08-18 MED FILL — SERTRALINE HCL 50 MG TABLET: 50 | 30 days supply | Qty: 30 | Fill #0

## 2020-08-18 MED FILL — HYDROXYZINE PAM 25 MG CAP: 25 | 15 days supply | Qty: 60 | Fill #0

## 2020-08-19 ENCOUNTER — Other Ambulatory Visit: Payer: Self-pay | Admitting: Family Medicine

## 2020-08-19 ENCOUNTER — Other Ambulatory Visit: Payer: Self-pay

## 2020-08-19 ENCOUNTER — Encounter: Payer: Self-pay | Admitting: Pharmacist

## 2020-08-19 ENCOUNTER — Ambulatory Visit: Payer: Self-pay | Attending: Nurse Practitioner | Admitting: Pharmacist

## 2020-08-19 DIAGNOSIS — E1165 Type 2 diabetes mellitus with hyperglycemia: Secondary | ICD-10-CM

## 2020-08-19 LAB — GLUCOSE, POCT (MANUAL RESULT ENTRY): POC Glucose: 165 mg/dl — AB (ref 70–99)

## 2020-08-19 MED ORDER — INSULIN GLARGINE 100 UNIT/ML SOLOSTAR PEN: 30.0000 [IU] | PEN_INJECTOR | Freq: Every day | SUBCUTANEOUS | 2 refills | Status: DC

## 2020-08-19 MED ORDER — METFORMIN HCL ER 500 MG PO TB24: 1000.0000 mg | ORAL_TABLET | Freq: Two times a day (BID) | ORAL | 2 refills | Status: DC

## 2020-08-19 MED FILL — TRUE METRIX GLUCOSE TEST ST: 20 days supply | Qty: 100 | Fill #1

## 2020-08-19 NOTE — Progress Notes (Signed)
    S:    PCP: Zelda  No chief complaint on file.  Patient arrives in good spirits. Presents for diabetes evaluation, education, and management. Patient was referred and last seen by Primary Care Provider on 06/29/2021.  At that visit, Trulicity was started for DM. He has a history of medication noncompliance.   Today, pt reports doing well. Patient reports Diabetes was diagnosed ~7-8 yrs ago. He is tolerating his Trulicity. Denies NV, abdominal pain. Denies hx of pancreatitis. Denies personal or fhx of thyroid cancer. Endorses partial compliance with metformin d/t GI side effects. Endorses compliance with Lantus and mealtime sliding scale insulin.   Family/Social History:  - FHx:  DM (mother, MGM) - Tobacco: current 1 PPD smoker  - Alcohol: denies use  - Illicit: pt reports that he remains clean.   Insurance coverage/medication affordability: self pay  Medication adherence reported, however, he does miss metformin occasionally d/t GI side effects.   Current diabetes medications include: Lantus 26 units daily, metformin 500 mg BID, Novolog sliding scale,Trulicity 1.5 mg weekly  Current hyperlipidemia medications include: atorvastatin 20 mg daily   Patient denies hypoglycemic events.  Patient reported dietary habits:  - Reports that he has seen nutritionists and has been counseled on "MyPlate" - He adheres to a diet mostly but admits to struggling with Reese's and sweets occasionally   Patient-reported exercise habits:  - Walks 1 mile daily   Patient denies nocturia (nighttime urination).  Patient reports neuropathy (nerve pain). Patient reports visual changes. Patient reports self foot exams.     O:  POCT: 165  Lab Results  Component Value Date   HGBA1C 8.1 (A) 06/29/2020   There were no vitals filed for this visit.  Lipid Panel     Component Value Date/Time   CHOL 215 (H) 06/29/2020 1500   TRIG 183 (H) 06/29/2020 1500   HDL 47 06/29/2020 1500   CHOLHDL 4.6  06/29/2020 1500   LDLCALC 135 (H) 06/29/2020 1500    Home fasting blood sugars: 92 - 198  7 day avg: 168 14 day: 144   Clinical Atherosclerotic Cardiovascular Disease (ASCVD): No  The ASCVD Risk score Denman George DC Jr., et al., 2013) failed to calculate for the following reasons:   The 2013 ASCVD risk score is only valid for ages 60 to 51    A/P: Diabetes longstanding currently uncontrolled. Patient is able to verbalize appropriate hypoglycemia management plan. Medication adherence appears suboptimal as patient does not take metformin consistently d/t diarrhea. Will change to XR metformin and titrate dose. We will also make a small increase in his basal insulin today.  -Increased dose of Lantus to 30 units daily  -Discontinued metformin regular release.  -Started metformin 500 mg XR. Pt to take 2 tablets (1000mg ) BID.  -Continue Trulicity and Novolog at current doses.  -Extensively discussed pathophysiology of diabetes, recommended lifestyle interventions, dietary effects on blood sugar control -Counseled on s/sx of and management of hypoglycemia -Next A1C anticipated 09/2020.   ASCVD risk - primary prevention in patient with diabetes. Last LDL is not controlled. ASCVD risk score is not >20%  - at least moderate intensity statin indicated. -Continued atorvastatin 20 mg for now.    Written patient instructions provided.  Total time in face to face counseling 15 minutes.   Follow up Pharmacist Clinic Visit in 1 month.     10/2020, PharmD, Butch Penny, CPP Clinical Pharmacist [facility] [phone]

## 2020-08-22 MED FILL — !LANTUS SOLOSTAR 100UNITS/M: 100 | 30 days supply | Qty: 9 | Fill #0

## 2020-08-22 MED FILL — metFORMIN HCL ER 500 MG TB2: 500 | 30 days supply | Qty: 120 | Fill #0

## 2020-09-02 MED FILL — $LANTUS SOLOSTAR 100 UNITS/: 100 | 30 days supply | Qty: 9 | Fill #0

## 2020-09-02 MED FILL — metFORMIN HCL ER 500 MG TB2: 500 | 30 days supply | Qty: 120 | Fill #0

## 2020-09-09 MED FILL — $TRULICITY 1.5 MG/0.5 ML PE: 1.5 | 84 days supply | Qty: 6 | Fill #2

## 2020-09-16 ENCOUNTER — Other Ambulatory Visit: Payer: Self-pay | Admitting: Family Medicine

## 2020-09-16 ENCOUNTER — Other Ambulatory Visit: Payer: Self-pay

## 2020-09-16 ENCOUNTER — Ambulatory Visit: Payer: Self-pay | Attending: Nurse Practitioner | Admitting: Pharmacist

## 2020-09-16 DIAGNOSIS — E1165 Type 2 diabetes mellitus with hyperglycemia: Secondary | ICD-10-CM

## 2020-09-16 MED ORDER — INSULIN GLARGINE 100 UNIT/ML SOLOSTAR PEN: 36.0000 [IU] | PEN_INJECTOR | Freq: Every day | SUBCUTANEOUS | 2 refills | Status: DC

## 2020-09-16 MED FILL — $LANTUS SOLOSTAR 100 UNITS/: 100 | 33 days supply | Qty: 12 | Fill #0

## 2020-09-16 MED FILL — SERTRALINE HCL 50 MG TABLET: 50 | 30 days supply | Qty: 30 | Fill #1

## 2020-09-16 MED FILL — HYDROXYZINE PAM 25 MG CAP: 25 | 15 days supply | Qty: 60 | Fill #1

## 2020-09-16 NOTE — Progress Notes (Signed)
    S:    PCP: Zelda  No chief complaint on file.  Patient arrives in good spirits. Presents for diabetes evaluation, education, and management. Patient was referred and last seen by Primary Care Provider on 06/29/2021.  I saw him on 08/19/2020. We changed his metformin to XR. Additionally, we increased his Lantus to 30 units daily.  Family/Social History:  - FHx:  DM (mother, MGM) - Tobacco: current 1 PPD smoker  - Alcohol: denies use  - Illicit: pt reports that he remains clean.   Insurance coverage/medication affordability: self pay  Medication adherence reported, however, he does miss metformin occasionally d/t GI side effects.   Current diabetes medications include: Lantus 30 units daily, metformin 1000 mg XR BID, Novolog sliding scale (has not required),Trulicity 1.5 mg weekly  Current hyperlipidemia medications include: atorvastatin 20 mg daily   Patient denies hypoglycemic events.  Patient reported dietary habits:  - Reports that he has seen nutritionists and has been counseled on "MyPlate" - He adheres to a diet mostly but admits to struggling with Reese's and sweets occasionally   Patient-reported exercise habits:  - Walks 1 mile daily   Patient denies nocturia (nighttime urination).  Patient reports neuropathy (nerve pain). Patient reports visual changes. Patient reports self foot exams.     O:  POCT: 165  Lab Results  Component Value Date   HGBA1C 8.1 (A) 06/29/2020   There were no vitals filed for this visit.  Lipid Panel     Component Value Date/Time   CHOL 215 (H) 06/29/2020 1500   TRIG 183 (H) 06/29/2020 1500   HDL 47 06/29/2020 1500   CHOLHDL 4.6 06/29/2020 1500   LDLCALC 135 (H) 06/29/2020 1500    Home fasting blood sugars: 110s - 160s 7 day avg: 145 30 day: 144   Clinical Atherosclerotic Cardiovascular Disease (ASCVD): No  The ASCVD Risk score Denman George DC Jr., et al., 2013) failed to calculate for the following reasons:   The 2013 ASCVD risk  score is only valid for ages 22 to 36    A/P: Diabetes longstanding currently uncontrolled. Patient is able to verbalize appropriate hypoglycemia management plan. Medication adherence appears suboptimal as patient does not take metformin consistently d/t diarrhea. Will change to XR metformin and titrate dose. We will also make a small increase in his basal insulin today.  -Increased dose of Lantus to 36 units daily.  -Continued metformin 500 mg XR; pt to take 1000 mg BID.  -Continue Trulicity 1.5 mg weekly. -Stop Novolog. -Extensively discussed pathophysiology of diabetes, recommended lifestyle interventions, dietary effects on blood sugar control -Counseled on s/sx of and management of hypoglycemia -Next A1C anticipated 09/2020.   ASCVD risk - primary prevention in patient with diabetes. Last LDL is not controlled. ASCVD risk score is not >20%  - at least moderate intensity statin indicated. -Continued atorvastatin 20 mg for now.    Written patient instructions provided.  Total time in face to face counseling 15 minutes.   Follow up Pharmacist Clinic Visit in 1 month.     Butch Penny, PharmD, Patsy Baltimore, CPP Clinical Pharmacist [facility] [phone]

## 2020-10-07 ENCOUNTER — Ambulatory Visit: Payer: Self-pay | Admitting: Nurse Practitioner

## 2020-10-17 ENCOUNTER — Other Ambulatory Visit: Payer: Self-pay | Admitting: Nurse Practitioner

## 2020-10-17 DIAGNOSIS — F419 Anxiety disorder, unspecified: Secondary | ICD-10-CM

## 2020-10-17 DIAGNOSIS — F32A Depression, unspecified: Secondary | ICD-10-CM

## 2020-10-17 MED FILL — SERTRALINE HCL 50 MG TABLET: 50 | 30 days supply | Qty: 30 | Fill #2

## 2020-10-17 MED FILL — HYDROXYZINE PAM 25 MG CAP: 25 | 15 days supply | Qty: 60 | Fill #0

## 2020-10-17 NOTE — Telephone Encounter (Signed)
Requested Prescriptions  Pending Prescriptions Disp Refills  . hydrOXYzine (VISTARIL) 25 MG capsule [Pharmacy Med Name: HYDROXYZINE PAM 25 MG CAP 25 Capsule] 60 capsule 1    Sig: TAKE 1 CAPSULE (25 MG TOTAL) BY MOUTH EVERY 6 (SIX) HOURS AS NEEDED FOR ANXIETY.     Ear, Nose, and Throat:  Antihistamines Passed - 10/17/2020  8:59 AM      Passed - Valid encounter within last 12 months    Recent Outpatient Visits          1 month ago Type 2 diabetes mellitus with hyperglycemia, unspecified whether long term insulin use [facility])   [place] [facility] [place], Jeannett Senior L, RPH-CPP   1 month ago Type 2 diabetes mellitus with hyperglycemia, unspecified whether long term insulin use [facility])   [place] [facility] [place], Jeannett Senior L, RPH-CPP   3 months ago Type 2 diabetes mellitus with hyperglycemia, unspecified whether long term insulin use [facility])   [place] [facility] [place], Shea Stakes, NP   6 months ago Cellulitis of right arm   [facility] [place], Marzella Schlein, [place]      Future Appointments            In 3 weeks Sharon Seller, Marzella Schlein, PA-C [facility]

## 2020-11-09 ENCOUNTER — Telehealth: Payer: Self-pay | Admitting: Nurse Practitioner

## 2020-11-09 ENCOUNTER — Other Ambulatory Visit: Payer: Self-pay

## 2020-11-09 ENCOUNTER — Ambulatory Visit: Payer: Self-pay | Attending: Nurse Practitioner | Admitting: Physician Assistant

## 2020-11-09 DIAGNOSIS — F419 Anxiety disorder, unspecified: Secondary | ICD-10-CM

## 2020-11-09 DIAGNOSIS — E785 Hyperlipidemia, unspecified: Secondary | ICD-10-CM

## 2020-11-09 DIAGNOSIS — E1165 Type 2 diabetes mellitus with hyperglycemia: Secondary | ICD-10-CM

## 2020-11-09 DIAGNOSIS — F32A Depression, unspecified: Secondary | ICD-10-CM

## 2020-11-09 MED ORDER — SERTRALINE HCL 50 MG PO TABS
ORAL_TABLET | Freq: Every day | ORAL | 2 refills | Status: DC
  Filled 2020-11-09: qty 30, 30d supply, fill #0
  Filled 2021-01-05: qty 30, 30d supply, fill #1
  Filled 2021-02-06: qty 30, 30d supply, fill #2

## 2020-11-09 MED ORDER — INSULIN PEN NEEDLE 32G X 4 MM MISC
6 refills | Status: AC
  Filled 2020-11-09: qty 200, 40d supply, fill #0
  Filled 2020-11-09 – 2020-11-10 (×2): qty 100, 20d supply, fill #0

## 2020-11-09 MED ORDER — DULAGLUTIDE 1.5 MG/0.5ML ~~LOC~~ SOAJ
1.5000 mg | SUBCUTANEOUS | 1 refills | Status: DC
  Filled 2020-11-09: qty 2, 28d supply, fill #0
  Filled 2020-11-09: qty 6, 84d supply, fill #0
  Filled 2021-05-12: qty 6, 84d supply, fill #1

## 2020-11-09 MED ORDER — ATORVASTATIN CALCIUM 20 MG PO TABS
20.0000 mg | ORAL_TABLET | Freq: Every day | ORAL | 3 refills | Status: DC
  Filled 2020-11-09: qty 30, 30d supply, fill #0
  Filled 2021-01-03: qty 30, 30d supply, fill #1
  Filled 2021-02-06: qty 30, 30d supply, fill #2

## 2020-11-09 MED ORDER — METFORMIN HCL ER 500 MG PO TB24
ORAL_TABLET | Freq: Two times a day (BID) | ORAL | 2 refills | Status: DC
  Filled 2020-11-09: qty 120, 30d supply, fill #0
  Filled 2021-04-27: qty 120, 30d supply, fill #1
  Filled 2021-05-28: qty 120, 30d supply, fill #2

## 2020-11-09 MED ORDER — INSULIN GLARGINE 100 UNIT/ML SOLOSTAR PEN
40.0000 [IU] | PEN_INJECTOR | Freq: Every day | SUBCUTANEOUS | 3 refills | Status: DC
  Filled 2020-11-09 (×2): qty 12, 30d supply, fill #0

## 2020-11-09 NOTE — Progress Notes (Signed)
Virtual Visit via Telephone Note  I connected with Jason Wolf on 11/09/20 at  3:30 PM EDT by telephone and verified that I am speaking with the correct person using two identifiers.  Location: Patient: home Provider: [facility] office   I discussed the limitations, risks, security and privacy concerns of performing an evaluation and management service by telephone and the availability of in person appointments. I also discussed with the patient that there may be a patient responsible charge related to this service. The patient expressed understanding and agreed to proceed.   History of Present Illness: Blood sugars running  112 to 320.  usu in the high 100s.  No hypoglycemic episodes.  Taking 36 units lantus and 1.5mg  trulicity once weekly.  No dizziness/vision changes/polyuria/polydipsia   Observations/Objective:  NAD.  A&Ox3   Assessment and Plan:   1. Type 2 diabetes mellitus with hyperglycemia, unspecified whether long term insulin use (HCC) Uncontrolled-increase lantus to 40 units daily.   - atorvastatin (LIPITOR) 20 MG tablet; Take 1 tablet (20 mg total) by mouth daily.  Dispense: 90 tablet; Refill: 3 - Dulaglutide 1.5 MG/0.5ML SOPN; INJECT 1.5 MG INTO THE SKIN ONCE A WEEK.  Dispense: 6 mL; Refill: 1 - insulin glargine (LANTUS) 100 UNIT/ML Solostar Pen; Inject 40 Units into the skin daily.  Dispense: 12 mL; Refill: 3 - metFORMIN (GLUCOPHAGE-XR) 500 MG 24 hr tablet; TAKE 2 TABLETS (1,000 MG TOTAL) BY MOUTH 2 (TWO) TIMES DAILY.  Dispense: 120 tablet; Refill: 2 - Hemoglobin A1c; Future - Comprehensive metabolic panel; Future  2. Anxiety and depression - sertraline (ZOLOFT) 50 MG tablet; TAKE 1 TABLET (50 MG TOTAL) BY MOUTH DAILY.  Dispense: 30 tablet; Refill: 2  3. Hyperlipidemia, unspecified hyperlipidemia type - Comprehensive metabolic panel; Future - Lipid panel; Future    Follow Up Instructions: See PCP in 3 months for chronic conditions   I discussed the assessment and  treatment plan with the patient. The patient was provided an opportunity to ask questions and all were answered. The patient agreed with the plan and demonstrated an understanding of the instructions.   The patient was advised to call back or seek an in-person evaluation if the symptoms worsen or if the condition fails to improve as anticipated.  I provided 14 minutes of non-face-to-face time during this encounter.   Georgian Co, PA-C  Patient ID: Jason Wolf, male   DOB: Jan 19, 1981, 40 y.o.   MRN: [id number]

## 2020-11-09 NOTE — Telephone Encounter (Signed)
Called patient and LVM advising that I was calling from [facility] in regards to his appointment for this afternoon. Unfortunately the provider is not feeling well and his appointment has been switched to virtual.   If patient calls back please advise patient he does not need to come into the office, the provider will give him a phone call to initiate his visit. If he prefers in person he can be rescheduled for another day in person or be advised to go to the mobile unit.   Mobile will be open today 9-12:15 and 2-6 for walk-in visits. Mobile will be located at [address].

## 2020-11-10 ENCOUNTER — Other Ambulatory Visit: Payer: Self-pay

## 2020-11-14 ENCOUNTER — Other Ambulatory Visit: Payer: Self-pay

## 2020-11-14 MED FILL — Hydroxyzine Pamoate Cap 25 MG: ORAL | 15 days supply | Qty: 60 | Fill #0 | Status: AC

## 2020-11-15 ENCOUNTER — Other Ambulatory Visit: Payer: Self-pay

## 2020-11-22 ENCOUNTER — Telehealth: Payer: Self-pay | Admitting: Nurse Practitioner

## 2020-11-22 NOTE — Telephone Encounter (Signed)
Called Pt to get scheduled for 3 month f/u with Zelda. No answer left vm for Pt to call [phone] to make appt for July or August

## 2020-11-22 NOTE — Telephone Encounter (Signed)
-----   Message from Anders Simmonds, [place] sent at 11/09/2020  2:47 PM EDT ----- See PCP in 3 months for chronic conditions

## 2020-11-28 ENCOUNTER — Other Ambulatory Visit: Payer: Self-pay

## 2021-01-03 ENCOUNTER — Other Ambulatory Visit: Payer: Self-pay

## 2021-01-04 ENCOUNTER — Other Ambulatory Visit: Payer: Self-pay

## 2021-01-05 ENCOUNTER — Other Ambulatory Visit: Payer: Self-pay

## 2021-01-05 ENCOUNTER — Other Ambulatory Visit: Payer: Self-pay | Admitting: Nurse Practitioner

## 2021-01-05 DIAGNOSIS — F419 Anxiety disorder, unspecified: Secondary | ICD-10-CM

## 2021-01-05 MED ORDER — HYDROXYZINE PAMOATE 25 MG PO CAPS
ORAL_CAPSULE | ORAL | 1 refills | Status: DC
  Filled 2021-01-05 – 2021-01-17 (×2): qty 60, 15d supply, fill #0

## 2021-01-12 ENCOUNTER — Other Ambulatory Visit: Payer: Self-pay

## 2021-01-17 ENCOUNTER — Other Ambulatory Visit: Payer: Self-pay

## 2021-01-18 ENCOUNTER — Other Ambulatory Visit: Payer: Self-pay

## 2021-01-25 ENCOUNTER — Other Ambulatory Visit: Payer: Self-pay

## 2021-02-06 ENCOUNTER — Other Ambulatory Visit: Payer: Self-pay

## 2021-03-22 ENCOUNTER — Other Ambulatory Visit: Payer: Self-pay

## 2021-03-31 ENCOUNTER — Emergency Department (HOSPITAL_BASED_OUTPATIENT_CLINIC_OR_DEPARTMENT_OTHER): Payer: Self-pay

## 2021-03-31 ENCOUNTER — Encounter (HOSPITAL_BASED_OUTPATIENT_CLINIC_OR_DEPARTMENT_OTHER): Payer: Self-pay

## 2021-03-31 ENCOUNTER — Other Ambulatory Visit: Payer: Self-pay

## 2021-03-31 ENCOUNTER — Emergency Department (HOSPITAL_BASED_OUTPATIENT_CLINIC_OR_DEPARTMENT_OTHER)
Admission: EM | Admit: 2021-03-31 | Discharge: 2021-03-31 | Disposition: A | Discharge status: Home / Self Care | Payer: Self-pay | Attending: Emergency Medicine | Admitting: Emergency Medicine

## 2021-03-31 DIAGNOSIS — W228XXA Striking against or struck by other objects, initial encounter: Secondary | ICD-10-CM | POA: Insufficient documentation

## 2021-03-31 DIAGNOSIS — F17210 Nicotine dependence, cigarettes, uncomplicated: Secondary | ICD-10-CM | POA: Insufficient documentation

## 2021-03-31 DIAGNOSIS — I10 Essential (primary) hypertension: Secondary | ICD-10-CM | POA: Insufficient documentation

## 2021-03-31 DIAGNOSIS — T1490XA Injury, unspecified, initial encounter: Secondary | ICD-10-CM

## 2021-03-31 DIAGNOSIS — M79674 Pain in right toe(s): Secondary | ICD-10-CM

## 2021-03-31 DIAGNOSIS — E119 Type 2 diabetes mellitus without complications: Secondary | ICD-10-CM | POA: Insufficient documentation

## 2021-03-31 DIAGNOSIS — Z794 Long term (current) use of insulin: Secondary | ICD-10-CM | POA: Insufficient documentation

## 2021-03-31 DIAGNOSIS — Z7984 Long term (current) use of oral hypoglycemic drugs: Secondary | ICD-10-CM | POA: Insufficient documentation

## 2021-03-31 DIAGNOSIS — S99921A Unspecified injury of right foot, initial encounter: Secondary | ICD-10-CM | POA: Insufficient documentation

## 2021-03-31 NOTE — ED Provider Notes (Signed)
[facility] Provider Note   CSN: [id number] Arrival date & time: 03/31/21  1206     History Chief Complaint  Patient presents with   Toe Injury    Jason Wolf is a 40 y.o. male.  HPI  40 year old male with history of bipolar disorder, diabetes, EtOH abuse, hypertension, opiate abuse, who presents to the emergency department today for evaluation of toe pain.  States he stubbed his right second and third toes about 2 weeks ago on a door.  Since then he has had pain that has been constant in nature.  Pain is exacerbated by weightbearing and palpation.  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[place])     Patient Active Problem List   Diagnosis Date Noted   Cellulitis of right arm 02/20/2020   IVDU (intravenous drug user) 02/20/2020   Polysubstance abuse ([place]) 02/20/2020   Cutaneous abscess of right wrist 02/20/2020   Bipolar 2 disorder, major depressive episode ([place]) 02/06/2016   Diabetes ([place]) 07/07/2015   Bipolar disorder ([place]) 07/07/2015   Essential hypertension 07/07/2015   Hyperlipemia 07/07/2015    Past Surgical History:  Procedure Laterality Date   I & D EXTREMITY Right 02/20/2020   Procedure: IRRIGATION AND DEBRIDEMENT HAND;  Surgeon: Verner Mould, MD;  Location: [place];  Service: Orthopedics;  Laterality: Right;   KNEE ARTHROSCOPY         Family History  Problem Relation Age of Onset   Diabetes type II Mother    Diabetes Mother     Social History   Tobacco Use   Smoking status: Every Day    Packs/day: 1.00    Types: Cigarettes   Smokeless tobacco: Never  Vaping Use   Vaping Use: Every day  Substance Use Topics   Alcohol use: No    Comment: in rehab   Drug use: Not Currently    Types: Methamphetamines    Comment: was in rehab  and relapsed after 22 months. 02/19/20    Home Medications Prior to Admission medications   Medication Sig Start Date End Date Taking? Authorizing Provider  atorvastatin (LIPITOR) 20 MG tablet Take 1 tablet (20 mg total) by mouth daily. 11/09/20   Argentina Donovan, PA-C  Blood Glucose Monitoring Suppl (TRUE METRIX METER) w/Device KIT Use as instructed. Check blood glucose level by fingerstick 3-5 times per day. 06/29/20   Gildardo Pounds, NP  Dulaglutide 1.5 MG/0.5ML SOPN INJECT 1.5 MG INTO THE SKIN ONCE A WEEK. 11/09/20 11/09/21  Argentina Donovan, PA-C  glucose blood test strip USE AS INSTRUCTED. CHECK BLOOD GLUCOSE LEVEL BY FINGERSTICK 3-5 TIMES PER DAY. 06/29/20 06/29/21  Gildardo Pounds, NP  glucose monitoring kit (FREESTYLE) monitoring kit 1 each by Does not apply route 4 (four) times daily - after meals and at bedtime. 1 month Diabetic Testing Supplies for QAC-QHS accuchecks. 02/23/20   Ghimire, Henreitta Leber, MD  hydrOXYzine (VISTARIL) 25 MG capsule TAKE 1 CAPSULE (25 MG TOTAL) BY MOUTH EVERY 6 (SIX) HOURS AS NEEDED FOR ANXIETY. 01/05/21 01/05/22  Gildardo Pounds, NP  insulin glargine (LANTUS) 100 UNIT/ML Solostar Pen Inject 40 Units into the skin daily. 11/09/20 11/09/21  Argentina Donovan, PA-C  Insulin Pen Needle (PEN NEEDLES) 32G X 4 MM MISC  Use as instructed. Inject into the skin 5 times per day 06/29/20   Gildardo Pounds, NP  Insulin Pen Needle 32G X 4 MM MISC USE AS INSTRUCTED. INJECT INTO THE SKIN 5 TIMES PER DAY 11/09/20 11/09/21  Freeman Caldron M, PA-C  metFORMIN (GLUCOPHAGE-XR) 500 MG 24 hr tablet TAKE 2 TABLETS (1,000 MG TOTAL) BY MOUTH 2 (TWO) TIMES DAILY. 11/09/20 11/09/21  Argentina Donovan, PA-C  sertraline (ZOLOFT) 50 MG tablet TAKE 1 TABLET (50 MG TOTAL) BY MOUTH DAILY. 11/09/20 11/09/21  Argentina Donovan, PA-C  TRUEplus Lancets 28G MISC USE AS INSTRUCTED. CHECK BLOOD GLUCOSE LEVEL BY FINGERSTICK 3-5 TIMES PER DAY. 06/29/20 06/29/21  Gildardo Pounds, NP    Allergies    Codeine  Review  of Systems   Review of Systems  Musculoskeletal:        Toe pain  Neurological:  Negative for numbness.   Physical Exam Updated Vital Signs BP (!) 167/104 (BP Location: Left Arm)   Pulse 94   Temp 98.7 F (37.1 C) (Oral)   Resp 18   Ht 6' (1.829 m)   Wt 124.7 kg   SpO2 99%   BMI 37.30 kg/m   Physical Exam Constitutional:      General: He is not in acute distress.    Appearance: He is well-developed.  Eyes:     Conjunctiva/sclera: Conjunctivae normal.  Cardiovascular:     Rate and Rhythm: Normal rate.  Pulmonary:     Effort: Pulmonary effort is normal.  Musculoskeletal:     Comments: TTP to the base of the right 2nd and 3rd digits  Skin:    General: Skin is warm and dry.  Neurological:     Mental Status: He is alert and oriented to person, place, and time.    ED Results / Procedures / Treatments   Labs (all labs ordered are listed, but only abnormal results are displayed) Labs Reviewed - No data to display  EKG None  Radiology DG Foot Complete Right  Result Date: 03/31/2021 CLINICAL DATA:  Right foot pain, injury, trauma EXAM: RIGHT FOOT COMPLETE - 3+ VIEW COMPARISON:  None. FINDINGS: There is no evidence of fracture or dislocation. There is no evidence of arthropathy or other focal bone abnormality. Soft tissues are unremarkable. Small plantar calcaneal spur. IMPRESSION: Negative. Electronically Signed   By: Jerilynn Mages.  Shick M.D.   On: 03/31/2021 13:06    Procedures Procedures   Medications Ordered in ED Medications - No data to display  ED Course  I have reviewed the triage vital signs and the nursing notes.  Pertinent labs & imaging results that were available during my care of the patient were reviewed by me and considered in my medical decision making (see chart for details).    MDM Rules/Calculators/A&P                          40 year old male presents to the emergency department for evaluation of toe pain after trauma to it 2 weeks ago.  X-rays did not  show any evidence of fracture.  Recommended over-the-counter pain medications.  We will buddy tape the toes here for comfort.  Advised on follow-up with PCP and strict return precautions.  He voiced understanding the plan and reasons to return.  All questions answered.  Patient stable for discharge.   Final Clinical Impression(s) / ED Diagnoses Final diagnoses:  Injury  Pain of toe of right foot    Rx /  DC Orders ED Discharge Orders     None        Rodney Booze, PA-C 94/17/40 [id number]    Lianne Cure, DO 81/85/63 [phone]

## 2021-03-31 NOTE — Discharge Instructions (Addendum)
Your x-rays did not show any evidence of broken bones.  You can use over-the-counter pain medication such as Tylenol and ibuprofen to help with your symptoms.  You can also use cold compresses for 30 minutes at a time to help with pain.   Please follow up with your primary care provider within 5-7 days for re-evaluation of your symptoms. If you do not have a primary care provider, information for a healthcare clinic has been provided for you to make arrangements for follow up care. Please return to the emergency department for any new or worsening symptoms.

## 2021-03-31 NOTE — ED Triage Notes (Signed)
Pt arrives with c/o continued pain X2 weeks in right toes after hitting his foot on the door frame.

## 2021-04-10 ENCOUNTER — Other Ambulatory Visit: Payer: Self-pay

## 2021-04-10 MED ORDER — HYDROXYZINE PAMOATE 25 MG PO CAPS
ORAL_CAPSULE | ORAL | 1 refills | Status: DC
  Filled 2021-04-10: qty 60, 30d supply, fill #0
  Filled 2021-05-28: qty 60, 30d supply, fill #1

## 2021-04-10 MED ORDER — SERTRALINE HCL 50 MG PO TABS
ORAL_TABLET | ORAL | 2 refills | Status: DC
  Filled 2021-04-10: qty 30, 30d supply, fill #0
  Filled 2021-05-08: qty 30, 30d supply, fill #1

## 2021-04-13 ENCOUNTER — Emergency Department (HOSPITAL_COMMUNITY): Payer: Self-pay

## 2021-04-13 ENCOUNTER — Inpatient Hospital Stay (HOSPITAL_COMMUNITY)
Admission: EM | Admit: 2021-04-13 | Discharge: 2021-04-14 | DRG: 069 | Disposition: A | Discharge status: Home / Self Care | Payer: Self-pay | Attending: Family Medicine | Admitting: Family Medicine

## 2021-04-13 ENCOUNTER — Encounter (HOSPITAL_COMMUNITY): Payer: Self-pay | Admitting: Emergency Medicine

## 2021-04-13 DIAGNOSIS — I161 Hypertensive emergency: Secondary | ICD-10-CM

## 2021-04-13 DIAGNOSIS — F1910 Other psychoactive substance abuse, uncomplicated: Secondary | ICD-10-CM | POA: Diagnosis present

## 2021-04-13 DIAGNOSIS — Z20822 Contact with and (suspected) exposure to covid-19: Secondary | ICD-10-CM | POA: Diagnosis present

## 2021-04-13 DIAGNOSIS — Z794 Long term (current) use of insulin: Secondary | ICD-10-CM

## 2021-04-13 DIAGNOSIS — F319 Bipolar disorder, unspecified: Secondary | ICD-10-CM | POA: Diagnosis present

## 2021-04-13 DIAGNOSIS — I639 Cerebral infarction, unspecified: Secondary | ICD-10-CM | POA: Diagnosis present

## 2021-04-13 DIAGNOSIS — R531 Weakness: Secondary | ICD-10-CM

## 2021-04-13 DIAGNOSIS — Z885 Allergy status to narcotic agent status: Secondary | ICD-10-CM

## 2021-04-13 DIAGNOSIS — E785 Hyperlipidemia, unspecified: Secondary | ICD-10-CM | POA: Diagnosis present

## 2021-04-13 DIAGNOSIS — E669 Obesity, unspecified: Secondary | ICD-10-CM | POA: Diagnosis present

## 2021-04-13 DIAGNOSIS — I160 Hypertensive urgency: Secondary | ICD-10-CM | POA: Diagnosis present

## 2021-04-13 DIAGNOSIS — E1165 Type 2 diabetes mellitus with hyperglycemia: Secondary | ICD-10-CM | POA: Diagnosis present

## 2021-04-13 DIAGNOSIS — R471 Dysarthria and anarthria: Secondary | ICD-10-CM | POA: Diagnosis present

## 2021-04-13 DIAGNOSIS — Z6839 Body mass index (BMI) 39.0-39.9, adult: Secondary | ICD-10-CM

## 2021-04-13 DIAGNOSIS — Z79899 Other long term (current) drug therapy: Secondary | ICD-10-CM

## 2021-04-13 DIAGNOSIS — Z833 Family history of diabetes mellitus: Secondary | ICD-10-CM

## 2021-04-13 DIAGNOSIS — G459 Transient cerebral ischemic attack, unspecified: Principal | ICD-10-CM | POA: Diagnosis present

## 2021-04-13 DIAGNOSIS — E119 Type 2 diabetes mellitus without complications: Secondary | ICD-10-CM

## 2021-04-13 DIAGNOSIS — Z7984 Long term (current) use of oral hypoglycemic drugs: Secondary | ICD-10-CM

## 2021-04-13 DIAGNOSIS — F17210 Nicotine dependence, cigarettes, uncomplicated: Secondary | ICD-10-CM | POA: Diagnosis present

## 2021-04-13 DIAGNOSIS — I10 Essential (primary) hypertension: Secondary | ICD-10-CM | POA: Diagnosis present

## 2021-04-13 DIAGNOSIS — E781 Pure hyperglyceridemia: Secondary | ICD-10-CM | POA: Diagnosis present

## 2021-04-13 LAB — BASIC METABOLIC PANEL
Anion gap: 11 (ref 5–15)
BUN: 10 mg/dL (ref 6–20)
CO2: 23 mmol/L (ref 22–32)
Calcium: 9.5 mg/dL (ref 8.9–10.3)
Chloride: 98 mmol/L (ref 98–111)
Creatinine, Ser: 0.89 mg/dL (ref 0.61–1.24)
GFR, Estimated: >60 mL/min (ref >60)
Glucose, Bld: 184 mg/dL — ABNORMAL HIGH (ref 70–99)
Potassium: 4.5 mmol/L (ref 3.5–5.1)
Sodium: 132 mmol/L — ABNORMAL LOW (ref 135–145)

## 2021-04-13 LAB — DIFFERENTIAL
Abs Immature Granulocytes: 0.11 10*3/uL — ABNORMAL HIGH (ref 0.00–0.07)
Basophils Absolute: 0.1 10*3/uL (ref 0.0–0.1)
Basophils Relative: 1 %
Eosinophils Absolute: 0.3 10*3/uL (ref 0.0–0.5)
Eosinophils Relative: 4 %
Immature Granulocytes: 2 %
Lymphocytes Relative: 22 %
Lymphs Abs: 1.5 10*3/uL (ref 0.7–4.0)
Monocytes Absolute: 0.4 10*3/uL (ref 0.1–1.0)
Monocytes Relative: 6 %
Neutro Abs: 4.7 10*3/uL (ref 1.7–7.7)
Neutrophils Relative %: 65 %

## 2021-04-13 LAB — CBC
HCT: 50.6 % (ref 39.0–52.0)
Hemoglobin: 16.6 g/dL (ref 13.0–17.0)
MCH: 30.2 pg (ref 26.0–34.0)
MCHC: 32.8 g/dL (ref 30.0–36.0)
MCV: 92 fL (ref 80.0–100.0)
Platelets: 202 10*3/uL (ref 150–400)
RBC: 5.5 MIL/uL (ref 4.22–5.81)
RDW: 12.4 % (ref 11.5–15.5)
WBC: 8.9 10*3/uL (ref 4.0–10.5)
nRBC: 0 % (ref 0.0–0.2)

## 2021-04-13 LAB — URINALYSIS, ROUTINE W REFLEX MICROSCOPIC
Bilirubin Urine: NEGATIVE
Glucose, UA: 150 mg/dL — AB
Ketones, ur: NEGATIVE mg/dL
Leukocytes,Ua: NEGATIVE
Nitrite: NEGATIVE
Protein, ur: NEGATIVE mg/dL
Specific Gravity, Urine: 1.012 (ref 1.005–1.030)
pH: 7 (ref 5.0–8.0)

## 2021-04-13 LAB — APTT: aPTT: 25 seconds (ref 24–36)

## 2021-04-13 LAB — CBG MONITORING, ED
Glucose-Capillary: 224 mg/dL — ABNORMAL HIGH (ref 70–99)
Glucose-Capillary: 147 mg/dL — ABNORMAL HIGH (ref 70–99)

## 2021-04-13 LAB — RESP PANEL BY RT-PCR (FLU A&B, COVID) ARPGX2
Influenza A by PCR: NEGATIVE
Influenza B by PCR: NEGATIVE
SARS Coronavirus 2 by RT PCR: NEGATIVE

## 2021-04-13 LAB — HEPATIC FUNCTION PANEL
ALT: 30 U/L (ref 0–44)
AST: 38 U/L (ref 15–41)
Albumin: 4.2 g/dL (ref 3.5–5.0)
Alkaline Phosphatase: 65 U/L (ref 38–126)
Bilirubin, Direct: 0.4 mg/dL — ABNORMAL HIGH (ref 0.0–0.2)
Indirect Bilirubin: 0.9 mg/dL (ref 0.3–0.9)
Total Bilirubin: 1.3 mg/dL — ABNORMAL HIGH (ref 0.3–1.2)
Total Protein: 7.8 g/dL (ref 6.5–8.1)

## 2021-04-13 LAB — RAPID URINE DRUG SCREEN, HOSP PERFORMED
Amphetamines: NOT DETECTED
Barbiturates: NOT DETECTED
Benzodiazepines: NOT DETECTED
Cocaine: NOT DETECTED
Opiates: NOT DETECTED
Tetrahydrocannabinol: NOT DETECTED

## 2021-04-13 LAB — TROPONIN I (HIGH SENSITIVITY)
Troponin I (High Sensitivity): 3 ng/L (ref <18)
Troponin I (High Sensitivity): 3 ng/L (ref <18)

## 2021-04-13 LAB — PROTIME-INR
INR: 0.9 (ref 0.8–1.2)
Prothrombin Time: 12.4 seconds (ref 11.4–15.2)

## 2021-04-13 IMAGING — CT CT HEAD W/O CM
3 series · 14 of 47 positions shown, 16 images · non-contrast
Comparison: Report from head CT [DATE] (images currently
unavailable).

CLINICAL DATA: Neuro deficit, acute, stroke suspected.

EXAM:
CT HEAD WITHOUT CONTRAST
TECHNIQUE: Contiguous axial images were obtained from the base of the skull
through the vertex without intravenous contrast.

[Series 2: head wo · axial · 0.47mm/px · z∈[+1406,+1531]mm · 8 of 31 slices shown, 10 images]
[im 3/31  brain]
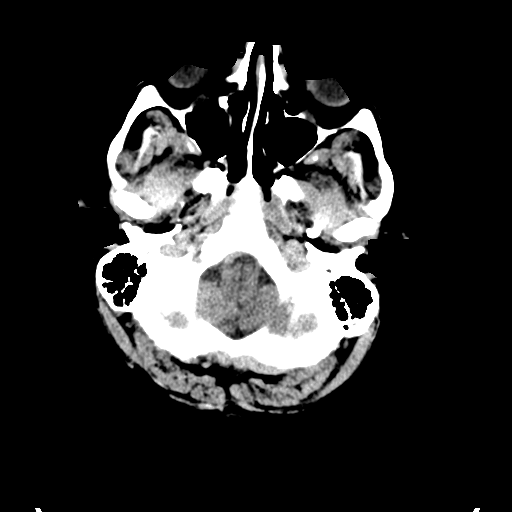
[im 3/31  bone]
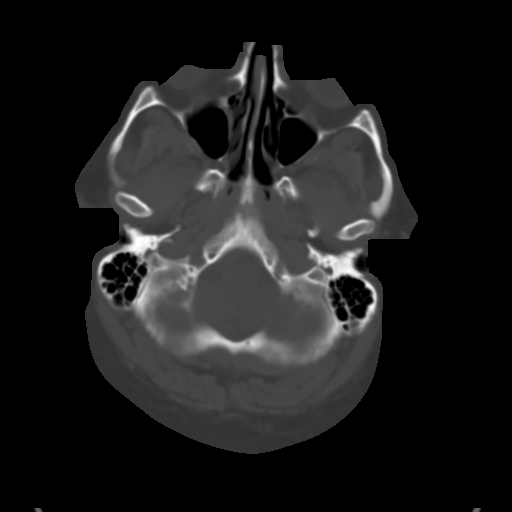
[im 7/31  brain]
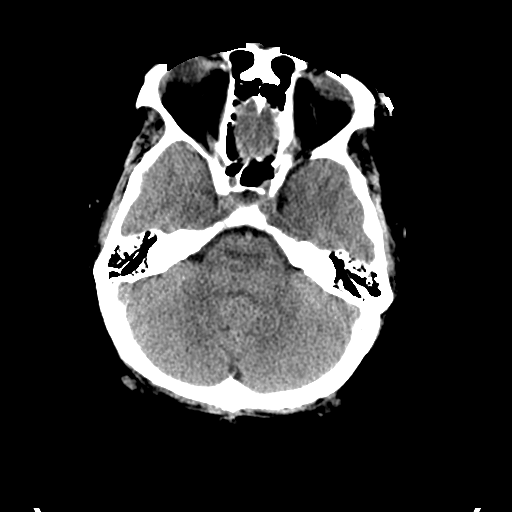
[im 10/31  brain]
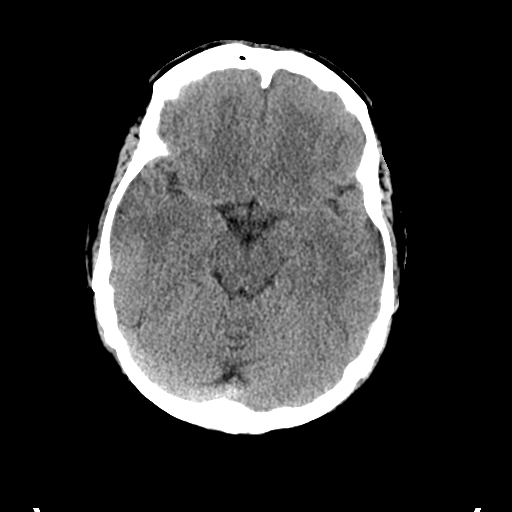
[im 14/31  brain]
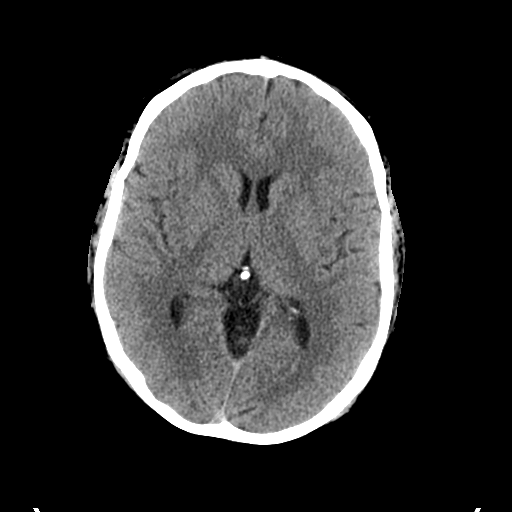
[im 17/31  brain]
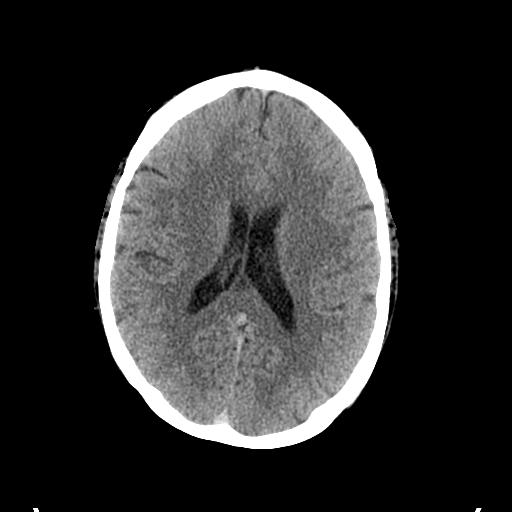
[im 17/31  bone]
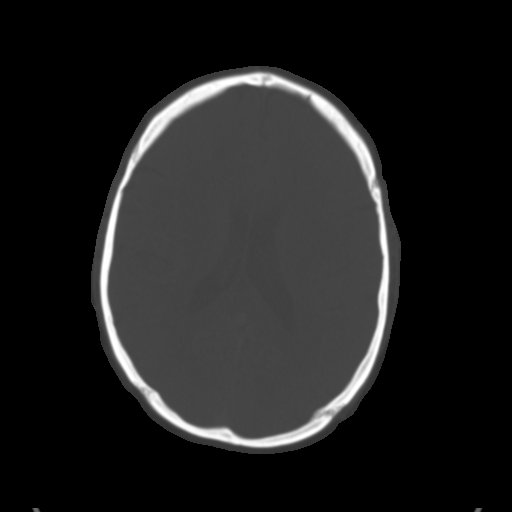
[im 21/31  brain]
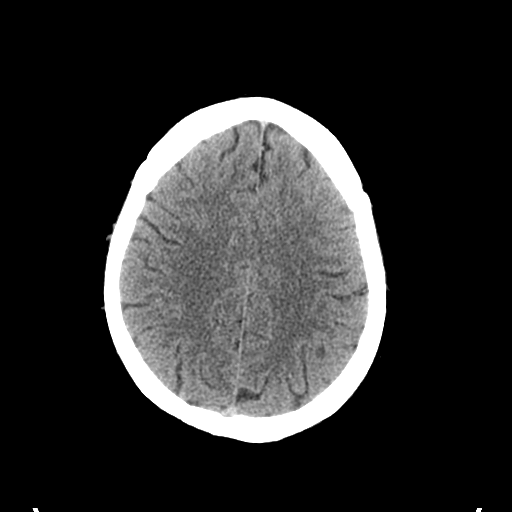
[im 24/31  brain]
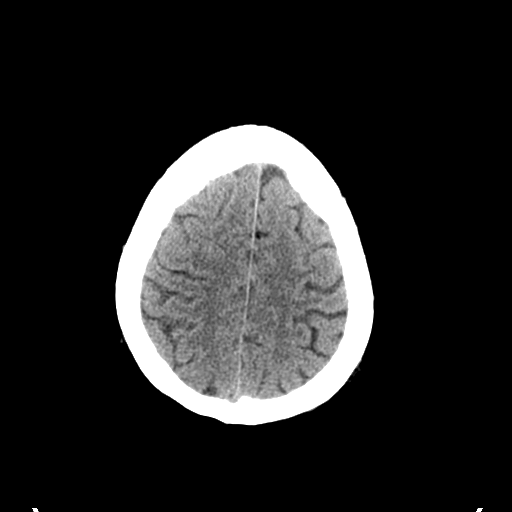
[im 28/31  brain]
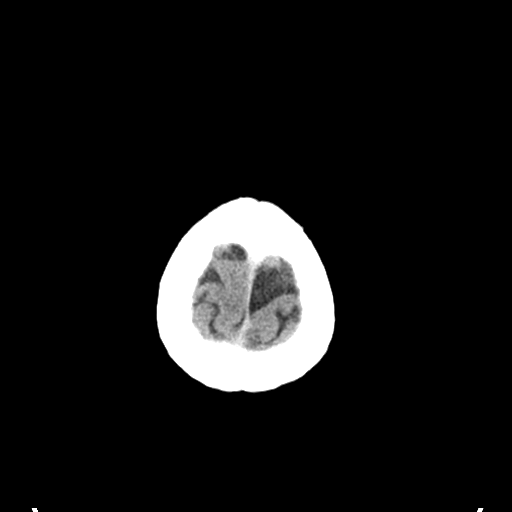

[Series 4: coronal soft tissue · coronal · 0.35mm/px · 3 of 69 slices shown]
[im 23/69  brain]
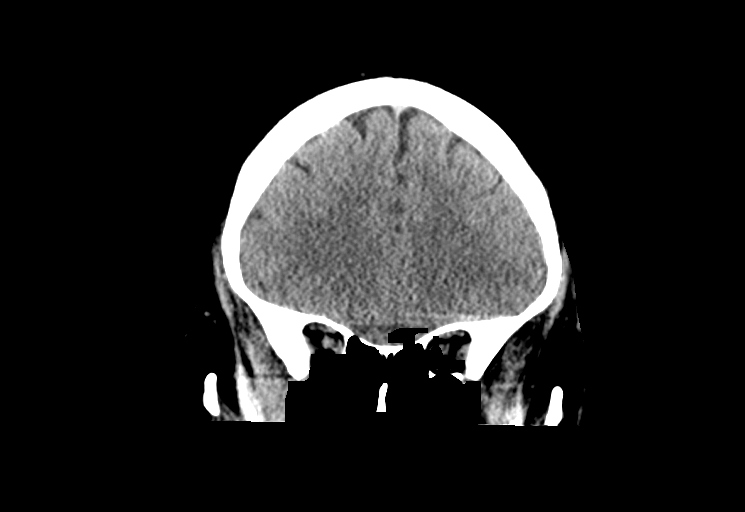
[im 31/69  brain]
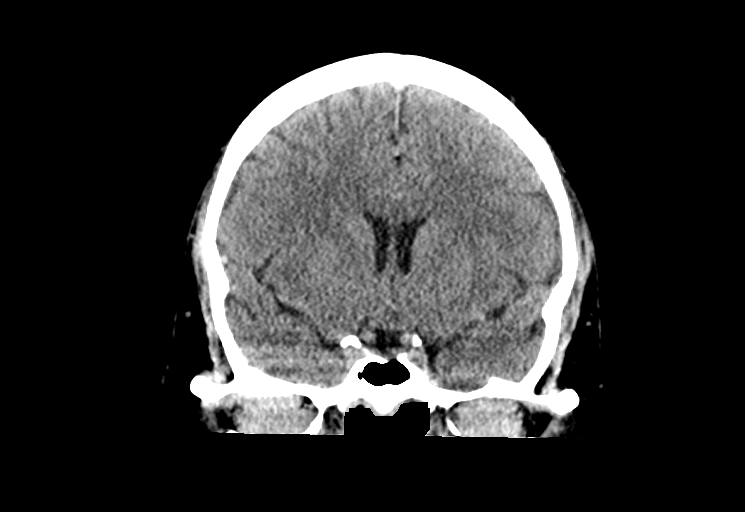
[im 38/69  brain]
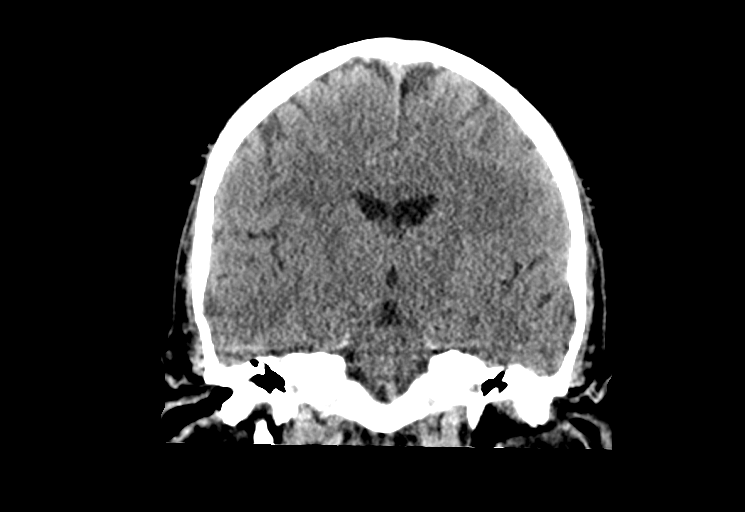

[Series 5: sagittal soft tissue · sagittal · 0.35mm/px · 3 of 52 slices shown]
[im 18/52  brain]
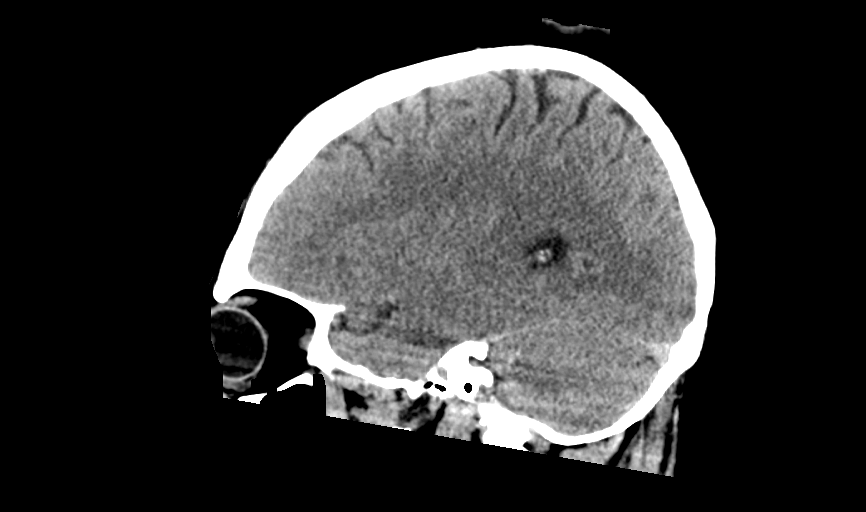
[im 26/52  brain]
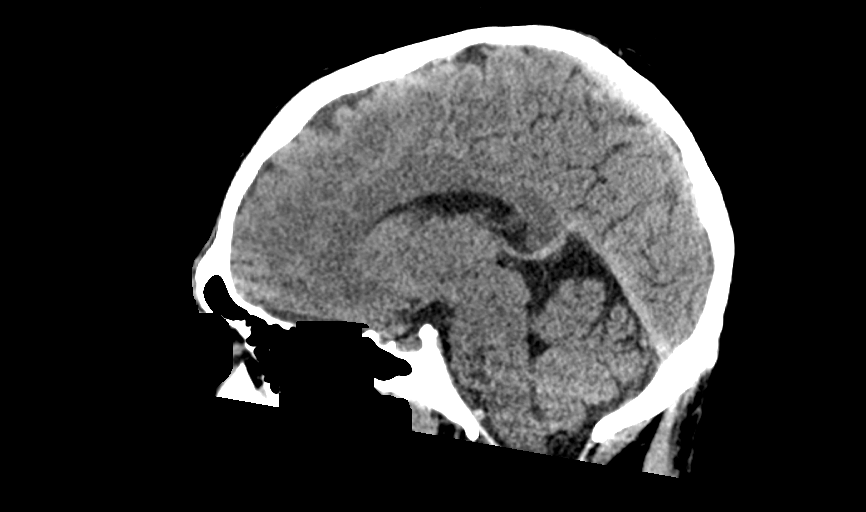
[im 35/52  brain]
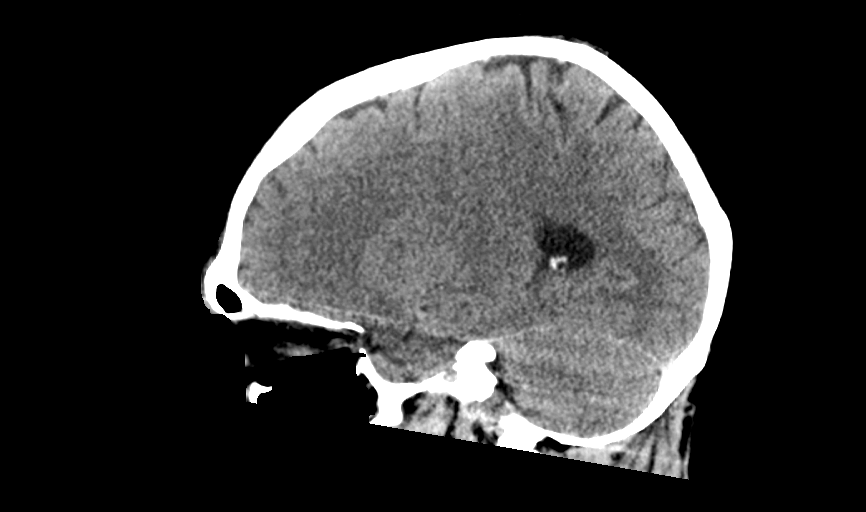

[14 of 47 positions shown; findings below may reference images not displayed]

FINDINGS: Brain:

Cerebral volume is normal.

Small well circumscribed hypodensity within the inferior left basal
ganglia, likely reflecting a prominent perivascular space.

There is no acute intracranial hemorrhage.

No demarcated cortical infarct.

No extra-axial fluid collection.

No evidence of an intracranial mass.

No midline shift.

Vascular: No hyperdense vessel.

Skull: Normal. Negative for fracture or focal lesion.

Sinuses/Orbits: Visualized orbits show no acute finding. Mild
partial opacification of the left ethmoid air cells. Background
trace scattered mucosal thickening elsewhere within the bilateral
ethmoid sinuses.
IMPRESSION: No evidence of acute intracranial abnormality.

Paranasal sinus disease, as described.

## 2021-04-13 IMAGING — MR MR HEAD W/O CM
10 series · 48 of 48 positions shown · non-contrast
Comparison: Head CT same day

CLINICAL DATA: Neurological deficit, acute, stroke suspected.



[Series 5: DWI · axial · 3.0mm · 1.36mm/px · z∈[-83,+67]mm · 8 of 104 slices shown (1 of 4)]
[im 1/104]
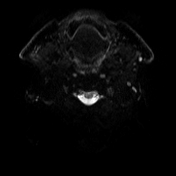
[im 15/104]
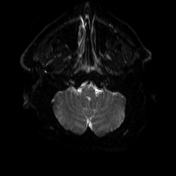
[im 30/104]
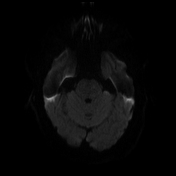
[im 45/104]
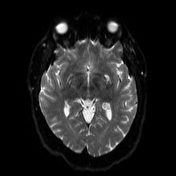
[im 59/104]
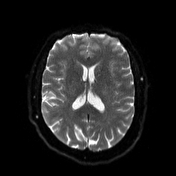
[im 74/104]
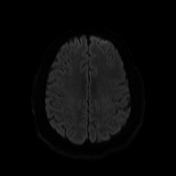
[im 89/104]
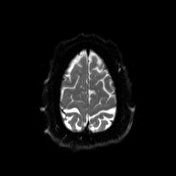
[im 104/104]
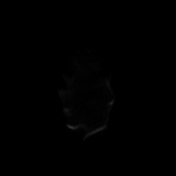

[Series 6: DWI · axial · 3.0mm · 1.36mm/px · z∈[-83,+67]mm · 4 of 52 slices shown (2 of 4)]
[im 1/52]
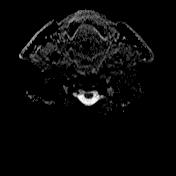
[im 18/52]
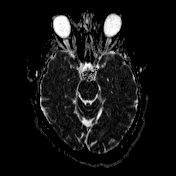
[im 35/52]
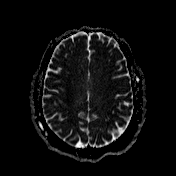
[im 52/52]
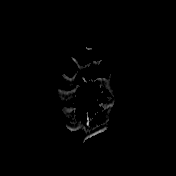

[Series 24: T1 · sagittal · 5.0mm · 0.75mm/px · 2 of 24 slices shown (1 of 2)]
[im 1/24]
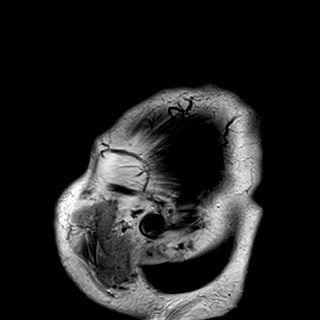
[im 24/24]
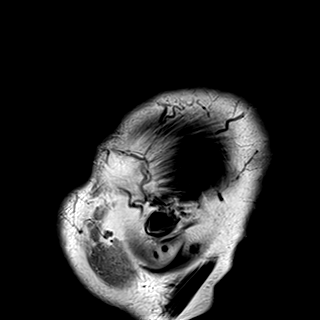

[Series 25: FLAIR · axial · 3.0mm · 0.75mm/px · z∈[-84,+65]mm · 4 of 52 slices shown]
[im 1/52]
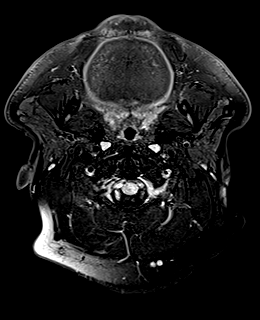
[im 18/52]
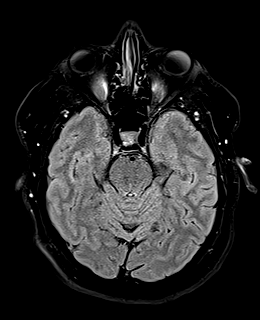
[im 35/52]
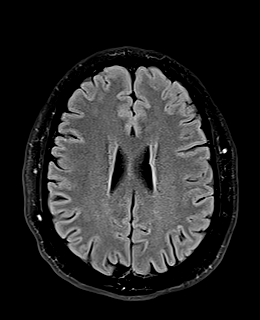
[im 52/52]
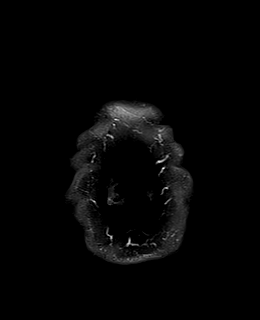

[Series 26: T2 · axial · 5.0mm · 0.62mm/px · z∈[-79,+61]mm · 2 of 23 slices shown (1 of 2)]
[im 1/23]
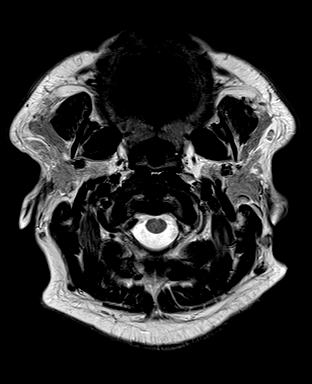
[im 23/23]
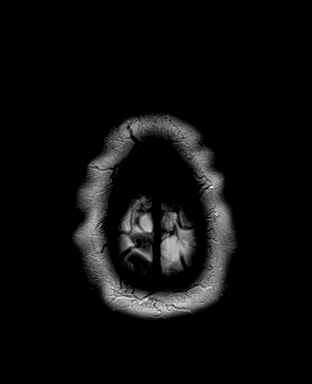

[Series 27: swi_images · axial · 3.0mm · 0.75mm/px · z∈[-90,+71]mm · 4 of 56 slices shown]
[im 1/56]
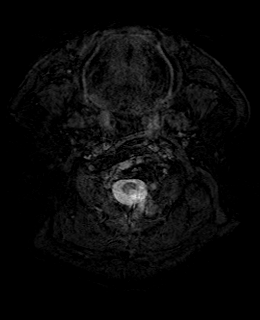
[im 19/56]
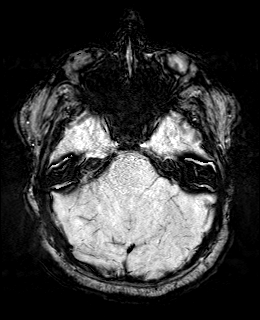
[im 37/56]
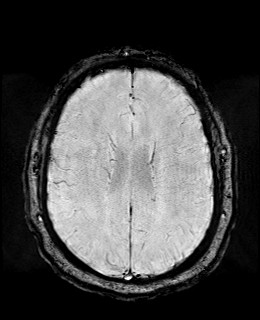
[im 56/56]
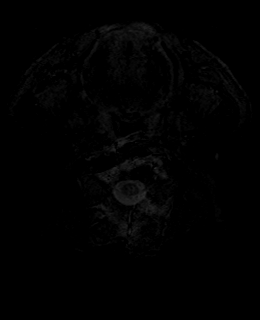

[Series 29: T1 · axial · 1.0mm · 0.94mm/px · z∈[-87,+69]mm · 13 of 160 slices shown (2 of 2)]
[im 1/160]
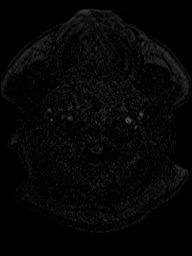
[im 14/160]
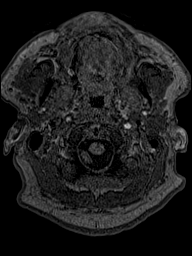
[im 27/160]
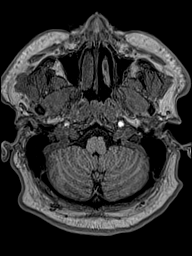
[im 40/160]
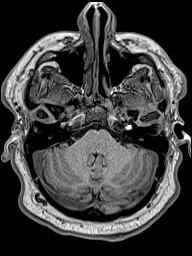
[im 54/160]
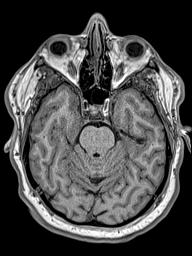
[im 67/160]
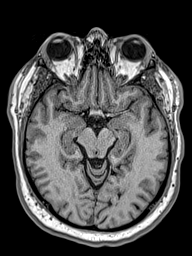
[im 80/160]
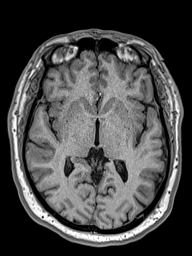
[im 93/160]
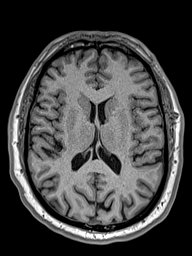
[im 107/160]
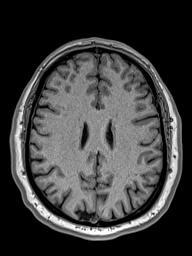
[im 120/160]
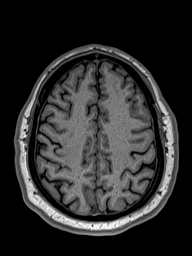
[im 133/160]
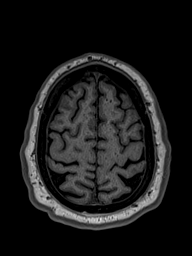
[im 146/160]
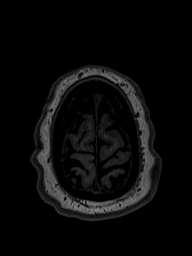
[im 160/160]
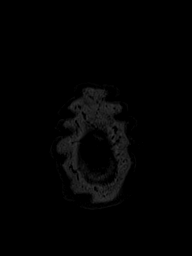

[Series 30: DWI · coronal · 5.0mm · 1.31mm/px · 6 of 72 slices shown (3 of 4)]
[im 1/72]
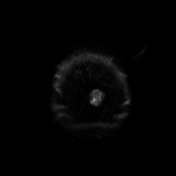
[im 15/72]
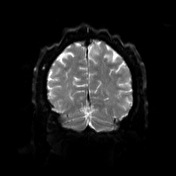
[im 29/72]
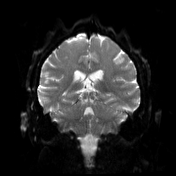
[im 43/72]
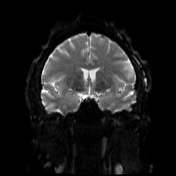
[im 57/72]
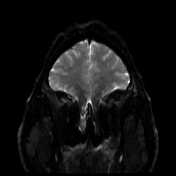
[im 72/72]
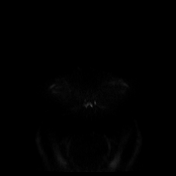

[Series 31: DWI · coronal · 5.0mm · 1.31mm/px · 3 of 36 slices shown (4 of 4)]
[im 1/36]
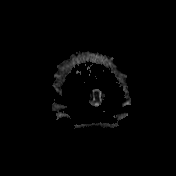
[im 18/36]
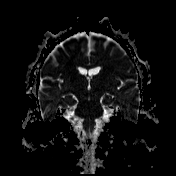
[im 36/36]
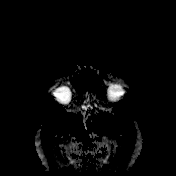

[Series 32: T2 · coronal · 5.0mm · 0.57mm/px · 2 of 30 slices shown (2 of 2)]
[im 1/30]
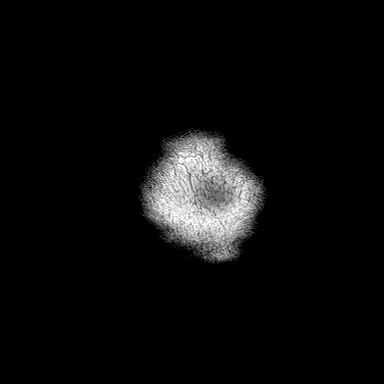
[im 30/30]
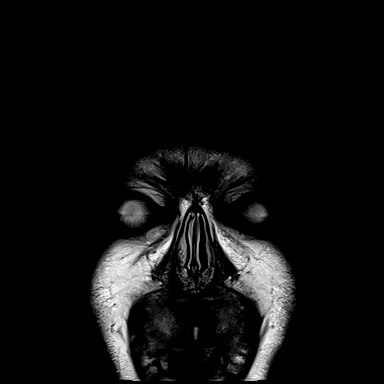

[48 of 48 positions shown; findings below may reference images not displayed]

FINDINGS: MRI HEAD FINDINGS

Brain: Mild chronic small-vessel ischemic change affects the pons.
No focal cerebellar finding. Cerebral hemispheres appear normal
without evidence of old or acute infarction. No mass lesion,
hemorrhage, hydrocephalus or extra-axial collection.

Vascular: Major vessels at the base of the brain show flow.

Skull and upper cervical spine: Negative

Sinuses/Orbits: Clear/normal

Other: None

MRA HEAD FINDINGS

Both internal carotid arteries are widely patent into the brain. No
siphon stenosis. The anterior and middle cerebral vessels are patent
without proximal stenosis, aneurysm or vascular malformation.

Both vertebral arteries are widely patent to the basilar. No basilar
stenosis. Posterior circulation branch vessels appear normal. Right
PCA takes fetal origin from the anterior circulation.

MRA NECK FINDINGS

Neck vessels appear normal. No evidence of atherosclerotic disease
or stenosis. No evidence of dissection. Antegrade flow in all major
vessels.
IMPRESSION: MRI head: No acute finding. Minimal/early chronic appearing
small-vessel change of the pons. Otherwise normal.

MRA of the neck: Normal noncontrast examination.

MRA of the head: Normal.

## 2021-04-13 IMAGING — MR MR MRA NECK W/O CM
3 series · 15 of 48 positions shown · non-contrast
Comparison: Head CT same day

CLINICAL DATA: Neurological deficit, acute, stroke suspected.



[Series 3: tof_2d_tra · axial · 3.5mm · 0.43mm/px · z∈[-237,-94]mm · 9 of 60 slices shown]
[im 1/60]
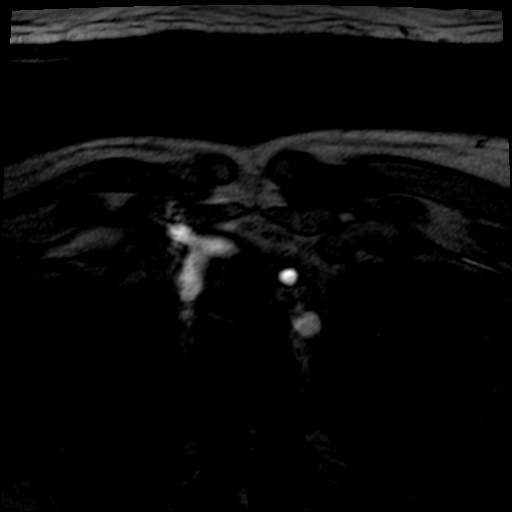
[im 9/60]
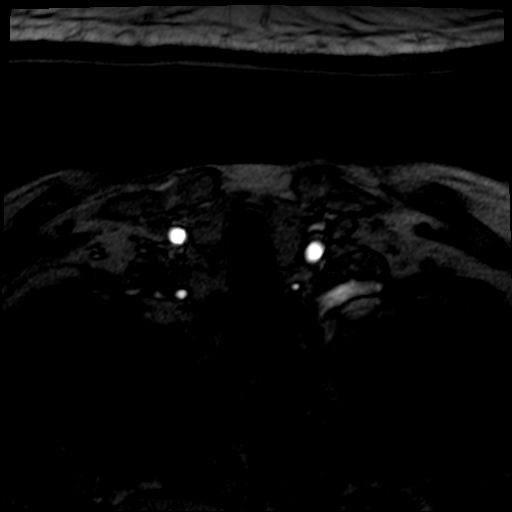
[im 17/60]
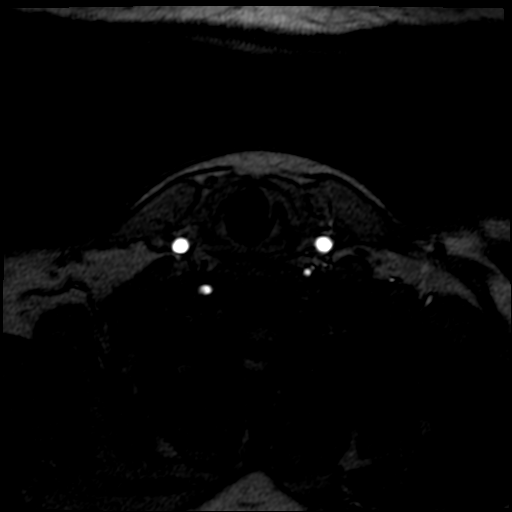
[im 26/60]
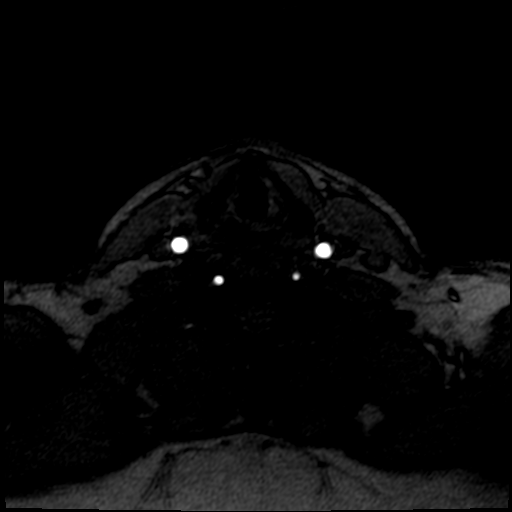
[im 30/60]
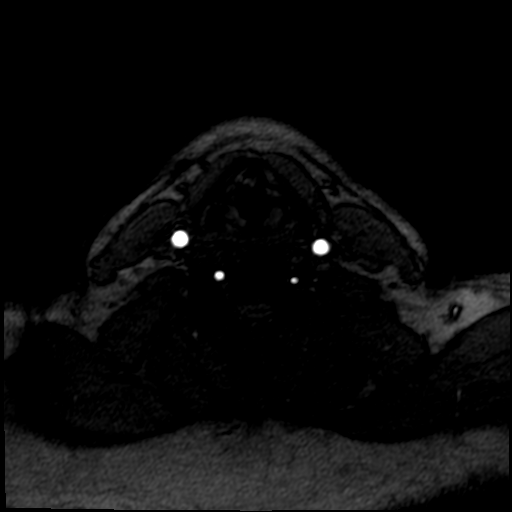
[im 34/60]
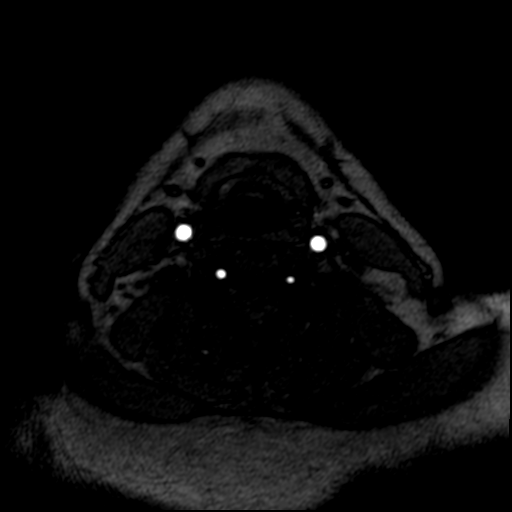
[im 43/60]
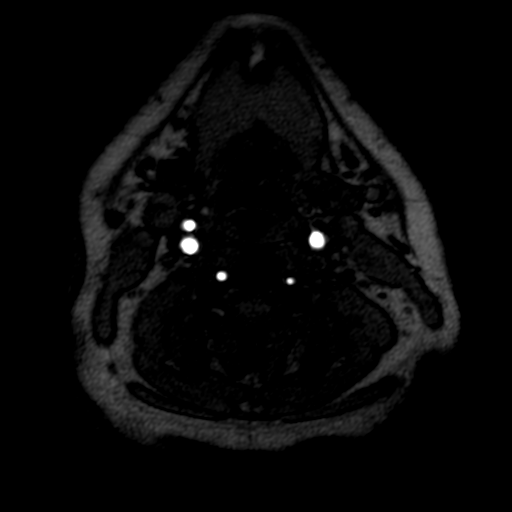
[im 51/60]
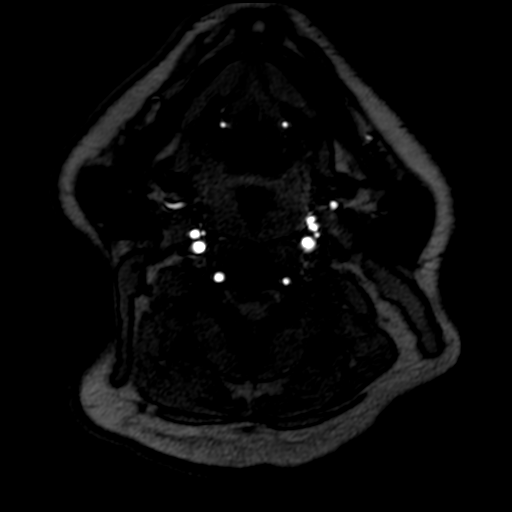
[im 60/60]
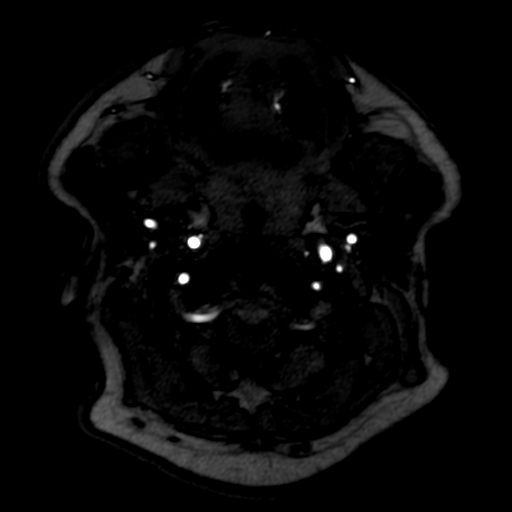

[Series 6: tof_2d_tra_mip_tra · axial · 148.1mm · 0.43mm/px · 1 of 1 slices shown]
[im 1/1]
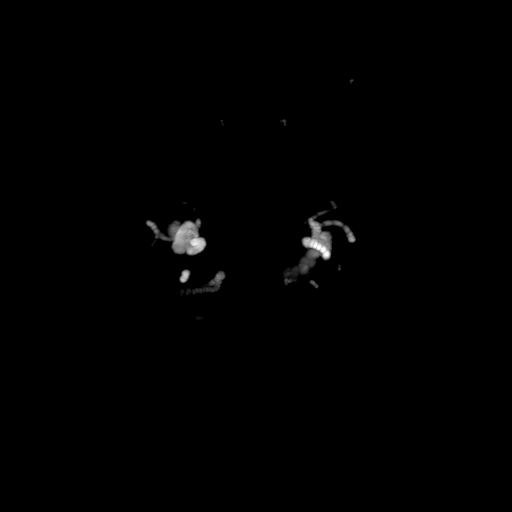

[Series 7: tof_3d_multi-slab-bifurcation · axial · 1.0mm · 0.26mm/px · z∈[-220,-125]mm · 5 of 124 slices shown]
[im 8/124]
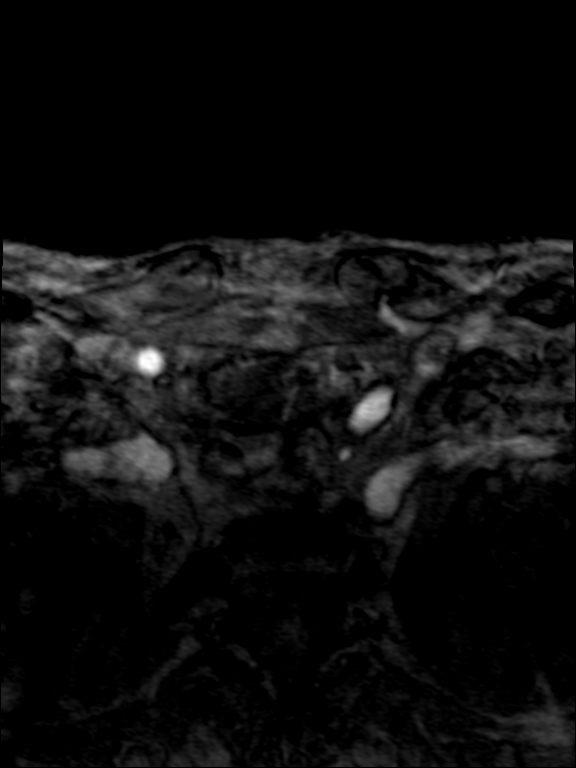
[im 20/124]
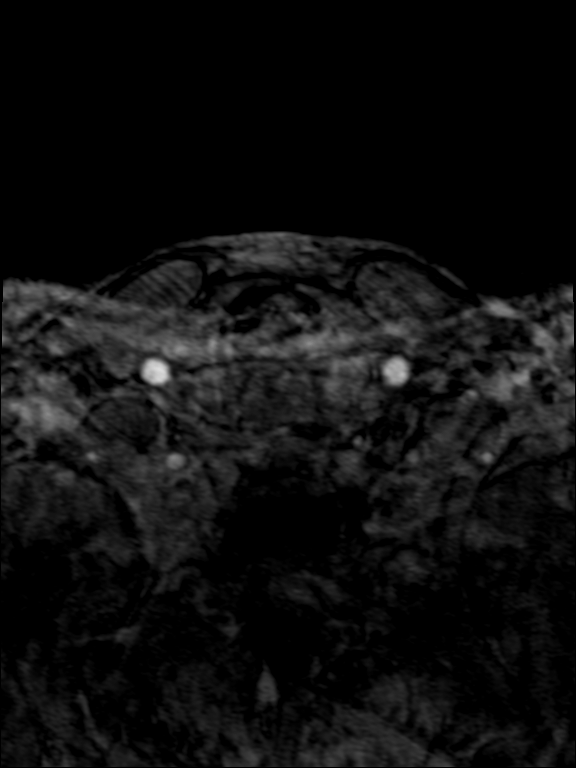
[im 24/124]
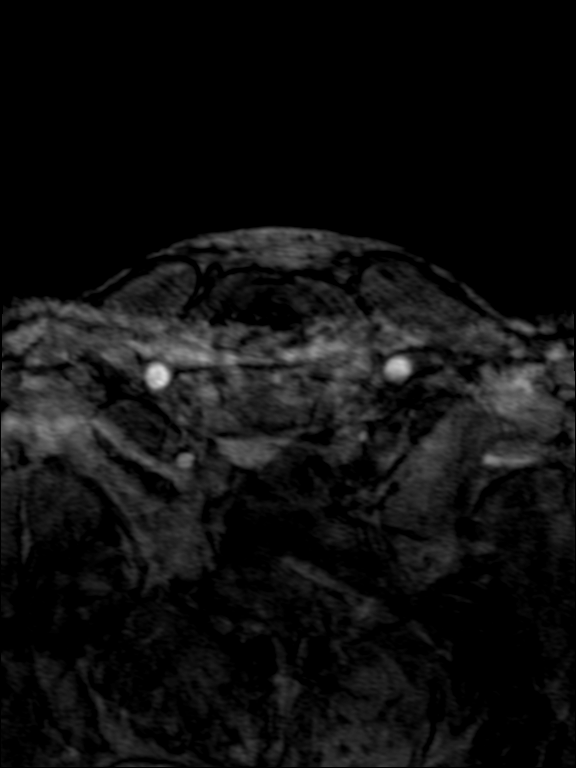
[im 64/124]
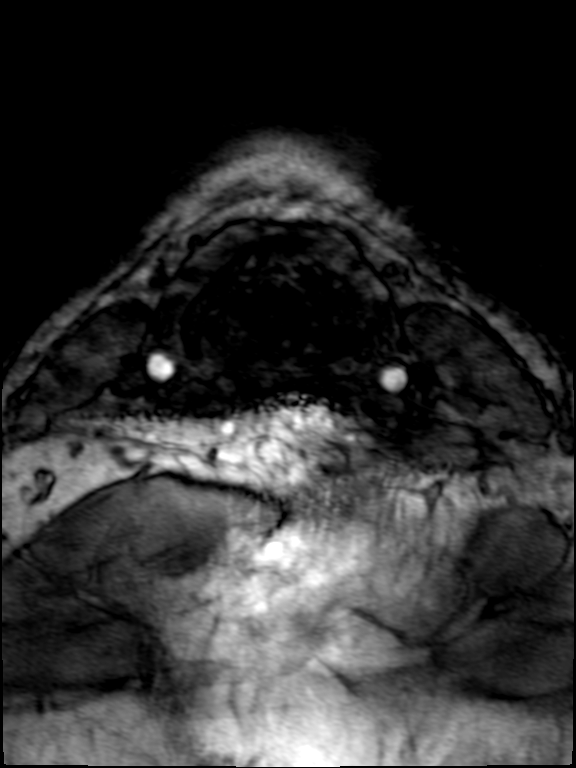
[im 104/124]
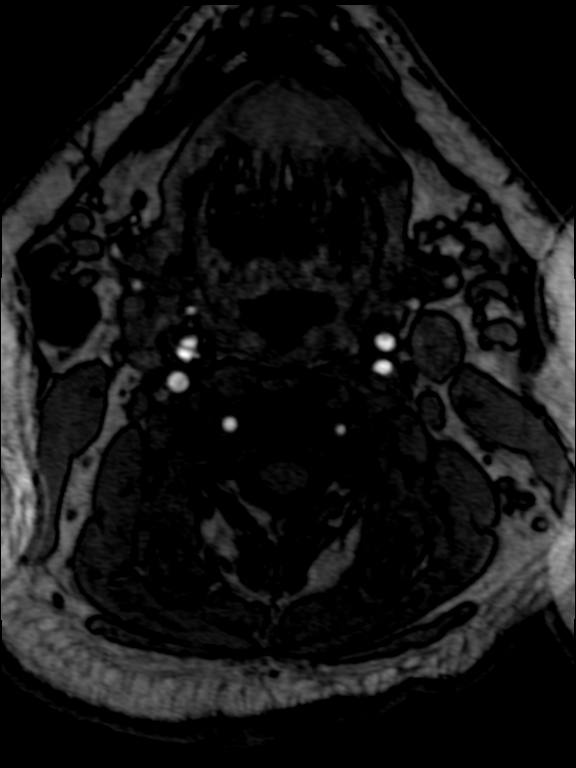

[15 of 48 positions shown; findings below may reference images not displayed]

FINDINGS: MRI HEAD FINDINGS

Brain: Mild chronic small-vessel ischemic change affects the pons.
No focal cerebellar finding. Cerebral hemispheres appear normal
without evidence of old or acute infarction. No mass lesion,
hemorrhage, hydrocephalus or extra-axial collection.

Vascular: Major vessels at the base of the brain show flow.

Skull and upper cervical spine: Negative

Sinuses/Orbits: Clear/normal

Other: None

MRA HEAD FINDINGS

Both internal carotid arteries are widely patent into the brain. No
siphon stenosis. The anterior and middle cerebral vessels are patent
without proximal stenosis, aneurysm or vascular malformation.

Both vertebral arteries are widely patent to the basilar. No basilar
stenosis. Posterior circulation branch vessels appear normal. Right
PCA takes fetal origin from the anterior circulation.

MRA NECK FINDINGS

Neck vessels appear normal. No evidence of atherosclerotic disease
or stenosis. No evidence of dissection. Antegrade flow in all major
vessels.
IMPRESSION: MRI head: No acute finding. Minimal/early chronic appearing
small-vessel change of the pons. Otherwise normal.

MRA of the neck: Normal noncontrast examination.

MRA of the head: Normal.

## 2021-04-13 MED ORDER — HYDROXYZINE HCL 25 MG PO TABS: 25.0000 mg | ORAL_TABLET | Freq: Three times a day (TID) | ORAL | Status: DC | PRN

## 2021-04-13 MED ORDER — ENOXAPARIN SODIUM 60 MG/0.6ML IJ SOSY
60.0000 mg | PREFILLED_SYRINGE | INTRAMUSCULAR | Status: DC
  Administered 2021-04-13: 60 mg via SUBCUTANEOUS
  Filled 2021-04-13: qty 0.6

## 2021-04-13 MED ORDER — HYDRALAZINE HCL 20 MG/ML IJ SOLN: 5.0000 mg | Freq: Four times a day (QID) | INTRAMUSCULAR | Status: DC | PRN

## 2021-04-13 MED ORDER — ATORVASTATIN CALCIUM 20 MG PO TABS
20.0000 mg | ORAL_TABLET | Freq: Every day | ORAL | Status: DC
  Administered 2021-04-13 – 2021-04-14 (×2): 20 mg via ORAL
  Filled 2021-04-13: qty 1
  Filled 2021-04-13: qty 2

## 2021-04-13 MED ORDER — INSULIN ASPART 100 UNIT/ML IJ SOLN
0.0000 [IU] | Freq: Three times a day (TID) | INTRAMUSCULAR | Status: DC
  Administered 2021-04-14: 3 [IU] via SUBCUTANEOUS
  Administered 2021-04-14 (×2): 2 [IU] via SUBCUTANEOUS
  Filled 2021-04-13: qty 0.09

## 2021-04-13 MED ORDER — SERTRALINE HCL 50 MG PO TABS
50.0000 mg | ORAL_TABLET | Freq: Every day | ORAL | Status: DC
  Administered 2021-04-13 – 2021-04-14 (×2): 50 mg via ORAL
  Filled 2021-04-13 (×2): qty 1

## 2021-04-13 MED ORDER — ASPIRIN 325 MG PO TABS
325.0000 mg | ORAL_TABLET | Freq: Once | ORAL | Status: AC
  Administered 2021-04-13: 325 mg via ORAL
  Filled 2021-04-13: qty 1

## 2021-04-13 MED ORDER — INSULIN GLARGINE 100 UNIT/ML ~~LOC~~ SOLN
20.0000 [IU] | Freq: Every day | SUBCUTANEOUS | Status: AC
  Administered 2021-04-13: 20 [IU] via SUBCUTANEOUS
  Filled 2021-04-13: qty 0.2

## 2021-04-13 MED ORDER — INSULIN GLARGINE-YFGN 100 UNIT/ML ~~LOC~~ SOLN
20.0000 [IU] | Freq: Every day | SUBCUTANEOUS | Status: DC
  Filled 2021-04-13: qty 0.2

## 2021-04-13 MED ORDER — ACETAMINOPHEN 325 MG PO TABS: 650.0000 mg | ORAL_TABLET | Freq: Four times a day (QID) | ORAL | Status: DC | PRN

## 2021-04-13 MED ORDER — SENNOSIDES-DOCUSATE SODIUM 8.6-50 MG PO TABS: 1.0000 | ORAL_TABLET | Freq: Every evening | ORAL | Status: DC | PRN

## 2021-04-13 MED ORDER — INSULIN ASPART 100 UNIT/ML IJ SOLN
0.0000 [IU] | Freq: Every day | INTRAMUSCULAR | Status: DC
  Filled 2021-04-13: qty 0.05

## 2021-04-13 MED ORDER — ACETAMINOPHEN 650 MG RE SUPP: 650.0000 mg | Freq: Four times a day (QID) | RECTAL | Status: DC | PRN

## 2021-04-13 NOTE — ED Triage Notes (Signed)
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

## 2021-04-13 NOTE — ED Provider Notes (Addendum)
Emergency Medicine Provider Triage Evaluation Note  Jason Wolf , a 40 y.o. male  was evaluated in triage.  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  No pain.  He had been off the zoloft for two weeks and restarted it Monday. He feels like his speech is week compared to his normal.  He had breakfast this morning.  He denies any history of similar or weakness.  This all started at 1230.  No recent chiropractic adjustments.   No recent alcohol or drug use.    Review of Systems  Positive:  speech changes Negative: Fever, headache  Physical Exam  BP (!) 156/99 (BP Location: Right Arm)   Pulse 95   Temp 98.6 F (37 C) (Oral)   Resp 15   SpO2 96%  Gen:   Awake, mildly diaphoretic Resp:  Normal effort  MSK:   Moves extremities without difficulty  Other:  Edentulous.  Facial movements are symmetric without facial droop.  He does not have apparent word finding difficulty or aphasia.  5/5 strength bilateral upper and lower extremities.  Medical Decision Making  Medically screening exam initiated at 2:21 PM.  Appropriate orders placed.  Burley Kopka was informed that the remainder of the evaluation will be completed by another provider, this initial triage assessment does not replace that evaluation, and the importance of remaining in the ED until their evaluation is complete.  Note: Portions of this report may have been transcribed using voice recognition software. Every effort was made to ensure accuracy; however, inadvertent computerized transcription errors may be present  Patient does not appear to meet code stroke criteria at this point as his symptoms appear too mild to meet criteria for lytics.    Tomasz, Ste[place], PA-C 04/13/21 1430    Tyrus, [place], [place] 04/13/21 1443    Rolan Bucco, MD 04/13/21 1459

## 2021-04-13 NOTE — Plan of Care (Signed)
Discussed with neurology Dr. Iver Nestle recommended to keep patient at [facility] at this

## 2021-04-13 NOTE — ED Provider Notes (Signed)
[facility] DEPT Provider Note   CSN: [id number] Arrival date & time: 04/13/21  1356     History Chief Complaint  Patient presents with   Fatigue    Jason Wolf is a 40 y.o. male.  HPI Patient presents for a transient episode of weakness earlier today.  Episode occurred at 1230.  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  He states that weakness was generalized and he felt dizzy.  He has not had episodes like this before.  Prior to the episode, he had a normal day with normal p.o. intake.  He has not had any recent infectious symptoms.  He has remote history of polysubstance abuse.  Medical history otherwise notable for diabetes.  He recently stopped taking his sertraline for about 2 weeks and started again within the past couple days.  He started again at his previous dose.    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[place])     Patient Active Problem List   Diagnosis Date Noted   Hypertensive emergency, no CHF 04/13/2021   CVA (cerebral vascular accident) ([place]) 04/13/2021   Cellulitis of right arm 02/20/2020   IVDU (intravenous drug user) 02/20/2020   Polysubstance abuse ([place]) 02/20/2020   Cutaneous abscess of right wrist 02/20/2020   Bipolar 2 disorder, major depressive episode ([place]) 02/06/2016   Diabetes ([place]) 07/07/2015   Bipolar disorder ([place]) 07/07/2015   Essential hypertension 07/07/2015   Hyperlipemia 07/07/2015    Past Surgical History:  Procedure Laterality Date   I & D EXTREMITY Right 02/20/2020   Procedure: IRRIGATION AND DEBRIDEMENT HAND;  Surgeon: Verner Mould, MD;  Location: [facility];  Service: Orthopedics;  Laterality: Right;   KNEE ARTHROSCOPY         Family History  Problem Relation Age of Onset   Diabetes type II Mother    Diabetes Mother      Social History   Tobacco Use   Smoking status: Every Day    Packs/day: 1.00    Types: Cigarettes   Smokeless tobacco: Never  Vaping Use   Vaping Use: Every day  Substance Use Topics   Alcohol use: No    Comment: in rehab   Drug use: Not Currently    Types: Methamphetamines    Comment: was in rehab and relapsed after 22 months. 02/19/20    Home Medications Prior to Admission medications   Medication Sig Start Date End Date Taking? Authorizing Provider  atorvastatin (LIPITOR) 20 MG tablet Take 1 tablet (20 mg total) by mouth daily. 11/09/20  Yes Argentina Donovan, PA-C  Dulaglutide 1.5 MG/0.5ML SOPN INJECT 1.5 MG INTO THE SKIN ONCE A WEEK. 11/09/20 11/09/21 Yes McClung, Dionne Bucy, PA-C  hydrOXYzine (VISTARIL) 25 MG capsule Take 1 capsule twice daily as needed for anxiety 04/10/21  Yes   insulin glargine (LANTUS) 100 UNIT/ML Solostar Pen Inject 40 Units into the skin daily. 11/09/20 11/09/21 Yes McClung, Dionne Bucy, PA-C  metFORMIN (GLUCOPHAGE-XR) 500 MG 24 hr tablet TAKE 2 TABLETS (1,000 MG TOTAL) BY MOUTH 2 (TWO) TIMES DAILY. 11/09/20 11/09/21 Yes McClung, Dionne Bucy, PA-C  sertraline (ZOLOFT) 50 MG tablet take 1 tablet by mouth daily 04/10/21  Yes   Blood Glucose Monitoring Suppl (TRUE METRIX METER) w/Device KIT Use as instructed. Check blood  glucose level by fingerstick 3-5 times per day. 06/29/20   Gildardo Pounds, NP  glucose blood test strip USE AS INSTRUCTED. CHECK BLOOD GLUCOSE LEVEL BY FINGERSTICK 3-5 TIMES PER DAY. 06/29/20 06/29/21  Gildardo Pounds, NP  glucose monitoring kit (FREESTYLE) monitoring kit 1 each by Does not apply route 4 (four) times daily - after meals and at bedtime. 1 month Diabetic Testing Supplies for QAC-QHS accuchecks. 02/23/20   Ghimire, Henreitta Leber, MD  Insulin Pen Needle (PEN NEEDLES) 32G X 4 MM MISC Use as instructed. Inject into the skin 5 times per day 06/29/20   Gildardo Pounds, NP  Insulin Pen Needle 32G X 4 MM MISC USE AS INSTRUCTED. INJECT INTO THE SKIN 5  TIMES PER DAY 11/09/20 11/09/21  Argentina Donovan, PA-C  TRUEplus Lancets 28G MISC USE AS INSTRUCTED. CHECK BLOOD GLUCOSE LEVEL BY FINGERSTICK 3-5 TIMES PER DAY. 06/29/20 06/29/21  Gildardo Pounds, NP    Allergies    Codeine  Review of Systems   Review of Systems  Constitutional:  Negative for activity change, appetite change, chills and fever.  HENT:  Negative for ear pain and sore throat.   Eyes:  Negative for pain and visual disturbance.  Respiratory:  Negative for cough, chest tightness and shortness of breath.   Cardiovascular:  Negative for chest pain and palpitations.  Gastrointestinal:  Negative for abdominal pain, diarrhea, nausea and vomiting.  Genitourinary:  Negative for dysuria, flank pain and hematuria.  Musculoskeletal:  Negative for arthralgias, back pain, myalgias and neck pain.  Skin:  Negative for color change and rash.  Neurological:  Positive for dizziness, weakness (Generalized) and light-headedness. Negative for seizures, syncope, facial asymmetry, speech difficulty, numbness and headaches.  Hematological:  Does not bruise/bleed easily.  Psychiatric/Behavioral:  Negative for confusion and decreased concentration.   All other systems reviewed and are negative.  Physical Exam Updated Vital Signs BP (!) 148/87 (BP Location: Left Arm)   Pulse 79   Temp 98.1 F (36.7 C) (Axillary)   Resp 20   Ht 6' (1.829 m)   Wt 131.5 kg   SpO2 97%   BMI 39.33 kg/m   Physical Exam Vitals and nursing note reviewed.  Constitutional:      General: He is not in acute distress.    Appearance: Normal appearance. He is well-developed. He is not ill-appearing, toxic-appearing or diaphoretic.  HENT:     Head: Normocephalic and atraumatic.     Right Ear: External ear normal.     Left Ear: External ear normal.     Nose: Nose normal.     Mouth/Throat:     Mouth: Mucous membranes are moist.     Pharynx: Oropharynx is clear.  Eyes:     General: No visual field deficit.     Extraocular Movements: Extraocular movements intact.     Conjunctiva/sclera: Conjunctivae normal.  Cardiovascular:     Rate and Rhythm: Normal rate and regular rhythm.     Heart sounds: No murmur heard. Pulmonary:     Effort: Pulmonary effort is normal. No respiratory distress.     Breath sounds: Normal breath sounds. No wheezing or rales.  Abdominal:     Palpations: Abdomen is soft.     Tenderness: There is no abdominal tenderness.  Musculoskeletal:        General: Normal range of motion.     Cervical back: Normal range of motion and neck supple. No rigidity.     Right lower leg: No edema.  Left lower leg: No edema.  Skin:    General: Skin is warm and dry.     Coloration: Skin is not jaundiced or pale.  Neurological:     Mental Status: He is alert and oriented to person, place, and time.     Cranial Nerves: Cranial nerves are intact. No cranial nerve deficit, dysarthria or facial asymmetry.     Sensory: Sensation is intact. No sensory deficit.     Motor: Pronator drift (Left side) present. No abnormal muscle tone.     Coordination: Coordination is intact. Finger-Nose-Finger Test normal.  Psychiatric:        Mood and Affect: Mood normal.        Behavior: Behavior normal.        Thought Content: Thought content normal.        Judgment: Judgment normal.    ED Results / Procedures / Treatments   Labs (all labs ordered are listed, but only abnormal results are displayed) Labs Reviewed  URINALYSIS, ROUTINE W REFLEX MICROSCOPIC - Abnormal; Notable for the following components:      Result Value   Glucose, UA 150 (*)    Hgb urine dipstick MODERATE (*)    Bacteria, UA RARE (*)    All other components within normal limits  HEPATIC FUNCTION PANEL - Abnormal; Notable for the following components:   Total Bilirubin 1.3 (*)    Bilirubin, Direct 0.4 (*)    All other components within normal limits  BASIC METABOLIC PANEL - Abnormal; Notable for the following components:   Sodium  132 (*)    Glucose, Bld 184 (*)    All other components within normal limits  DIFFERENTIAL - Abnormal; Notable for the following components:   Abs Immature Granulocytes 0.11 (*)    All other components within normal limits  CBC - Abnormal; Notable for the following components:   MCHC 36.8 (*)    All other components within normal limits  BASIC METABOLIC PANEL - Abnormal; Notable for the following components:   Glucose, Bld 228 (*)    All other components within normal limits  HEMOGLOBIN A1C - Abnormal; Notable for the following components:   Hgb A1c MFr Bld 7.9 (*)    All other components within normal limits  GLUCOSE, CAPILLARY - Abnormal; Notable for the following components:   Glucose-Capillary 282 (*)    All other components within normal limits  LIPID PANEL - Abnormal; Notable for the following components:   Cholesterol 289 (*)    Triglycerides 891 (*)    HDL 38 (*)    All other components within normal limits  GLUCOSE, CAPILLARY - Abnormal; Notable for the following components:   Glucose-Capillary 219 (*)    All other components within normal limits  LDL CHOLESTEROL, DIRECT - Abnormal; Notable for the following components:   Direct LDL 136.3 (*)    All other components within normal limits  GLUCOSE, CAPILLARY - Abnormal; Notable for the following components:   Glucose-Capillary 194 (*)    All other components within normal limits  CBG MONITORING, ED - Abnormal; Notable for the following components:   Glucose-Capillary 224 (*)    All other components within normal limits  CBG MONITORING, ED - Abnormal; Notable for the following components:   Glucose-Capillary 147 (*)    All other components within normal limits  RESP PANEL BY RT-PCR (FLU A&B, COVID) ARPGX2  CBC  PROTIME-INR  APTT  RAPID URINE DRUG SCREEN, HOSP PERFORMED  HIV ANTIBODY (ROUTINE TESTING W REFLEX)  TROPONIN I (HIGH SENSITIVITY)  TROPONIN I (HIGH SENSITIVITY)    EKG EKG  Interpretation  Date/Time:  Thursday April 13 2021 14:07:20 EDT Ventricular Rate:  93 PR Interval:  160 QRS Duration: 92 QT Interval:  373 QTC Calculation: 464 R Axis:   6 Text Interpretation: Sinus rhythm Probable left atrial enlargement Low voltage, precordial leads Confirmed by Godfrey Pick [phone]) on 04/13/2021 3:19:15 PM  Radiology CT HEAD WO CONTRAST (5MM)  Result Date: 04/13/2021 CLINICAL DATA:  Neuro deficit, acute, stroke suspected. EXAM: CT HEAD WITHOUT CONTRAST TECHNIQUE: Contiguous axial images were obtained from the base of the skull through the vertex without intravenous contrast. COMPARISON:  Report from head CT 07/13/2018 (images currently unavailable). FINDINGS: Brain: Cerebral volume is normal. Small well circumscribed hypodensity within the inferior left basal ganglia, likely reflecting a prominent perivascular space. There is no acute intracranial hemorrhage. No demarcated cortical infarct. No extra-axial fluid collection. No evidence of an intracranial mass. No midline shift. Vascular: No hyperdense vessel. Skull: Normal. Negative for fracture or focal lesion. Sinuses/Orbits: Visualized orbits show no acute finding. Mild partial opacification of the left ethmoid air cells. Background trace scattered mucosal thickening elsewhere within the bilateral ethmoid sinuses. IMPRESSION: No evidence of acute intracranial abnormality. Paranasal sinus disease, as described. Electronically Signed   By: Kellie Simmering D.O.   On: 04/13/2021 15:30   MR ANGIO HEAD WO CONTRAST  Result Date: 04/13/2021 CLINICAL DATA:  Neurological deficit, acute, stroke suspected. EXAM: MRI HEAD WITHOUT CONTRAST MRA HEAD WITHOUT CONTRAST MRA NECK WITHOUT CONTRAST TECHNIQUE: Multiplanar, multiecho pulse sequences of the brain and surrounding structures were obtained without intravenous contrast. Angiographic images of the Circle of Willis were obtained using MRA technique without intravenous contrast. Angiographic  images of the neck were obtained using MRA technique without intravenous contrast. Carotid stenosis measurements (when applicable) are obtained utilizing NASCET criteria, using the distal internal carotid diameter as the denominator. COMPARISON:  Head CT same day FINDINGS: MRI HEAD FINDINGS Brain: Mild chronic small-vessel ischemic change affects the pons. No focal cerebellar finding. Cerebral hemispheres appear normal without evidence of old or acute infarction. No mass lesion, hemorrhage, hydrocephalus or extra-axial collection. Vascular: Major vessels at the base of the brain show flow. Skull and upper cervical spine: Negative Sinuses/Orbits: Clear/normal Other: None MRA HEAD FINDINGS Both internal carotid arteries are widely patent into the brain. No siphon stenosis. The anterior and middle cerebral vessels are patent without proximal stenosis, aneurysm or vascular malformation. Both vertebral arteries are widely patent to the basilar. No basilar stenosis. Posterior circulation branch vessels appear normal. Right PCA takes fetal origin from the anterior circulation. MRA NECK FINDINGS Neck vessels appear normal. No evidence of atherosclerotic disease or stenosis. No evidence of dissection. Antegrade flow in all major vessels. IMPRESSION: MRI head: No acute finding. Minimal/early chronic appearing small-vessel change of the pons. Otherwise normal. MRA of the neck: Normal noncontrast examination. MRA of the head: Normal. Electronically Signed   By: Nelson Chimes M.D.   On: 04/13/2021 17:40   MR MRA NECK WO CONTRAST  Result Date: 04/13/2021 CLINICAL DATA:  Neurological deficit, acute, stroke suspected. EXAM: MRI HEAD WITHOUT CONTRAST MRA HEAD WITHOUT CONTRAST MRA NECK WITHOUT CONTRAST TECHNIQUE: Multiplanar, multiecho pulse sequences of the brain and surrounding structures were obtained without intravenous contrast. Angiographic images of the Circle of Willis were obtained using MRA technique without intravenous  contrast. Angiographic images of the neck were obtained using MRA technique without intravenous contrast. Carotid stenosis measurements (when applicable) are obtained utilizing NASCET  criteria, using the distal internal carotid diameter as the denominator. COMPARISON:  Head CT same day FINDINGS: MRI HEAD FINDINGS Brain: Mild chronic small-vessel ischemic change affects the pons. No focal cerebellar finding. Cerebral hemispheres appear normal without evidence of old or acute infarction. No mass lesion, hemorrhage, hydrocephalus or extra-axial collection. Vascular: Major vessels at the base of the brain show flow. Skull and upper cervical spine: Negative Sinuses/Orbits: Clear/normal Other: None MRA HEAD FINDINGS Both internal carotid arteries are widely patent into the brain. No siphon stenosis. The anterior and middle cerebral vessels are patent without proximal stenosis, aneurysm or vascular malformation. Both vertebral arteries are widely patent to the basilar. No basilar stenosis. Posterior circulation branch vessels appear normal. Right PCA takes fetal origin from the anterior circulation. MRA NECK FINDINGS Neck vessels appear normal. No evidence of atherosclerotic disease or stenosis. No evidence of dissection. Antegrade flow in all major vessels. IMPRESSION: MRI head: No acute finding. Minimal/early chronic appearing small-vessel change of the pons. Otherwise normal. MRA of the neck: Normal noncontrast examination. MRA of the head: Normal. Electronically Signed   By: Nelson Chimes M.D.   On: 04/13/2021 17:40   MR BRAIN WO CONTRAST  Result Date: 04/13/2021 CLINICAL DATA:  Neurological deficit, acute, stroke suspected. EXAM: MRI HEAD WITHOUT CONTRAST MRA HEAD WITHOUT CONTRAST MRA NECK WITHOUT CONTRAST TECHNIQUE: Multiplanar, multiecho pulse sequences of the brain and surrounding structures were obtained without intravenous contrast. Angiographic images of the Circle of Willis were obtained using MRA technique  without intravenous contrast. Angiographic images of the neck were obtained using MRA technique without intravenous contrast. Carotid stenosis measurements (when applicable) are obtained utilizing NASCET criteria, using the distal internal carotid diameter as the denominator. COMPARISON:  Head CT same day FINDINGS: MRI HEAD FINDINGS Brain: Mild chronic small-vessel ischemic change affects the pons. No focal cerebellar finding. Cerebral hemispheres appear normal without evidence of old or acute infarction. No mass lesion, hemorrhage, hydrocephalus or extra-axial collection. Vascular: Major vessels at the base of the brain show flow. Skull and upper cervical spine: Negative Sinuses/Orbits: Clear/normal Other: None MRA HEAD FINDINGS Both internal carotid arteries are widely patent into the brain. No siphon stenosis. The anterior and middle cerebral vessels are patent without proximal stenosis, aneurysm or vascular malformation. Both vertebral arteries are widely patent to the basilar. No basilar stenosis. Posterior circulation branch vessels appear normal. Right PCA takes fetal origin from the anterior circulation. MRA NECK FINDINGS Neck vessels appear normal. No evidence of atherosclerotic disease or stenosis. No evidence of dissection. Antegrade flow in all major vessels. IMPRESSION: MRI head: No acute finding. Minimal/early chronic appearing small-vessel change of the pons. Otherwise normal. MRA of the neck: Normal noncontrast examination. MRA of the head: Normal. Electronically Signed   By: Nelson Chimes M.D.   On: 04/13/2021 17:40    Procedures Procedures   Medications Ordered in ED Medications  sertraline (ZOLOFT) tablet 50 mg (50 mg Oral Given 04/14/21 0941)  hydrOXYzine (ATARAX/VISTARIL) tablet 25 mg (has no administration in time range)  enoxaparin (LOVENOX) injection 60 mg (60 mg Subcutaneous Given 04/13/21 2129)  senna-docusate (Senokot-S) tablet 1 tablet (has no administration in time range)   acetaminophen (TYLENOL) tablet 650 mg (has no administration in time range)    Or  acetaminophen (TYLENOL) suppository 650 mg (has no administration in time range)  hydrALAZINE (APRESOLINE) injection 5 mg (has no administration in time range)  insulin aspart (novoLOG) injection 0-9 Units (3 Units Subcutaneous Given 04/14/21 0828)  insulin aspart (novoLOG) injection 0-5 Units (  0 Units Subcutaneous Not Given 04/13/21 2128)  insulin glargine-yfgn (SEMGLEE) injection 20 Units (has no administration in time range)  influenza vac split quadrivalent PF (FLUARIX) injection 0.5 mL (has no administration in time range)  losartan (COZAAR) tablet 25 mg (25 mg Oral Given 04/14/21 0946)  atorvastatin (LIPITOR) tablet 80 mg (has no administration in time range)  aspirin EC tablet 81 mg (81 mg Oral Given 04/14/21 1128)  clopidogrel (PLAVIX) tablet 300 mg (has no administration in time range)    Followed by  clopidogrel (PLAVIX) tablet 75 mg (has no administration in time range)  aspirin tablet 325 mg (325 mg Oral Given 04/13/21 1840)  insulin glargine (LANTUS) injection 20 Units (20 Units Subcutaneous Given 04/13/21 2139)    ED Course  I have reviewed the triage vital signs and the nursing notes.  Pertinent labs & imaging results that were available during my care of the patient were reviewed by me and considered in my medical decision making (see chart for details).    MDM Rules/Calculators/A&P                           Patient presents for transient episode of weakness.  Prior to being bedded in the ED, work-up was initiated.  Blood glucose was normal.  Vital signs on arrival notable for hypertension only.  On exam, patient is alert and oriented.  On neurologic exam, he has a slight pronator drift in his left hemibody.  Code stroke was called.  Noncontrasted CT scan showed no LVO.  Per neurology recommendations, MRI/MRA studies were ordered.  ASA was ordered.  Patient was admitted to hospitalist  service.  Final Clinical Impression(s) / ED Diagnoses Final diagnoses:  Weakness    Rx / DC Orders ED Discharge Orders     None        Godfrey Pick, MD 04/14/21 1320

## 2021-04-13 NOTE — H&P (Addendum)
History and Physical    Jason Wolf CBJ:[id number] DOB: 1981-03-29 DOA: 04/13/2021  PCP: Gildardo Pounds, NP   Chief Complaint: Unilateral weakness/dizziness  HPI: Jason Wolf is a 40 y.o. male with medical history significant of bipolar disorder, hypertension, hyperlipidemia, polysubstance abuse, alcohol abuse, IV drug abuse, non-insulin-dependent diabetes mellitus type 2.  Patient presents with transient episodes of dizziness weakness presyncope and questionable unilateral weakness.  In the ED patient was evaluated by neuro with NIH stroke scale of 2 given left lower extremity weakness and dysarthria, tPA withheld due to low score, MRI currently pending.  Patient being admitted to [facility] for further evaluation and treatment with neurology team with medicine admitting as primary.  Review of Systems: As per HPI denies nausea vomiting diarrhea constipation headache fevers chills or chest pain.   Assessment/Plan   Hypertensive emergency, rule out CVA versus TIA - Neurology following, appreciate insight and recommendations - CT head without overt findings, MRI pending - Additional work-up including but not limited to CTA/carotid Doppler/echo per neurology - Currently on atorvastatin 20 at home, likely to increase dose pending imaging -Glucose poorly controlled given previous A1c of 8.1 -Continue permissive hypertension for the next 24 hours, home med reconciliation lacking any antihypertensives -continue hydralazine overnight if blood pressure systolic over 160  Hypertension, essential, uncontrolled  -Patient does not currently appear to be on any antihypertensive medications, would consider initiating new medication pending imaging as above and follow-up blood pressure once out of permissive hypertension window - Patient previously on Lisinopril until it was stopped (unclear time frame) - he reports no issues with hypotension or reaction (specifically denies angioedema/swelling).  Unclear etiology for cessation.  Hyperlipidemia  -Atorvastatin 20 to be continued, will likely increase dose or switch to Crestor pending MRI findings and neuro recommendations  Polysubstance abuse/alcohol use and abuse/IV drug abuse - Most notably methamphetamines in the past - UDS negative at intake; follow closely for withdrawal symptoms - Continue home Atarax  Non insulin-dependent diabetes type 2, uncontrolled with hyperglycemia - Continue sliding scale insulin, hypoglycemic protocol - Resume home long-acting insulin at half dose 20 units, if tolerating p.o. well will increase back to home dose 40 units tomorrow -Follow repeat A1c, last known value 8.29 June 2020 Lab Results  Component Value Date   HGBA1C 8.1 (A) 06/29/2020   Bipolar disorder  -Continue sertraline 50 mg daily  DVT prophylaxis: Lovenox Code Status: Full Family Communication: None Status is: Inpatient  Dispo: The patient is from: Home              Anticipated d/c is to: Home              Anticipated d/c date is: 24 to 48 hours pending clinical course              Patient currently not medically stable for discharge given ongoing need for further evaluation and imaging  Consultants:  Neurology  Procedures:  None planned   Past Medical History:  Diagnosis Date   Bipolar disorder ([place])    Diabetes mellitus without complication ([place])    ETOH abuse    Hypertension    Opiate abuse, episodic ([place])     Past Surgical History:  Procedure Laterality Date   I & D EXTREMITY Right 02/20/2020   Procedure: IRRIGATION AND DEBRIDEMENT HAND;  Surgeon: Verner Mould, MD;  Location: [place];  Service: Orthopedics;  Laterality: Right;   KNEE ARTHROSCOPY       reports that he has  been smoking. He has been smoking an average of 1 pack per day. He has never used smokeless tobacco. He reports that he does not currently use drugs after having used the following drugs: Methamphetamines. He reports that he does  not drink alcohol.  Allergies  Allergen Reactions   Codeine Nausea And Vomiting    Family History  Problem Relation Age of Onset   Diabetes type II Mother    Diabetes Mother     Prior to Admission medications   Medication Sig Start Date End Date Taking? Authorizing Provider  atorvastatin (LIPITOR) 20 MG tablet Take 1 tablet (20 mg total) by mouth daily. 11/09/20  Yes Argentina Donovan, PA-C  Dulaglutide 1.5 MG/0.5ML SOPN INJECT 1.5 MG INTO THE SKIN ONCE A WEEK. 11/09/20 11/09/21 Yes McClung, Dionne Bucy, PA-C  hydrOXYzine (VISTARIL) 25 MG capsule Take 1 capsule twice daily as needed for anxiety 04/10/21  Yes   insulin glargine (LANTUS) 100 UNIT/ML Solostar Pen Inject 40 Units into the skin daily. 11/09/20 11/09/21 Yes McClung, Dionne Bucy, PA-C  metFORMIN (GLUCOPHAGE-XR) 500 MG 24 hr tablet TAKE 2 TABLETS (1,000 MG TOTAL) BY MOUTH 2 (TWO) TIMES DAILY. 11/09/20 11/09/21 Yes McClung, Dionne Bucy, PA-C  sertraline (ZOLOFT) 50 MG tablet take 1 tablet by mouth daily 04/10/21  Yes   Blood Glucose Monitoring Suppl (TRUE METRIX METER) w/Device KIT Use as instructed. Check blood glucose level by fingerstick 3-5 times per day. 06/29/20   Gildardo Pounds, NP  glucose blood test strip USE AS INSTRUCTED. CHECK BLOOD GLUCOSE LEVEL BY FINGERSTICK 3-5 TIMES PER DAY. 06/29/20 06/29/21  Gildardo Pounds, NP  glucose monitoring kit (FREESTYLE) monitoring kit 1 each by Does not apply route 4 (four) times daily - after meals and at bedtime. 1 month Diabetic Testing Supplies for QAC-QHS accuchecks. 02/23/20   Ghimire, Henreitta Leber, MD  hydrOXYzine (VISTARIL) 25 MG capsule TAKE 1 CAPSULE (25 MG TOTAL) BY MOUTH EVERY 6 (SIX) HOURS AS NEEDED FOR ANXIETY. Patient not taking: No sig reported 01/05/21 01/05/22  Gildardo Pounds, NP  Insulin Pen Needle (PEN NEEDLES) 32G X 4 MM MISC Use as instructed. Inject into the skin 5 times per day 06/29/20   Gildardo Pounds, NP  Insulin Pen Needle 32G X 4 MM MISC USE AS INSTRUCTED. INJECT INTO THE SKIN 5  TIMES PER DAY 11/09/20 11/09/21  Argentina Donovan, PA-C  TRUEplus Lancets 28G MISC USE AS INSTRUCTED. CHECK BLOOD GLUCOSE LEVEL BY FINGERSTICK 3-5 TIMES PER DAY. 06/29/20 06/29/21  Gildardo Pounds, NP    Physical Exam: Vitals:   04/13/21 1404 04/13/21 1521  BP: (!) 156/99 (!) 172/111  Pulse: 95 99  Resp: 15 18  Temp: 98.6 F (37 C)   TempSrc: Oral   SpO2: 96% 97%    Constitutional: NAD, calm, comfortable Vitals:   04/13/21 1404 04/13/21 1521  BP: (!) 156/99 (!) 172/111  Pulse: 95 99  Resp: 15 18  Temp: 98.6 F (37 C)   TempSrc: Oral   SpO2: 96% 97%   General:  Pleasantly resting in bed, No acute distress. HEENT:  Normocephalic atraumatic.  Sclerae nonicteric, noninjected.  Extraocular movements intact bilaterally. Neck:  Without mass or deformity.  Trachea is midline. Lungs:  Clear to auscultate bilaterally without rhonchi, wheeze, or rales. Heart:  Regular rate and rhythm.  Without murmurs, rubs, or gallops. Abdomen:  Soft, nontender, nondistended.  Without guarding or rebound. Extremities: Without cyanosis, clubbing, edema, or obvious deformity. Vascular:  Dorsalis pedis and posterior tibial  pulses palpable bilaterally. Skin:  Warm and dry, no erythema, no ulcerations.  Labs on Admission: I have personally reviewed following labs and imaging studies  CBC: Recent Labs  Lab 04/13/21 1420  WBC 8.9  HGB 16.6  HCT 50.6  MCV 92.0  PLT 786   Basic Metabolic Panel: Recent Labs  Lab 04/13/21 1504  NA 132*  K 4.5  CL 98  CO2 23  GLUCOSE 184*  BUN 10  CREATININE 0.89  CALCIUM 9.5   GFR: Estimated Creatinine Clearance: 150.4 mL/min (by C-G formula based on SCr of 0.89 mg/dL). Liver Function Tests: Recent Labs  Lab 04/13/21 1430  AST 38  ALT 30  ALKPHOS 65  BILITOT 1.3*  PROT 7.8  ALBUMIN 4.2   No results for input(s): LIPASE, AMYLASE in the last 168 hours. No results for input(s): AMMONIA in the last 168 hours. Coagulation Profile: Recent Labs  Lab  04/13/21 1521  INR 0.9   Cardiac Enzymes: No results for input(s): CKTOTAL, CKMB, CKMBINDEX, TROPONINI in the last 168 hours. BNP (last 3 results) No results for input(s): PROBNP in the last 8760 hours. HbA1C: No results for input(s): HGBA1C in the last 72 hours. CBG: Recent Labs  Lab 04/13/21 1530  GLUCAP 224*   Lipid Profile: No results for input(s): CHOL, HDL, LDLCALC, TRIG, CHOLHDL, LDLDIRECT in the last 72 hours. Thyroid Function Tests: No results for input(s): TSH, T4TOTAL, FREET4, T3FREE, THYROIDAB in the last 72 hours. Anemia Panel: No results for input(s): VITAMINB12, FOLATE, FERRITIN, TIBC, IRON, RETICCTPCT in the last 72 hours. Urine analysis:    Component Value Date/Time   COLORURINE YELLOW 04/13/2021 1425   APPEARANCEUR CLEAR 04/13/2021 1425   APPEARANCEUR Clear 11/10/2014 1132   LABSPEC 1.012 04/13/2021 1425   LABSPEC 1.011 11/10/2014 1132   PHURINE 7.0 04/13/2021 1425   GLUCOSEU 150 (A) 04/13/2021 1425   GLUCOSEU Negative 11/10/2014 1132   HGBUR MODERATE (A) 04/13/2021 1425   BILIRUBINUR NEGATIVE 04/13/2021 1425   BILIRUBINUR Negative 11/10/2014 1132   KETONESUR NEGATIVE 04/13/2021 1425   PROTEINUR NEGATIVE 04/13/2021 1425   NITRITE NEGATIVE 04/13/2021 1425   LEUKOCYTESUR NEGATIVE 04/13/2021 1425   LEUKOCYTESUR Negative 11/10/2014 1132    Radiological Exams on Admission: CT HEAD WO CONTRAST (5MM)  Result Date: 04/13/2021 CLINICAL DATA:  Neuro deficit, acute, stroke suspected. EXAM: CT HEAD WITHOUT CONTRAST TECHNIQUE: Contiguous axial images were obtained from the base of the skull through the vertex without intravenous contrast. COMPARISON:  Report from head CT 07/13/2018 (images currently unavailable). FINDINGS: Brain: Cerebral volume is normal. Small well circumscribed hypodensity within the inferior left basal ganglia, likely reflecting a prominent perivascular space. There is no acute intracranial hemorrhage. No demarcated cortical infarct. No  extra-axial fluid collection. No evidence of an intracranial mass. No midline shift. Vascular: No hyperdense vessel. Skull: Normal. Negative for fracture or focal lesion. Sinuses/Orbits: Visualized orbits show no acute finding. Mild partial opacification of the left ethmoid air cells. Background trace scattered mucosal thickening elsewhere within the bilateral ethmoid sinuses. IMPRESSION: No evidence of acute intracranial abnormality. Paranasal sinus disease, as described. Electronically Signed   By: Kellie Simmering D.O.   On: 04/13/2021 15:30    EKG: Independently reviewed. NSR.   Little Ishikawa DO [facility] For contact please use secure messenger on Epic  If 7PM-7AM, please contact night-coverage located on [email or web address]   04/13/2021, 4:46 PM

## 2021-04-13 NOTE — Consult Note (Signed)
NEUROLOGY TELECONSULTATION NOTE   Date of service: April 13, 2021 Patient Name: Jason Wolf MRN:  [id number] DOB:  1981/01/03 Reason for consult: telestroke  Requesting Provider: Dr. Godfrey Pick Consult Participants: myself, patient, bedside RN, telestroke RN Location of the provider: [facility] Location of the patient: [facility]  This consult was provided via telemedicine with 2-way video and audio communication. The patient/family was informed that care would be provided in this way and agreed to receive care in this manner.   _ _ _   _ __   _ __ _ _  __ __   _ __   __ _  History of Present Illness   This is a 40 yo man with hx significant bipolar disorder, DM2, HTN, HL, polysubstance abuse incl EtOH, opiates, IVDU who presented to [facility] after presyncope and acute exacerbation of chronic dysarthria (patient is edentulous) as well as subtle L sided weakness. LKW 1230. Experienced dizziness but not syncope at the time. CT head non con NAICP. NIHSS = 2 for dysarthria and LLE weakness. TNK not administered 2/2 low NIHSS and resolving sx. CTA not performed 2/2 exam not c/w LVO.   ROS   Per HPI; all other systems reviewed and were negative  Past History   Past Medical History:  Diagnosis Date   Bipolar disorder ([place])    Diabetes mellitus without complication ([place])    ETOH abuse    Hypertension    Opiate abuse, episodic ([place])    Past Surgical History:  Procedure Laterality Date   I & D EXTREMITY Right 02/20/2020   Procedure: IRRIGATION AND DEBRIDEMENT HAND;  Surgeon: Verner Mould, MD;  Location: [place];  Service: Orthopedics;  Laterality: Right;   KNEE ARTHROSCOPY     Family History  Problem Relation Age of Onset   Diabetes type II Mother    Diabetes Mother    Social History   Socioeconomic History   Marital status: Single    Spouse name: Not on file   Number of children: Not on file   Years of education: Not on file   Highest education level: Not on file   Occupational History   Not on file  Tobacco Use   Smoking status: Every Day    Packs/day: 1.00    Types: Cigarettes   Smokeless tobacco: Never  Vaping Use   Vaping Use: Every day  Substance and Sexual Activity   Alcohol use: No    Comment: in rehab   Drug use: Not Currently    Types: Methamphetamines    Comment: was in rehab and relapsed after 22 months. 02/19/20   Sexual activity: Not Currently  Other Topics Concern   Not on file  Social History Narrative   Not on file   Social Determinants of Health   Financial Resource Strain: Not on file  Food Insecurity: Not on file  Transportation Needs: Not on file  Physical Activity: Not on file  Stress: Not on file  Social Connections: Not on file   Allergies  Allergen Reactions   Codeine Nausea And Vomiting    Medications   No current facility-administered medications for this encounter.  Current Outpatient Medications:    atorvastatin (LIPITOR) 20 MG tablet, Take 1 tablet (20 mg total) by mouth daily., Disp: 90 tablet, Rfl: 3   Blood Glucose Monitoring Suppl (TRUE METRIX METER) w/Device KIT, Use as instructed. Check blood glucose level by fingerstick 3-5 times per day., Disp: 1 kit, Rfl: 0   Dulaglutide 1.5  MG/0.5ML SOPN, INJECT 1.5 MG INTO THE SKIN ONCE A WEEK., Disp: 6 mL, Rfl: 1   glucose blood test strip, USE AS INSTRUCTED. CHECK BLOOD GLUCOSE LEVEL BY FINGERSTICK 3-5 TIMES PER DAY., Disp: 100 strip, Rfl: 12   glucose monitoring kit (FREESTYLE) monitoring kit, 1 each by Does not apply route 4 (four) times daily - after meals and at bedtime. 1 month Diabetic Testing Supplies for QAC-QHS accuchecks., Disp: 1 each, Rfl: 1   hydrOXYzine (VISTARIL) 25 MG capsule, TAKE 1 CAPSULE (25 MG TOTAL) BY MOUTH EVERY 6 (SIX) HOURS AS NEEDED FOR ANXIETY., Disp: 60 capsule, Rfl: 1   hydrOXYzine (VISTARIL) 25 MG capsule, Take 1 capsule twice daily as needed for anxiety, Disp: 60 capsule, Rfl: 1   insulin glargine (LANTUS) 100 UNIT/ML  Solostar Pen, Inject 40 Units into the skin daily., Disp: 12 mL, Rfl: 3   Insulin Pen Needle (PEN NEEDLES) 32G X 4 MM MISC, Use as instructed. Inject into the skin 5 times per day, Disp: 200 each, Rfl: 6   Insulin Pen Needle 32G X 4 MM MISC, USE AS INSTRUCTED. INJECT INTO THE SKIN 5 TIMES PER DAY, Disp: 200 each, Rfl: 6   metFORMIN (GLUCOPHAGE-XR) 500 MG 24 hr tablet, TAKE 2 TABLETS (1,000 MG TOTAL) BY MOUTH 2 (TWO) TIMES DAILY., Disp: 120 tablet, Rfl: 2   sertraline (ZOLOFT) 50 MG tablet, take 1 tablet by mouth daily, Disp: 30 tablet, Rfl: 2   TRUEplus Lancets 28G MISC, USE AS INSTRUCTED. CHECK BLOOD GLUCOSE LEVEL BY FINGERSTICK 3-5 TIMES PER DAY., Disp: 200 each, Rfl: 3     Vitals   Vitals:   04/13/21 1404 04/13/21 1521  BP: (!) 156/99 (!) 172/111  Pulse: 95 99  Resp: 15 18  Temp: 98.6 F (37 C)   TempSrc: Oral   SpO2: 96% 97%     There is no height or weight on file to calculate BMI.  Physical Exam   Exam performed over telemedicine with 2-way video and audio communication and with assistance of bedside RN  Physical Exam Gen: A&O x4, NAD Resp: normal WOB CV: extremities appear well-perfused  Neuro: *MS: A&O x4. Follows multi-step commands.  *Speech: mild dysarthria, patient is edentulous but stated speech is more slurred than normal without his teeth, no aphasia, able to name and repeat *CN: PERRL 58m, EOMI, VFF by confrontation, sensation intact, smile symmetric, hearing intact to voice *Motor:   Normal bulk.  No tremor, rigidity or bradykinesia. LLE drift but not to bed, all other extremities appear to be full strength. *Sensory: SILT. Symmetric. No double-simultaneous extinction.  *Coordination:  Finger-to-nose, heel-to-shin, rapid alternating motions were intact. *Reflexes:  UTA 2/2 tele-exam *Gait: deferred  NIHSS = 2 for dysarthria and LLE drift   Premorbid mRS = 0   Labs   CBC:  Recent Labs  Lab 04/13/21 1420  WBC 8.9  HGB 16.6  HCT 50.6  MCV 92.0   PLT 2614   Basic Metabolic Panel:  Lab Results  Component Value Date   NA 132 (L) 04/13/2021   K 4.5 04/13/2021   CO2 23 04/13/2021   GLUCOSE 184 (H) 04/13/2021   BUN 10 04/13/2021   CREATININE 0.89 04/13/2021   CALCIUM 9.5 04/13/2021   GFRNONAA >60 04/13/2021   GFRAA 137 06/29/2020   Lipid Panel:  Lab Results  Component Value Date   LDLCALC 135 (H) 06/29/2020   HgbA1c:  Lab Results  Component Value Date   HGBA1C 8.1 (A) 06/29/2020   Urine  Drug Screen:     Component Value Date/Time   LABOPIA NONE DETECTED 02/05/2016 0810   COCAINSCRNUR NONE DETECTED 02/05/2016 0810   LABBENZ NONE DETECTED 02/05/2016 0810   AMPHETMU NONE DETECTED 02/05/2016 0810   THCU NONE DETECTED 02/05/2016 0810   LABBARB NONE DETECTED 02/05/2016 0810    Alcohol Level     Component Value Date/Time   ETH <5 02/05/2016 0720     Impression   This is a 40 yo man with hx significant bipolar disorder, DM2, HTN, HL, polysubstance abuse incl EtOH, opiates, IVDU who presented to [facility] after presyncope and acute exacerbation of chronic dysarthria (patient is edentulous) as well as subtle L sided weakness. TNK not administered 2/2 low stroke scale and rapidly resolving sx. Given his multiple cerebrovascular risk factors and hx IVDU I recommend admission to hospitalist service at [facility] for TIA/stroke w/u. D/w Dr. Doren Custard by phone who is in agreement.  Recommendations   - Admit to hospitalist service at [facility] for stroke w/u; page neurology on arrival. Stroke team will follow.  - Permissive HTN x48 hrs from sx onset or until stroke ruled out by MRI goal BP <220/110. PRN labetalol or hydralazine if BP above these parameters. Avoid oral antihypertensives. - MRI brain wo contrast - CTA H&N or MRA H&N - TTE w/ bubble - Check A1c and LDL + add statin per guidelines - ASA 310m daily f/b ASA 837mdaily - q4 hr neuro checks - STAT head CT for any change in neuro exam - Tele - PT/OT/SLP - Stroke education - Amb  referral to neurology upon discharge  ______________________________________________________________________   Thank you for the opportunity to take part in the care of this patient. If you have any further questions, please contact the neurology consultation attending.  Signed,  CoSu MonksMD [facility] [phone] 7pm- 7am, please page neurology on call as listed in [place]

## 2021-04-14 ENCOUNTER — Inpatient Hospital Stay (HOSPITAL_COMMUNITY): Payer: Self-pay

## 2021-04-14 ENCOUNTER — Encounter (HOSPITAL_COMMUNITY): Payer: Self-pay | Admitting: Internal Medicine

## 2021-04-14 ENCOUNTER — Other Ambulatory Visit: Payer: Self-pay

## 2021-04-14 DIAGNOSIS — G459 Transient cerebral ischemic attack, unspecified: Principal | ICD-10-CM

## 2021-04-14 LAB — BASIC METABOLIC PANEL
Anion gap: 7 (ref 5–15)
BUN: 10 mg/dL (ref 6–20)
CO2: 25 mmol/L (ref 22–32)
Calcium: 9.1 mg/dL (ref 8.9–10.3)
Chloride: 103 mmol/L (ref 98–111)
Creatinine, Ser: 0.72 mg/dL (ref 0.61–1.24)
GFR, Estimated: >60 mL/min (ref >60)
Glucose, Bld: 228 mg/dL — ABNORMAL HIGH (ref 70–99)
Potassium: 4.1 mmol/L (ref 3.5–5.1)
Sodium: 135 mmol/L (ref 135–145)

## 2021-04-14 LAB — HEMOGLOBIN A1C
Hgb A1c MFr Bld: 7.9 % — ABNORMAL HIGH (ref 4.8–5.6)
Mean Plasma Glucose: 180.03 mg/dL

## 2021-04-14 LAB — LDL CHOLESTEROL, DIRECT: Direct LDL: 136.3 mg/dL — ABNORMAL HIGH (ref 0–99)

## 2021-04-14 LAB — CBC
HCT: 39.7 % (ref 39.0–52.0)
Hemoglobin: 14.6 g/dL (ref 13.0–17.0)
MCH: 30.6 pg (ref 26.0–34.0)
MCHC: 36.8 g/dL — ABNORMAL HIGH (ref 30.0–36.0)
MCV: 83.2 fL (ref 80.0–100.0)
Platelets: 332 10*3/uL (ref 150–400)
RBC: 4.77 MIL/uL (ref 4.22–5.81)
RDW: 12.4 % (ref 11.5–15.5)
WBC: 9.2 10*3/uL (ref 4.0–10.5)
nRBC: 0 % (ref 0.0–0.2)

## 2021-04-14 LAB — GLUCOSE, CAPILLARY
Glucose-Capillary: 282 mg/dL — ABNORMAL HIGH (ref 70–99)
Glucose-Capillary: 219 mg/dL — ABNORMAL HIGH (ref 70–99)
Glucose-Capillary: 194 mg/dL — ABNORMAL HIGH (ref 70–99)
Glucose-Capillary: 189 mg/dL — ABNORMAL HIGH (ref 70–99)

## 2021-04-14 LAB — ECHOCARDIOGRAM COMPLETE BUBBLE STUDY
Area-P 1/2: 3.03 cm2
Calc EF: 67.8 %
S' Lateral: 3.1 cm
Single Plane A2C EF: 73.6 %
Single Plane A4C EF: 63 %

## 2021-04-14 LAB — LIPID PANEL
Cholesterol: 289 mg/dL — ABNORMAL HIGH (ref 0–200)
HDL: 38 mg/dL — ABNORMAL LOW (ref >40)
LDL Cholesterol: UNDETERMINED mg/dL (ref 0–99)
Total CHOL/HDL Ratio: 7.6 RATIO
Triglycerides: 891 mg/dL — ABNORMAL HIGH (ref <150)
VLDL: UNDETERMINED mg/dL (ref 0–40)

## 2021-04-14 LAB — HIV ANTIBODY (ROUTINE TESTING W REFLEX): HIV Screen 4th Generation wRfx: NONREACTIVE

## 2021-04-14 MED ORDER — ATORVASTATIN CALCIUM 40 MG PO TABS: 80.0000 mg | ORAL_TABLET | Freq: Every day | ORAL | Status: DC

## 2021-04-14 MED ORDER — INFLUENZA VAC SPLIT QUAD 0.5 ML IM SUSY: 0.5000 mL | PREFILLED_SYRINGE | INTRAMUSCULAR | Status: DC

## 2021-04-14 MED ORDER — LOSARTAN POTASSIUM 25 MG PO TABS
25.0000 mg | ORAL_TABLET | Freq: Every day | ORAL | Status: DC
  Administered 2021-04-14: 25 mg via ORAL
  Filled 2021-04-14: qty 1

## 2021-04-14 MED ORDER — CLOPIDOGREL BISULFATE 75 MG PO TABS: 75.0000 mg | ORAL_TABLET | Freq: Every day | ORAL | Status: DC

## 2021-04-14 MED ORDER — ASPIRIN EC 81 MG PO TBEC
81.0000 mg | DELAYED_RELEASE_TABLET | Freq: Every day | ORAL | Status: DC
  Administered 2021-04-14: 81 mg via ORAL
  Filled 2021-04-14: qty 1

## 2021-04-14 MED ORDER — CLOPIDOGREL BISULFATE 75 MG PO TABS
300.0000 mg | ORAL_TABLET | Freq: Once | ORAL | Status: AC
  Administered 2021-04-14: 300 mg via ORAL
  Filled 2021-04-14: qty 4

## 2021-04-14 MED ORDER — CLOPIDOGREL BISULFATE 75 MG PO TABS: 75.0000 mg | ORAL_TABLET | Freq: Every day | ORAL | 0 refills | Status: AC

## 2021-04-14 MED ORDER — ATORVASTATIN CALCIUM 80 MG PO TABS: 80.0000 mg | ORAL_TABLET | Freq: Every day | ORAL | 2 refills | Status: DC

## 2021-04-14 MED ORDER — LOSARTAN POTASSIUM 25 MG PO TABS: 25.0000 mg | ORAL_TABLET | Freq: Every day | ORAL | 2 refills | Status: DC

## 2021-04-14 MED ORDER — ASPIRIN 81 MG PO TBEC: 81.0000 mg | DELAYED_RELEASE_TABLET | Freq: Every day | ORAL | 0 refills | Status: DC

## 2021-04-14 NOTE — Consult Note (Signed)
Neurology Consultation Reason for Consult: Code stroke follow-up Requesting Physician: Cordelia Poche  CC: Left-sided weakness  History is obtained from: Patient and chart review  HPI: Jason Wolf is a 40 y.o. male with past medical history significant for polysubstance abuse (ethanol, opiates, methamphetamines including IV drug use), diabetes (poorly controlled, last prior A1c 8.1%), hypertension, hyperlipidemia, bipolar disorder, obesity (BMI 39.33).  He initially presented to [facility] long ED with acute on chronic dysarthria and subtle left-sided weakness, dizziness but no syncope.  Evaluated by teleneurology who determined he was not a thrombolytic candidate given low NIH stroke scale and resolving symptoms.  He reports his symptoms lasted less than 3 hours total and he is now feeling entirely back to baseline.  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   ROS: All other review of systems was negative except as noted in the HPI.   Past Medical History:  Diagnosis Date   Bipolar disorder ([place])    Diabetes mellitus without complication ([place])    ETOH abuse    Hypertension    Opiate abuse, episodic ([place])    Past Surgical History:  Procedure Laterality Date   I & D EXTREMITY Right 02/20/2020   Procedure: IRRIGATION AND DEBRIDEMENT HAND;  Surgeon: Verner Mould, MD;  Location: [place];  Service: Orthopedics;  Laterality: Right;   KNEE ARTHROSCOPY      Current Facility-Administered Medications:    acetaminophen (TYLENOL) tablet 650 mg, 650 mg, Oral, Q6H PRN **OR** acetaminophen (TYLENOL) suppository 650 mg, 650 mg, Rectal, Q6H PRN,  Little Ishikawa, MD   [START ON 04/15/2021] atorvastatin (LIPITOR) tablet 80 mg, 80 mg, Oral, Daily, Kirby-Graham, Karsten Fells, NP   enoxaparin (LOVENOX) injection 60 mg, 60 mg, Subcutaneous, Q24H, Little Ishikawa, MD, 60 mg at 04/13/21 2129   hydrALAZINE (APRESOLINE) injection 5 mg, 5 mg, Intravenous, Q6H PRN, Little Ishikawa, MD   hydrOXYzine (ATARAX/VISTARIL) tablet 25 mg, 25 mg, Oral, TID PRN, Little Ishikawa, MD   [START ON 04/15/2021] influenza vac split quadrivalent PF (FLUARIX) injection 0.5 mL, 0.5 mL, Intramuscular, Tomorrow-1000, Doutova, Anastassia, MD   insulin aspart (novoLOG) injection 0-5 Units, 0-5 Units, Subcutaneous, QHS, Little Ishikawa, MD   insulin aspart (novoLOG) injection 0-9 Units, 0-9 Units, Subcutaneous, TID WC, Little Ishikawa, MD, 3 Units at 04/14/21 0828   insulin glargine-yfgn (SEMGLEE) injection 20 Units, 20 Units, Subcutaneous, QHS, Little Ishikawa, MD   losartan (COZAAR) tablet 25 mg, 25 mg, Oral, Daily, Mariel Aloe, MD, 25 mg at 04/14/21 0946   senna-docusate (Senokot-S) tablet 1 tablet, 1 tablet, Oral, QHS PRN, Little Ishikawa, MD   sertraline (ZOLOFT) tablet 50 mg, 50 mg, Oral, Daily, Little Ishikawa, MD, 50 mg at 04/14/21 3151  Current Outpatient Medications  Medication Instructions   atorvastatin (LIPITOR) 20 mg, Oral, Daily   Blood Glucose Monitoring Suppl (TRUE METRIX METER) w/Device KIT Use as instructed. Check blood glucose level by fingerstick 3-5 times per day.   Dulaglutide 1.5 MG/0.5ML SOPN INJECT 1.5 MG INTO THE SKIN ONCE A WEEK.   glucose blood test strip USE AS INSTRUCTED. CHECK BLOOD GLUCOSE LEVEL  BY FINGERSTICK 3-5 TIMES PER DAY.   glucose monitoring kit (FREESTYLE) monitoring kit 1 each, Does not apply, 3 times daily after meals & bedtime, 1 month Diabetic Testing Supplies for QAC-QHS accuchecks.   hydrOXYzine (VISTARIL) 25 MG capsule Take 1 capsule twice daily as needed for anxiety   Insulin Pen  Needle (PEN NEEDLES) 32G X 4 MM MISC Use as instructed. Inject into the skin 5 times per day   Insulin Pen Needle 32G X 4 MM MISC USE AS INSTRUCTED. INJECT INTO THE SKIN 5 TIMES PER DAY   Lantus SoloStar 40 Units, Subcutaneous, Daily   metFORMIN (GLUCOPHAGE-XR) 500 MG 24 hr tablet TAKE 2 TABLETS (1,000 MG TOTAL) BY MOUTH 2 (TWO) TIMES DAILY.   sertraline (ZOLOFT) 50 MG tablet take 1 tablet by mouth daily   TRUEplus Lancets 28G MISC USE AS INSTRUCTED. CHECK BLOOD GLUCOSE LEVEL BY FINGERSTICK 3-5 TIMES PER DAY.     Family History  Problem Relation Age of Onset   Diabetes type II Mother    Diabetes Mother      Social History:  reports that he has been smoking. He has been smoking an average of 1 pack per day. He has never used smokeless tobacco. He reports that he does not currently use drugs after having used the following drugs: Methamphetamines. He reports that he does not drink alcohol.   Exam: Current vital signs: BP (!) 160/98 (BP Location: Left Arm)   Pulse 66   Temp 98.3 F (36.8 C)   Resp 19   Ht 6' (1.829 m)   Wt 131.5 kg   SpO2 96%   BMI 39.33 kg/m  Vital signs in last 24 hours: Temp:  [98.3 F (36.8 C)-98.6 F (37 C)] 98.3 F (36.8 C) (09/16 0609) Pulse Rate:  [66-99] 66 (09/16 0609) Resp:  [15-19] 19 (09/16 0609) BP: (138-172)/(82-111) 160/98 (09/16 0609) SpO2:  [94 %-100 %] 96 % (09/16 0609) Weight:  [131.5 kg] 131.5 kg (09/15 2140)   Physical Exam  Constitutional: Appears well-developed and well-nourished.  Obese Psych: Affect appropriate to situation, pleasant and cooperative Eyes: No scleral injection HENT: No oropharyngeal obstruction.  MSK: no joint deformities.  Cardiovascular: Normal rate and regular rhythm.  Respiratory: Effort normal, non-labored breathing GI: Soft.  No distension. There is no tenderness.  Skin: Warm dry and intact visible skin  Neuro: Mental Status: Patient is awake, alert, oriented to person, place, month, year, and  situation. Patient is able to give a clear and coherent history. No signs of aphasia or neglect Cranial Nerves: II: Visual Fields are full. Pupils are equal, round, and reactive to light.   III,IV, VI: EOMI without ptosis or diploplia.  V: Facial sensation is symmetric to temperature VII: Facial movement is symmetric.  VIII: hearing is intact to voice X: Uvula elevates symmetrically XI: Shoulder shrug is symmetric. XII: tongue is midline without atrophy or fasciculations.  Motor: Tone is normal. Bulk is normal. 5/5 strength was present in all four extremities.  Sensory: Sensation is symmetric to light touch and temperature in the arms and legs. Deep Tendon Reflexes: 2+ and symmetric in the biceps and patellae.  Cerebellar: FNF and HKS are intact bilaterally  NIHSS total 0     I have reviewed labs in epic and the results pertinent to this consultation are: Lab Results  Component Value Date   CHOL 289 (H) 04/14/2021   HDL 38 (L) 04/14/2021   LDLCALC UNABLE TO CALCULATE IF TRIGLYCERIDE OVER 400 mg/dL 04/14/2021  TRIG 891 (H) 04/14/2021   CHOLHDL 7.6 04/14/2021   Lab Results  Component Value Date   HGBA1C 7.9 (H) 04/14/2021    I have personally reviewed the images obtained and agree with radiology read:  MRA HEAD FINDINGS   Both internal carotid arteries are widely patent into the brain. No siphon stenosis. The anterior and middle cerebral vessels are patent without proximal stenosis, aneurysm or vascular malformation.   Both vertebral arteries are widely patent to the basilar. No basilar stenosis. Posterior circulation branch vessels appear normal. Right PCA takes fetal origin from the anterior circulation.   MRA NECK FINDINGS   Neck vessels appear normal. No evidence of atherosclerotic disease or stenosis. No evidence of dissection. Antegrade flow in all major vessels.   IMPRESSION: MRI head: No acute finding. Minimal/early chronic appearing small-vessel  change of the pons. Otherwise normal.   Impression: Patient with multiple vascular risk factors as detailed above presenting with likely right lacunar TIA fortunately with resolution of symptoms.  Recommendations: -TTE with bubble study still pending (ordered) -A1c goal less than 7%, titrate medications as needed, not meeting goal at 7.9% -Severe hypertriglyceridemia, per primary team (consider icosapent ethyl) -Increase home atorvastatin from 20 mg to 80 mg nightly -Aspirin 81 mg daily -Plavix with 300 mg loading dose followed by 75 mg daily for 21 days -Neurology will follow-up TTE and otherwise be available on an as-needed basis going forward   [place] [phone] Available 7 PM to 7 AM, outside of these hours please call Neurologist on call as listed on Amion.

## 2021-04-14 NOTE — Progress Notes (Signed)
SLP Cancellation Note  Patient Details Name: Ayyan Sites MRN: [id number] DOB: [birth year]   Cancelled treatment:       Reason Eval/Treat Not Completed: Other (comment) Pt passed [place], diet initiated. No need for SLP swallow eval   Laranda Burkemper, Riley Nearing 04/14/2021, 1:21 PM

## 2021-04-14 NOTE — Discharge Instructions (Signed)
Jason Wolf,  You were in the hospital and likely had a TIA. Your workup for a stroke was negative but TIAs can predispose you to a full stroke. You will be discharged on aspirin and Plavix (21 days) in addition to an increase in Lipitor. Your triglycerides were also high but this may be because you ate food before the lab draw. Please follow-up with your PCP for repeat lipid panel and hospital follow-up. Please also follow-up with the neurologist.

## 2021-04-14 NOTE — Evaluation (Signed)
Physical Therapy Evaluation Patient Details Name: Jason Wolf MRN: [id number] DOB: Mar 21, 1981 Today's Date: 04/14/2021  History of Present Illness  Pt is 40 yo male admitted with HTN emergency and slight L LE weakness on 04/13/21.  MRI negative for acute changes.  Pt with hx of bipolar, HTN, HLD< polysubstance abuse, ETOH abuse, and DM2.  Clinical Impression  Pt admitted with above diagnosis.  He feels everything is back to baseline.  Demonstrates normal strength, balance, coordination, mobility.  Denies dizziness or visual changes.  Ambulated and performed flight of steps independently.  VSS.  Safe to mobilize independently (instructed to stay on unit)- no further PT needs.        Recommendations for follow up therapy are one component of a multi-disciplinary discharge planning process, led by the attending physician.  Recommendations may be updated based on patient status, additional functional criteria and insurance authorization.  Follow Up Recommendations No PT follow up    Equipment Recommendations  None recommended by PT    Recommendations for Other Services       Precautions / Restrictions Precautions Precautions: None      Mobility  Bed Mobility Overal bed mobility: Independent                  Transfers Overall transfer level: Independent               General transfer comment: Had supervision but demonstrated at safe/independent level  Ambulation/Gait Ambulation/Gait assistance: Independent Gait Distance (Feet): 300 Feet Assistive device: None Gait Pattern/deviations: WFL(Within Functional Limits) Gait velocity: normal   General Gait Details: Had supervision but demonstrated at safe/independent level; no deficits  Stairs Stairs: Yes Stairs assistance: Independent Stair Management: Alternating pattern;No rails Number of Stairs: 12 General stair comments: Had supervision but demonstrated at safe/independent level  Wheelchair Mobility     Modified Rankin (Stroke Patients Only) Modified Rankin (Stroke Patients Only) Pre-Morbid Rankin Score: No symptoms Modified Rankin: No symptoms     Balance Overall balance assessment: Independent                               Standardized Balance Assessment Standardized Balance Assessment : Dynamic Gait Index   Dynamic Gait Index Level Surface: Normal Change in Gait Speed: Normal Gait with Horizontal Head Turns: Normal Gait with Vertical Head Turns: Normal Gait and Pivot Turn: Normal Step Over Obstacle: Normal Step Around Obstacles: Normal Steps: Normal Total Score: 24       Pertinent Vitals/Pain Pain Assessment: No/denies pain    Home Living Family/patient expects to be discharged to:: Private residence Living Arrangements: Parent Available Help at Discharge: Family;Available PRN/intermittently Type of Home: Apartment Home Access: Stairs to enter Entrance Stairs-Rails: Doctor, general practice of Steps: 15 Home Layout: One level Home Equipment: None      Prior Function Level of Independence: Independent               Hand Dominance        Extremity/Trunk Assessment   Upper Extremity Assessment Upper Extremity Assessment: Overall WFL for tasks assessed (ROM WFL; MMT 5/5; finger tip to tip normal)    Lower Extremity Assessment Lower Extremity Assessment: Overall WFL for tasks assessed (ROM WFL; MMT 5/5; heel to shin normal)    Cervical / Trunk Assessment Cervical / Trunk Assessment: Normal  Communication   Communication: No difficulties  Cognition Arousal/Alertness: Awake/alert Behavior During Therapy: WFL for tasks assessed/performed Overall Cognitive Status: Within Functional  Limits for tasks assessed                                        General Comments      Exercises     Assessment/Plan    PT Assessment Patent does not need any further PT services  PT Problem List         PT Treatment  Interventions      PT Goals (Current goals can be found in the Care Plan section)  Acute Rehab PT Goals Patient Stated Goal: return home PT Goal Formulation: All assessment and education complete, DC therapy    Frequency     Barriers to discharge        Co-evaluation               AM-PAC PT "6 Clicks" Mobility  Outcome Measure Help needed turning from your back to your side while in a flat bed without using bedrails?: None Help needed moving from lying on your back to sitting on the side of a flat bed without using bedrails?: None Help needed moving to and from a bed to a chair (including a wheelchair)?: None Help needed standing up from a chair using your arms (e.g., wheelchair or bedside chair)?: None Help needed to walk in hospital room?: None Help needed climbing 3-5 steps with a railing? : None 6 Click Score: 24    End of Session   Activity Tolerance: Patient tolerated treatment well Patient left: in bed;with call bell/phone within reach Nurse Communication: Mobility status      Time: 7048-8891 PT Time Calculation (min) (ACUTE ONLY): 12 min   Charges:   PT Evaluation $PT Eval Low Complexity: 1 Low          Jason Wolf, PT Acute Rehab Services Pager [phone] [facility] [phone]   Jason Wolf 04/14/2021, 10:10 AM

## 2021-04-14 NOTE — Progress Notes (Signed)
OT Cancellation Note  Patient Details Name: Chrisangel Eskenazi MRN: [id number] DOB: [birth year]   Cancelled Treatment:    Reason Eval/Treat Not Completed: OT screened, no needs identified, will sign off. All symptoms have resolved without any deficits per PT note. OT will sign off.   Zaya Kessenich L Greg Cratty 04/14/2021, 1:26 PM

## 2021-04-14 NOTE — Progress Notes (Signed)
  Echocardiogram 2D Echocardiogram has been performed.  Jason Wolf 04/14/2021, 1:48 PM

## 2021-04-14 NOTE — Discharge Summary (Signed)
Physician Discharge Summary  Nazaiah Navarrete MOQ:[id number] DOB: October 25, 1980 DOA: 04/13/2021  PCP: Gildardo Pounds, NP  Admit date: 04/13/2021 Discharge date: 04/14/2021  Admitted From: Home Disposition: Home  Recommendations for Outpatient Follow-up:  Follow up with PCP in 1 week Follow up with neurology in 4 weeks Please recheck fasting lipid panel in 3-5 days for evaluation of triglycerides Please follow up on the following pending results: None  Home Health: None Equipment/Devices: None  Discharge Condition: Stable CODE STATUS: Full code Diet recommendation: Carb modified diet   Brief/Interim Summary:  Admission HPI written by Holli Humbles, MD   HPI: Tremont Gavitt is a 40 y.o. male with medical history significant of bipolar disorder, hypertension, hyperlipidemia, polysubstance abuse, alcohol abuse, IV drug abuse, non-insulin-dependent diabetes mellitus type 2.  Patient presents with transient episodes of dizziness weakness presyncope and questionable unilateral weakness.  In the ED patient was evaluated by neuro with NIH stroke scale of 2 given left lower extremity weakness and dysarthria, tPA withheld due to low score, MRI currently pending.  Patient being admitted to [facility] for further evaluation and treatment with neurology team with medicine admitting as primary.   Hospital course:  TIA Neurology consulted. Patient with dyasrthia and LLE weakness which resolved. Aspirin 325 mg and Plavix 300 mg loads given. CT head without acute findings. MRI negative for acute CVA. Transthoracic Echocardiogram with bubble study significant for no evidence of thrombus/source of embolism in addition to no evidence of PFO. Hemoglobin A1C of 7.9% and LDL could not be calculated because of high triglycerides. Per neurology, likely right lacunar TIA with resolution of symptoms. Patient discharged on Plavix 75 mg for 21 days, aspirin 81 mg indefinitely. Patient increased to Lipitor  80 mg daily. Will need more adequate diabetes control to reduce A1C below 7%.  Hypertensive urgency Primary hypertension Patient with a history of hypertension but not on antihypertensive medications currently. Patient started on losartan 25 mg daily. Continue on discharge. Titrate as needed for improved blood pressure control.  Hyperlipidemia Patient is on Lipitor 20 mg daily as an outpatient. Increased to Lipitor 80 mg daily on discharge.  Possibly hypertriglyceridemia Triglycerides of 891 on lipid panel. Although this was meant to be a fasting lipid panel, patient had eaten about 1-2 hours prior to study. Recommendation to obtain outpatient fasting lipid panel and start triglyceride lowering therapy as indicated.  History of polysubstance abuse Noted. UDS negative.  Diabetes mellitus, type 2 Uncontrolled with hyperglycemia. Hemoglobin A1C of 7.9%. Resume home insulin regimen.  Obesity Body mass index is 39.33 kg/m.   Discharge Diagnoses:  Active Problems:   Hypertensive emergency, no CHF   CVA (cerebral vascular accident) [facility])    Discharge Instructions   Allergies as of 04/14/2021       Reactions   Codeine Nausea And Vomiting        Medication List     TAKE these medications    aspirin 81 MG EC tablet Take 1 tablet (81 mg total) by mouth daily. Swallow whole.   atorvastatin 80 MG tablet Commonly known as: LIPITOR Take 1 tablet (80 mg total) by mouth daily. What changed:  medication strength how much to take   clopidogrel 75 MG tablet Commonly known as: PLAVIX Take 1 tablet (75 mg total) by mouth daily for 21 days.   glucose monitoring kit monitoring kit 1 each by Does not apply route 4 (four) times daily - after meals and at bedtime. 1 month Diabetic Testing Supplies for  QAC-QHS accuchecks.   True Metrix Meter w/Device Kit Use as instructed. Check blood glucose level by fingerstick 3-5 times per day.   hydrOXYzine 25 MG capsule Commonly known as:  VISTARIL Take 1 capsule twice daily as needed for anxiety   Lantus SoloStar 100 UNIT/ML Solostar Pen Generic drug: insulin glargine Inject 40 Units into the skin daily.   losartan 25 MG tablet Commonly known as: COZAAR Take 1 tablet (25 mg total) by mouth daily.   metFORMIN 500 MG 24 hr tablet Commonly known as: GLUCOPHAGE-XR TAKE 2 TABLETS (1,000 MG TOTAL) BY MOUTH 2 (TWO) TIMES DAILY.   Pen Needles 32G X 4 MM Misc Use as instructed. Inject into the skin 5 times per day   NovoFine Plus Pen Needle 32G X 4 MM Misc Generic drug: Insulin Pen Needle USE AS INSTRUCTED. INJECT INTO THE SKIN 5 TIMES PER DAY   sertraline 50 MG tablet Commonly known as: ZOLOFT take 1 tablet by mouth daily   True Metrix Blood Glucose Test test strip Generic drug: glucose blood USE AS INSTRUCTED. CHECK BLOOD GLUCOSE LEVEL BY FINGERSTICK 3-5 TIMES PER DAY.   TRUEplus Lancets 28G Misc USE AS INSTRUCTED. CHECK BLOOD GLUCOSE LEVEL BY FINGERSTICK 3-5 TIMES PER DAY.   Trulicity 1.5 PJ/8.2NK Sopn Generic drug: Dulaglutide INJECT 1.5 MG INTO THE SKIN ONCE A WEEK.        Allergies  Allergen Reactions   Codeine Nausea And Vomiting    Consultations: Neurology   Procedures/Studies: CT HEAD WO CONTRAST (5MM)  Result Date: 04/13/2021 CLINICAL DATA:  Neuro deficit, acute, stroke suspected. EXAM: CT HEAD WITHOUT CONTRAST TECHNIQUE: Contiguous axial images were obtained from the base of the skull through the vertex without intravenous contrast. COMPARISON:  Report from head CT 07/13/2018 (images currently unavailable). FINDINGS: Brain: Cerebral volume is normal. Small well circumscribed hypodensity within the inferior left basal ganglia, likely reflecting a prominent perivascular space. There is no acute intracranial hemorrhage. No demarcated cortical infarct. No extra-axial fluid collection. No evidence of an intracranial mass. No midline shift. Vascular: No hyperdense vessel. Skull: Normal. Negative for  fracture or focal lesion. Sinuses/Orbits: Visualized orbits show no acute finding. Mild partial opacification of the left ethmoid air cells. Background trace scattered mucosal thickening elsewhere within the bilateral ethmoid sinuses. IMPRESSION: No evidence of acute intracranial abnormality. Paranasal sinus disease, as described. Electronically Signed   By: Kellie Simmering D.O.   On: 04/13/2021 15:30   MR ANGIO HEAD WO CONTRAST  Result Date: 04/13/2021 CLINICAL DATA:  Neurological deficit, acute, stroke suspected. EXAM: MRI HEAD WITHOUT CONTRAST MRA HEAD WITHOUT CONTRAST MRA NECK WITHOUT CONTRAST TECHNIQUE: Multiplanar, multiecho pulse sequences of the brain and surrounding structures were obtained without intravenous contrast. Angiographic images of the Circle of Willis were obtained using MRA technique without intravenous contrast. Angiographic images of the neck were obtained using MRA technique without intravenous contrast. Carotid stenosis measurements (when applicable) are obtained utilizing NASCET criteria, using the distal internal carotid diameter as the denominator. COMPARISON:  Head CT same day FINDINGS: MRI HEAD FINDINGS Brain: Mild chronic small-vessel ischemic change affects the pons. No focal cerebellar finding. Cerebral hemispheres appear normal without evidence of old or acute infarction. No mass lesion, hemorrhage, hydrocephalus or extra-axial collection. Vascular: Major vessels at the base of the brain show flow. Skull and upper cervical spine: Negative Sinuses/Orbits: Clear/normal Other: None MRA HEAD FINDINGS Both internal carotid arteries are widely patent into the brain. No siphon stenosis. The anterior and middle cerebral vessels are patent without proximal  stenosis, aneurysm or vascular malformation. Both vertebral arteries are widely patent to the basilar. No basilar stenosis. Posterior circulation branch vessels appear normal. Right PCA takes fetal origin from the anterior circulation.  MRA NECK FINDINGS Neck vessels appear normal. No evidence of atherosclerotic disease or stenosis. No evidence of dissection. Antegrade flow in all major vessels. IMPRESSION: MRI head: No acute finding. Minimal/early chronic appearing small-vessel change of the pons. Otherwise normal. MRA of the neck: Normal noncontrast examination. MRA of the head: Normal. Electronically Signed   By: Nelson Chimes M.D.   On: 04/13/2021 17:40   MR MRA NECK WO CONTRAST  Result Date: 04/13/2021 CLINICAL DATA:  Neurological deficit, acute, stroke suspected. EXAM: MRI HEAD WITHOUT CONTRAST MRA HEAD WITHOUT CONTRAST MRA NECK WITHOUT CONTRAST TECHNIQUE: Multiplanar, multiecho pulse sequences of the brain and surrounding structures were obtained without intravenous contrast. Angiographic images of the Circle of Willis were obtained using MRA technique without intravenous contrast. Angiographic images of the neck were obtained using MRA technique without intravenous contrast. Carotid stenosis measurements (when applicable) are obtained utilizing NASCET criteria, using the distal internal carotid diameter as the denominator. COMPARISON:  Head CT same day FINDINGS: MRI HEAD FINDINGS Brain: Mild chronic small-vessel ischemic change affects the pons. No focal cerebellar finding. Cerebral hemispheres appear normal without evidence of old or acute infarction. No mass lesion, hemorrhage, hydrocephalus or extra-axial collection. Vascular: Major vessels at the base of the brain show flow. Skull and upper cervical spine: Negative Sinuses/Orbits: Clear/normal Other: None MRA HEAD FINDINGS Both internal carotid arteries are widely patent into the brain. No siphon stenosis. The anterior and middle cerebral vessels are patent without proximal stenosis, aneurysm or vascular malformation. Both vertebral arteries are widely patent to the basilar. No basilar stenosis. Posterior circulation branch vessels appear normal. Right PCA takes fetal origin from the  anterior circulation. MRA NECK FINDINGS Neck vessels appear normal. No evidence of atherosclerotic disease or stenosis. No evidence of dissection. Antegrade flow in all major vessels. IMPRESSION: MRI head: No acute finding. Minimal/early chronic appearing small-vessel change of the pons. Otherwise normal. MRA of the neck: Normal noncontrast examination. MRA of the head: Normal. Electronically Signed   By: Nelson Chimes M.D.   On: 04/13/2021 17:40   MR BRAIN WO CONTRAST  Result Date: 04/13/2021 CLINICAL DATA:  Neurological deficit, acute, stroke suspected. EXAM: MRI HEAD WITHOUT CONTRAST MRA HEAD WITHOUT CONTRAST MRA NECK WITHOUT CONTRAST TECHNIQUE: Multiplanar, multiecho pulse sequences of the brain and surrounding structures were obtained without intravenous contrast. Angiographic images of the Circle of Willis were obtained using MRA technique without intravenous contrast. Angiographic images of the neck were obtained using MRA technique without intravenous contrast. Carotid stenosis measurements (when applicable) are obtained utilizing NASCET criteria, using the distal internal carotid diameter as the denominator. COMPARISON:  Head CT same day FINDINGS: MRI HEAD FINDINGS Brain: Mild chronic small-vessel ischemic change affects the pons. No focal cerebellar finding. Cerebral hemispheres appear normal without evidence of old or acute infarction. No mass lesion, hemorrhage, hydrocephalus or extra-axial collection. Vascular: Major vessels at the base of the brain show flow. Skull and upper cervical spine: Negative Sinuses/Orbits: Clear/normal Other: None MRA HEAD FINDINGS Both internal carotid arteries are widely patent into the brain. No siphon stenosis. The anterior and middle cerebral vessels are patent without proximal stenosis, aneurysm or vascular malformation. Both vertebral arteries are widely patent to the basilar. No basilar stenosis. Posterior circulation branch vessels appear normal. Right PCA takes  fetal origin from the anterior circulation. MRA NECK  FINDINGS Neck vessels appear normal. No evidence of atherosclerotic disease or stenosis. No evidence of dissection. Antegrade flow in all major vessels. IMPRESSION: MRI head: No acute finding. Minimal/early chronic appearing small-vessel change of the pons. Otherwise normal. MRA of the neck: Normal noncontrast examination. MRA of the head: Normal. Electronically Signed   By: Nelson Chimes M.D.   On: 04/13/2021 17:40   DG Foot Complete Right  Result Date: 03/31/2021 CLINICAL DATA:  Right foot pain, injury, trauma EXAM: RIGHT FOOT COMPLETE - 3+ VIEW COMPARISON:  None. FINDINGS: There is no evidence of fracture or dislocation. There is no evidence of arthropathy or other focal bone abnormality. Soft tissues are unremarkable. Small plantar calcaneal spur. IMPRESSION: Negative. Electronically Signed   By: Jerilynn Mages.  Shick M.D.   On: 03/31/2021 13:06   ECHOCARDIOGRAM COMPLETE BUBBLE STUDY  Result Date: 04/14/2021    ECHOCARDIOGRAM REPORT   Patient Name:   HANK WALLING Date of Exam: 04/14/2021 Medical Rec #:  [id number]    Height:       72.0 in Accession #:    [id number]   Weight:       290.0 lb Date of Birth:  02/19/1981     BSA:          2.495 m Patient Age:    40 years     BP:           148/87 mmHg Patient Gender: M            HR:           72 bpm. Exam Location:  Inpatient Procedure: 2D Echo, 3D Echo, Cardiac Doppler, Color Doppler and Saline Contrast            Bubble Study Indications:    TIA.  History:        Patient has no prior history of Echocardiogram examinations.                 Risk Factors:Hypertension and Diabetes. IVDU.  Sonographer:    Roseanna Rainbow RDCS Referring Phys: [id number] [place]  Sonographer Comments: Technically difficult study due to poor echo windows and patient is morbidly obese. Image acquisition challenging due to patient body habitus. IMPRESSIONS  1. Left ventricular ejection fraction, by estimation, is 60 to 65%. Left ventricular  ejection fraction by 3D volume is 59 %. The left ventricle has normal function. The left ventricle has no regional wall motion abnormalities. Left ventricular diastolic  parameters were normal.  2. Right ventricular systolic function is normal. The right ventricular size is normal.  3. The mitral valve is normal in structure. No evidence of mitral valve regurgitation.  4. The aortic valve is tricuspid. Aortic valve regurgitation is not visualized. No aortic stenosis is present.  5. The inferior vena cava is normal in size with greater than 50% respiratory variability, suggesting right atrial pressure of 3 mmHg.  6. Agitated saline contrast bubble study was negative, with no evidence of any interatrial shunt. Comparison(s): No prior Echocardiogram. Conclusion(s)/Recommendation(s): No intracardiac source of embolism detected on this transthoracic study. A transesophageal echocardiogram is recommended to exclude cardiac source of embolism if clinically indicated. FINDINGS  Left Ventricle: Left ventricular ejection fraction, by estimation, is 60 to 65%. Left ventricular ejection fraction by 3D volume is 59 %. The left ventricle has normal function. The left ventricle has no regional wall motion abnormalities. The left ventricular internal cavity size was normal in size. There is no left ventricular hypertrophy. Left ventricular diastolic parameters were  normal. Right Ventricle: The right ventricular size is normal. No increase in right ventricular wall thickness. Right ventricular systolic function is normal. Left Atrium: Left atrial size was normal in size. Right Atrium: Right atrial size was normal in size. Pericardium: There is no evidence of pericardial effusion. Mitral Valve: The mitral valve is normal in structure. No evidence of mitral valve regurgitation. Tricuspid Valve: The tricuspid valve is normal in structure. Tricuspid valve regurgitation is not demonstrated. Aortic Valve: The aortic valve is tricuspid.  Aortic valve regurgitation is not visualized. No aortic stenosis is present. Pulmonic Valve: The pulmonic valve was normal in structure. Pulmonic valve regurgitation is not visualized. No evidence of pulmonic stenosis. Aorta: The aortic root and ascending aorta are structurally normal, with no evidence of dilitation. Venous: The inferior vena cava is normal in size with greater than 50% respiratory variability, suggesting right atrial pressure of 3 mmHg. IAS/Shunts: The atrial septum is grossly normal. Agitated saline contrast was given intravenously to evaluate for intracardiac shunting. Agitated saline contrast bubble study was negative, with no evidence of any interatrial shunt.  LEFT VENTRICLE PLAX 2D LVIDd:         5.00 cm         Diastology LVIDs:         3.10 cm         LV e' medial:    7.18 cm/s LV PW:         1.40 cm         LV E/e' medial:  14.1 LV IVS:        1.00 cm         LV e' lateral:   11.70 cm/s LVOT diam:     2.20 cm         LV E/e' lateral: 8.6 LV SV:         68 LV SV Index:   27 LVOT Area:     3.80 cm        3D Volume EF                                LV 3D EF:    Left                                             ventricul LV Volumes (MOD)                            ar LV vol d, MOD    105.0 ml                   ejection A2C:                                        fraction LV vol d, MOD    89.3 ml                    by 3D A4C:                                        volume is LV  vol s, MOD    27.7 ml                    59 %. A2C: LV vol s, MOD    33.0 ml A4C:                           3D Volume EF: LV SV MOD A2C:   77.3 ml       3D EF:        59 % LV SV MOD A4C:   89.3 ml       LV EDV:       225 ml LV SV MOD BP:    68.6 ml       LV ESV:       93 ml                                LV SV:        132 ml RIGHT VENTRICLE             IVC RV S prime:     15.90 cm/s  IVC diam: 1.85 cm TAPSE (M-mode): 2.5 cm LEFT ATRIUM             Index       RIGHT ATRIUM           Index LA diam:        3.60 cm 1.44 cm/m   RA Area:     12.40 cm LA Vol (A2C):   44.4 ml 17.80 ml/m RA Volume:   25.60 ml  10.26 ml/m LA Vol (A4C):   43.7 ml 17.51 ml/m LA Biplane Vol: 46.5 ml 18.64 ml/m  AORTIC VALVE LVOT Vmax:   98.50 cm/s LVOT Vmean:  63.500 cm/s LVOT VTI:    0.180 m  AORTA Ao Root diam: 3.50 cm Ao Asc diam:  3.80 cm MITRAL VALVE MV Area (PHT): 3.03 cm     SHUNTS MV Decel Time: 250 msec     Systemic VTI:  0.18 m MV E velocity: 101.00 cm/s  Systemic Diam: 2.20 cm MV A velocity: 64.00 cm/s MV E/A ratio:  1.58 Rudean Haskell MD Electronically signed by Rudean Haskell MD Signature Date/Time: 04/14/2021/2:25:20 PM    Final       Subjective: No issues this morning. No weakness or headache  Discharge Exam: Vitals:   04/14/21 1028 04/14/21 1415  BP: (!) 148/87 (!) 158/94  Pulse: 79 79  Resp: 20 16  Temp: 98.1 F (36.7 C) 97.8 F (36.6 C)  SpO2: 97% 98%   Vitals:   04/14/21 0241 04/14/21 0609 04/14/21 1028 04/14/21 1415  BP: (!) 151/103 (!) 160/98 (!) 148/87 (!) 158/94  Pulse: 76 66 79 79  Resp:  _0 Temp: 98.5 F (36.9 C) 98.3 F (36.8 C) 98.1 F (36.7 C) 97.8 F (36.6 C)  TempSrc: Oral  Axillary Oral  SpO2: 97% 96% 97% 98%  Weight:      Height:        General: Pt is alert, awake, not in acute distress Cardiovascular: RRR, S1/S2 +, no rubs, no gallops Respiratory: CTA bilaterally, no wheezing, no rhonchi Abdominal: Soft, NT, ND, bowel sounds + Extremities: no edema, no cyanosis    The results of significant diagnostics from this hospitalization (including imaging, microbiology, ancillary and laboratory) are listed below for reference.  Microbiology: Recent Results (from the past 240 hour(s))  Resp Panel by RT-PCR (Flu A&B, Covid) Nasopharyngeal Swab     Status: None   Collection Time: 04/13/21  3:50 PM   Specimen: Nasopharyngeal Swab; Nasopharyngeal(NP) swabs in vial transport medium  Result Value Ref Range Status   SARS Coronavirus 2 by RT PCR NEGATIVE NEGATIVE Final     Comment: (NOTE) SARS-CoV-2 target nucleic acids are NOT DETECTED.  The SARS-CoV-2 RNA is generally detectable in upper respiratory specimens during the acute phase of infection. The lowest concentration of SARS-CoV-2 viral copies this assay can detect is 138 copies/mL. A negative result does not preclude SARS-Cov-2 infection and should not be used as the sole basis for treatment or other patient management decisions. A negative result may occur with  improper specimen collection/handling, submission of specimen other than nasopharyngeal swab, presence of viral mutation(s) within the areas targeted by this assay, and inadequate number of viral copies(<138 copies/mL). A negative result must be combined with clinical observations, patient history, and epidemiological information. The expected result is Negative.  Fact Sheet for Patients:  EntrepreneurPulse.com.au  Fact Sheet for Healthcare Providers:  IncredibleEmployment.be  This test is no t yet approved or cleared by the [place] FDA and  has been authorized for detection and/or diagnosis of SARS-CoV-2 by FDA under an Emergency Use Authorization (EUA). This EUA will remain  in effect (meaning this test can be used) for the duration of the COVID-19 declaration under Section 564(b)(1) of the Act, 21 U.S.C.section 360bbb-3(b)(1), unless the authorization is terminated  or revoked sooner.       Influenza A by PCR NEGATIVE NEGATIVE Final   Influenza B by PCR NEGATIVE NEGATIVE Final    Comment: (NOTE) The Xpert Xpress SARS-CoV-2/FLU/RSV plus assay is intended as an aid in the diagnosis of influenza from Nasopharyngeal swab specimens and should not be used as a sole basis for treatment. Nasal washings and aspirates are unacceptable for Xpert Xpress SARS-CoV-2/FLU/RSV testing.  Fact Sheet for Patients: EntrepreneurPulse.com.au  Fact Sheet for Healthcare  Providers: IncredibleEmployment.be  This test is not yet approved or cleared by the [place] FDA and has been authorized for detection and/or diagnosis of SARS-CoV-2 by FDA under an Emergency Use Authorization (EUA). This EUA will remain in effect (meaning this test can be used) for the duration of the COVID-19 declaration under Section 564(b)(1) of the Act, 21 U.S.C. section 360bbb-3(b)(1), unless the authorization is terminated or revoked.  Performed at [facility], [place] [address] [place], [place] [address]      Labs: BNP (last 3 results) No results for input(s): BNP in the last 8760 hours. Basic Metabolic Panel: Recent Labs  Lab 04/13/21 1504 04/14/21 0423  NA 132* 135  K 4.5 4.1  CL 98 103  CO2 23 25  GLUCOSE 184* 228*  BUN 10 10  CREATININE 0.89 0.72  CALCIUM 9.5 9.1   Liver Function Tests: Recent Labs  Lab 04/13/21 1430  AST 38  ALT 30  ALKPHOS 65  BILITOT 1.3*  PROT 7.8  ALBUMIN 4.2   No results for input(s): LIPASE, AMYLASE in the last 168 hours. No results for input(s): AMMONIA in the last 168 hours. CBC: Recent Labs  Lab 04/13/21 1420 04/14/21 0423  WBC 8.9 9.2  NEUTROABS 4.7  --   HGB 16.6 14.6  HCT 50.6 39.7  MCV 92.0 83.2  PLT 202 332   Cardiac Enzymes: No results for input(s): CKTOTAL, CKMB, CKMBINDEX, TROPONINI in the last 168  hours. BNP: Invalid input(s): POCBNP CBG: Recent Labs  Lab 04/13/21 1530 04/13/21 2128 04/14/21 0248 04/14/21 0746 04/14/21 1107  GLUCAP 224* 147* 282* 219* 194*   D-Dimer No results for input(s): DDIMER in the last 72 hours. Hgb A1c Recent Labs    04/14/21 0423  HGBA1C 7.9*   Lipid Profile Recent Labs    04/14/21 0423  CHOL 289*  HDL 38*  LDLCALC UNABLE TO CALCULATE IF TRIGLYCERIDE OVER 400 mg/dL  TRIG 891*  CHOLHDL 7.6  LDLDIRECT 136.3*   Thyroid function studies No results for input(s): TSH, T4TOTAL, T3FREE, THYROIDAB in the last 72  hours.  Invalid input(s): FREET3 Anemia work up No results for input(s): VITAMINB12, FOLATE, FERRITIN, TIBC, IRON, RETICCTPCT in the last 72 hours. Urinalysis    Component Value Date/Time   COLORURINE YELLOW 04/13/2021 1425   APPEARANCEUR CLEAR 04/13/2021 1425   APPEARANCEUR Clear 11/10/2014 1132   LABSPEC 1.012 04/13/2021 1425   LABSPEC 1.011 11/10/2014 1132   PHURINE 7.0 04/13/2021 1425   GLUCOSEU 150 (A) 04/13/2021 1425   GLUCOSEU Negative 11/10/2014 1132   HGBUR MODERATE (A) 04/13/2021 1425   BILIRUBINUR NEGATIVE 04/13/2021 1425   BILIRUBINUR Negative 11/10/2014 1132   KETONESUR NEGATIVE 04/13/2021 1425   PROTEINUR NEGATIVE 04/13/2021 1425   NITRITE NEGATIVE 04/13/2021 1425   LEUKOCYTESUR NEGATIVE 04/13/2021 1425   LEUKOCYTESUR Negative 11/10/2014 1132   Sepsis Labs Invalid input(s): PROCALCITONIN,  WBC,  LACTICIDVEN Microbiology Recent Results (from the past 240 hour(s))  Resp Panel by RT-PCR (Flu A&B, Covid) Nasopharyngeal Swab     Status: None   Collection Time: 04/13/21  3:50 PM   Specimen: Nasopharyngeal Swab; Nasopharyngeal(NP) swabs in vial transport medium  Result Value Ref Range Status   SARS Coronavirus 2 by RT PCR NEGATIVE NEGATIVE Final    Comment: (NOTE) SARS-CoV-2 target nucleic acids are NOT DETECTED.  The SARS-CoV-2 RNA is generally detectable in upper respiratory specimens during the acute phase of infection. The lowest concentration of SARS-CoV-2 viral copies this assay can detect is 138 copies/mL. A negative result does not preclude SARS-Cov-2 infection and should not be used as the sole basis for treatment or other patient management decisions. A negative result may occur with  improper specimen collection/handling, submission of specimen other than nasopharyngeal swab, presence of viral mutation(s) within the areas targeted by this assay, and inadequate number of viral copies(<138 copies/mL). A negative result must be combined with clinical  observations, patient history, and epidemiological information. The expected result is Negative.  Fact Sheet for Patients:  EntrepreneurPulse.com.au  Fact Sheet for Healthcare Providers:  IncredibleEmployment.be  This test is no t yet approved or cleared by the [place] FDA and  has been authorized for detection and/or diagnosis of SARS-CoV-2 by FDA under an Emergency Use Authorization (EUA). This EUA will remain  in effect (meaning this test can be used) for the duration of the COVID-19 declaration under Section 564(b)(1) of the Act, 21 U.S.C.section 360bbb-3(b)(1), unless the authorization is terminated  or revoked sooner.       Influenza A by PCR NEGATIVE NEGATIVE Final   Influenza B by PCR NEGATIVE NEGATIVE Final    Comment: (NOTE) The Xpert Xpress SARS-CoV-2/FLU/RSV plus assay is intended as an aid in the diagnosis of influenza from Nasopharyngeal swab specimens and should not be used as a sole basis for treatment. Nasal washings and aspirates are unacceptable for Xpert Xpress SARS-CoV-2/FLU/RSV testing.  Fact Sheet for Patients: EntrepreneurPulse.com.au  Fact Sheet for Healthcare Providers: IncredibleEmployment.be  This test is  not yet approved or cleared by the [place] and has been authorized for detection and/or diagnosis of SARS-CoV-2 by FDA under an Emergency Use Authorization (EUA). This EUA will remain in effect (meaning this test can be used) for the duration of the COVID-19 declaration under Section 564(b)(1) of the Act, 21 U.S.C. section 360bbb-3(b)(1), unless the authorization is terminated or revoked.  Performed at [facility], [place] [address] [place], [place] [address]     SIGNED:   Cordelia Poche, MD [facility] 04/14/2021, 2:47 PM

## 2021-04-14 NOTE — Progress Notes (Signed)
Inpatient Diabetes Program Recommendations  AACE/ADA: New Consensus Statement on Inpatient Glycemic Control (2015)  Target Ranges:  Prepandial:   less than 140 mg/dL      Peak postprandial:   less than 180 mg/dL (1-2 hours)      Critically ill patients:  140 - 180 mg/dL  Results for Jason Wolf, Jason Wolf (MRN [id number]) as of 04/14/2021 08:27  Ref. Range 04/13/2021 15:30 04/13/2021 21:28 04/14/2021 02:48 04/14/2021 07:46  Glucose-Capillary Latest Ref Range: 70 - 99 mg/dL 789 (H) 381 (H)  20 units Lantus 282 (H) 219 (H)  Results for Jason Wolf, Jason Wolf (MRN [id number]) as of 04/14/2021 08:27  Ref. Range 04/14/2021 04:23  Hemoglobin A1C Latest Ref Range: 4.8 - 5.6 % 7.9 (H)    Admit with: Unilateral weakness/dizziness Hypertensive emergency, rule out CVA versus TIA  History: DM2, Polysubstance abuse, Alcohol abuse, IV drug abuse  Home DM Meds: Trulicity 1.5 mg Qweek        Lantus 40 units Daily        Metformin 1000 mg BID  Current Orders: Semglee 20 units QHS      Novolog Sensitive Correction Scale/ SSI (0-9 units) TID AC + HS    MD- Note CBG 219 this AM (received 20 units Lantus last PM)  Please consider:  1. Increase Semglee to 25 units QHS  2. Increase Novolog SSI to the 0-15 unit (Moderate) scale    --Will follow patient during hospitalization--  Ambrose Finland RN, MSN, CDE Diabetes Coordinator Inpatient Glycemic Control Team Team Pager: [phone] (8a-5p)

## 2021-04-14 NOTE — Plan of Care (Signed)
  Problem: Clinical Measurements: Goal: Ability to maintain clinical measurements within normal limits will improve Outcome: Adequate for Discharge   Problem: Nutrition: Goal: Adequate nutrition will be maintained Outcome: Adequate for Discharge   Problem: Education: Goal: Knowledge of General Education information will improve Description: Including pain rating scale, medication(s)/side effects and non-pharmacologic comfort measures Outcome: Adequate for Discharge

## 2021-04-14 NOTE — Plan of Care (Signed)
Echo with bubble study with normal EF and no thrombus, PFO.   Neuro will be available on an as needed basis.   Jimmye Norman, MSN, APN-BC Neurology Nurse Practitioner Pager [phone]

## 2021-04-17 ENCOUNTER — Telehealth: Payer: Self-pay

## 2021-04-17 NOTE — Telephone Encounter (Signed)
Transition Care Management Follow-up Telephone Call Date of discharge and from where: [facility] on 04/14/2021 How have you been since you were released from the hospital? He said she is feeling good  Any questions or concerns? None verbalized  Items Reviewed: Did the pt receive and understand the discharge instructions provided? Yes  Medications obtained and verified? Yes - he said she has all medications and did not have any questions about her med regime.   Other? No  Any new allergies since your discharge? No  Do you have support at home? Yes mother    Home Care and Equipment/Supplies: Were home health services ordered? NA    Functional Questionnaire: (I = Independent and D = Dependent) ADLs: independent.      Follow up appointments reviewed:   PCP Hospital f/u appt confirmed? Yes. Scheduled to see  NP Bertram Denver on 05/03/2021 Specialist Hospital f/u appt confirmed? None at this time Are transportation arrangements needed? No  If their condition worsens, is the pt aware to call PCP or go to the Emergency Dept.? Yes Was the patient provided with contact information for the PCP's office or ED? Yes Was to pt encouraged to call back with questions or concerns? Yes

## 2021-04-24 ENCOUNTER — Other Ambulatory Visit: Payer: Self-pay

## 2021-04-28 ENCOUNTER — Other Ambulatory Visit: Payer: Self-pay

## 2021-05-03 ENCOUNTER — Ambulatory Visit: Payer: Self-pay | Admitting: Nurse Practitioner

## 2021-05-08 ENCOUNTER — Other Ambulatory Visit: Payer: Self-pay

## 2021-05-12 ENCOUNTER — Other Ambulatory Visit: Payer: Self-pay

## 2021-05-29 ENCOUNTER — Other Ambulatory Visit: Payer: Self-pay

## 2021-06-08 ENCOUNTER — Other Ambulatory Visit: Payer: Self-pay

## 2021-06-14 ENCOUNTER — Ambulatory Visit: Payer: Self-pay | Attending: Nurse Practitioner | Admitting: Nurse Practitioner

## 2021-06-14 ENCOUNTER — Encounter: Payer: Self-pay | Admitting: Nurse Practitioner

## 2021-06-14 ENCOUNTER — Other Ambulatory Visit: Payer: Self-pay

## 2021-06-14 VITALS — BP 124/72 | HR 91 | Ht 72.0 in | Wt 289.5 lb

## 2021-06-14 DIAGNOSIS — I10 Essential (primary) hypertension: Secondary | ICD-10-CM

## 2021-06-14 DIAGNOSIS — F3181 Bipolar II disorder: Secondary | ICD-10-CM

## 2021-06-14 DIAGNOSIS — E1165 Type 2 diabetes mellitus with hyperglycemia: Secondary | ICD-10-CM

## 2021-06-14 DIAGNOSIS — Z09 Encounter for follow-up examination after completed treatment for conditions other than malignant neoplasm: Secondary | ICD-10-CM

## 2021-06-14 DIAGNOSIS — G459 Transient cerebral ischemic attack, unspecified: Secondary | ICD-10-CM

## 2021-06-14 MED ORDER — LOSARTAN POTASSIUM 25 MG PO TABS
25.0000 mg | ORAL_TABLET | Freq: Every day | ORAL | 1 refills | Status: DC
Start: 2021-06-14 — End: 2022-02-02
  Filled 2021-06-14: qty 30, 30d supply, fill #0

## 2021-06-14 MED ORDER — SERTRALINE HCL 50 MG PO TABS
ORAL_TABLET | ORAL | 2 refills | Status: DC
  Filled 2021-06-14 – 2021-06-22 (×2): qty 30, 30d supply, fill #0
  Filled 2021-08-04: qty 30, 30d supply, fill #1
  Filled 2021-08-04: qty 30, 30d supply, fill #0
  Filled 2021-09-15: qty 30, 30d supply, fill #1
  Filled 2021-10-18: qty 30, 30d supply, fill #2
  Filled 2021-11-20: qty 30, 30d supply, fill #3

## 2021-06-14 MED ORDER — DULAGLUTIDE 1.5 MG/0.5ML ~~LOC~~ SOAJ
1.5000 mg | SUBCUTANEOUS | 1 refills | Status: DC
Start: 2021-06-14 — End: 2022-02-02
  Filled 2021-06-14: qty 6, 84d supply, fill #0
  Filled 2021-08-18: qty 4, 56d supply, fill #0
  Filled 2021-08-18: qty 6, 84d supply, fill #0
  Filled 2021-08-28: qty 4, 56d supply, fill #0
  Filled 2021-10-18: qty 2, 28d supply, fill #1

## 2021-06-14 MED ORDER — METFORMIN HCL ER 500 MG PO TB24
ORAL_TABLET | Freq: Two times a day (BID) | ORAL | 2 refills | Status: DC
  Filled 2021-06-14: qty 120, fill #0
  Filled 2021-08-04 (×2): qty 120, 30d supply, fill #0
  Filled 2021-09-15: qty 120, 30d supply, fill #1
  Filled 2021-10-18: qty 120, 30d supply, fill #2

## 2021-06-14 MED ORDER — ATORVASTATIN CALCIUM 80 MG PO TABS
80.0000 mg | ORAL_TABLET | Freq: Every day | ORAL | 3 refills | Status: DC
Start: 2021-06-14 — End: 2022-02-02
  Filled 2021-06-14: qty 30, 30d supply, fill #0

## 2021-06-14 MED ORDER — HYDROXYZINE PAMOATE 25 MG PO CAPS
ORAL_CAPSULE | ORAL | 1 refills | Status: DC
Start: 2021-06-14 — End: 2022-02-02
  Filled 2021-06-14: qty 60, fill #0

## 2021-06-14 MED ORDER — INSULIN GLARGINE 100 UNIT/ML SOLOSTAR PEN
40.0000 [IU] | PEN_INJECTOR | Freq: Every day | SUBCUTANEOUS | 3 refills | Status: DC
  Filled 2021-06-14 – 2021-09-15 (×3): qty 12, 30d supply, fill #0
  Filled 2021-11-17: qty 12, 30d supply, fill #1

## 2021-06-14 MED ORDER — ASPIRIN 81 MG PO TBEC
81.0000 mg | DELAYED_RELEASE_TABLET | Freq: Every day | ORAL | 3 refills | Status: AC
  Filled 2021-06-14: qty 90, 90d supply, fill #0

## 2021-06-14 NOTE — Progress Notes (Signed)
Assessment & Plan:  Jason Wolf was seen today for hospitalization follow-up.  Diagnoses and all orders for this visit:  Hospital discharge follow-up  Essential hypertension -     losartan (COZAAR) 25 MG tablet; Take 1 tablet (25 mg total) by mouth daily.  Type 2 diabetes mellitus with hyperglycemia, unspecified whether long term insulin use (HCC) -     metFORMIN (GLUCOPHAGE-XR) 500 MG 24 hr tablet; TAKE 2 TABLETS (1,000 MG TOTAL) BY MOUTH 2 (TWO) TIMES DAILY. -     insulin glargine (LANTUS) 100 UNIT/ML Solostar Pen; Inject 40 Units into the skin daily. -     Dulaglutide 1.5 MG/0.5ML SOPN; INJECT 1.5 MG INTO THE SKIN ONCE A WEEK. -     atorvastatin (LIPITOR) 80 MG tablet; Take 1 tablet (80 mg total) by mouth daily.  TIA (transient ischemic attack) -     aspirin 81 MG EC tablet; Take 1 tablet (81 mg total) by mouth daily. Swallow whole.  Bipolar 2 disorder, major depressive episode (HCC) -     sertraline (ZOLOFT) 50 MG tablet; take 1 tablet by mouth daily -     hydrOXYzine (VISTARIL) 25 MG capsule; Take 1 capsule twice daily as needed for anxiety   Patient has been counseled on age-appropriate routine health concerns for screening and prevention. These are reviewed and up-to-date. Referrals have been placed accordingly. Immunizations are up-to-date or declined.    Subjective:   Chief Complaint  Patient presents with   Hospitalization Follow-up   HPI Jason Wolf 40 y.o. male presents to office today for HFU significant of bipolar disorder, hypertension, hyperlipidemia, polysubstance abuse, alcohol abuse, IV drug abuse, non-insulin-dependent diabetes mellitus type 2.   HFU Admitted with dysarthria and LLE weakness which resolved prior to discharge. Neurology was consulted: likely right lacunar TIA with resolution of symptoms CT head without acute findings. MRI negative for acute CVA. Transthoracic Echocardiogram with bubble study significant for no evidence of thrombus/source of  embolism in addition to no evidence of PFO. He was discharged on Plavix 75 mg for 21 days, aspirin 81 mg indefinitely. Patient increased to Lipitor 80 mg daily( triglycerides 891). Losartan 25 mg daily was started due to HTN.    HTN Blood pressure is well controlled today with losartan 25 mg daily.    Review of Systems  Constitutional:  Negative for fever, malaise/fatigue and weight loss.  HENT: Negative.  Negative for nosebleeds.   Eyes: Negative.  Negative for blurred vision, double vision and photophobia.  Respiratory: Negative.  Negative for cough and shortness of breath.   Cardiovascular: Negative.  Negative for chest pain, palpitations and leg swelling.  Gastrointestinal: Negative.  Negative for heartburn, nausea and vomiting.  Musculoskeletal: Negative.  Negative for myalgias.  Neurological: Negative.  Negative for dizziness, focal weakness, seizures and headaches.  Psychiatric/Behavioral: Negative.  Negative for suicidal ideas.    Past Medical History:  Diagnosis Date   Bipolar disorder ([place])    Diabetes mellitus without complication ([place])    ETOH abuse    Hypertension    Opiate abuse, episodic ([place])     Past Surgical History:  Procedure Laterality Date   I & D EXTREMITY Right 02/20/2020   Procedure: IRRIGATION AND DEBRIDEMENT HAND;  Surgeon: Verner Mould, MD;  Location: [facility];  Service: Orthopedics;  Laterality: Right;   KNEE ARTHROSCOPY      Family History  Problem Relation Age of Onset   Diabetes type II Mother    Diabetes Mother  Social History Reviewed with no changes to be made today.   Outpatient Medications Prior to Visit  Medication Sig Dispense Refill   Blood Glucose Monitoring Suppl (TRUE METRIX METER) w/Device KIT Use as instructed. Check blood glucose level by fingerstick 3-5 times per day. 1 kit 0   glucose blood test strip USE AS INSTRUCTED. CHECK BLOOD GLUCOSE LEVEL BY FINGERSTICK 3-5 TIMES PER DAY. 100 strip 12   glucose monitoring  kit (FREESTYLE) monitoring kit 1 each by Does not apply route 4 (four) times daily - after meals and at bedtime. 1 month Diabetic Testing Supplies for QAC-QHS accuchecks. 1 each 1   Insulin Pen Needle (PEN NEEDLES) 32G X 4 MM MISC Use as instructed. Inject into the skin 5 times per day 200 each 6   Insulin Pen Needle 32G X 4 MM MISC USE AS INSTRUCTED. INJECT INTO THE SKIN 5 TIMES PER DAY 200 each 6   TRUEplus Lancets 28G MISC USE AS INSTRUCTED. CHECK BLOOD GLUCOSE LEVEL BY FINGERSTICK 3-5 TIMES PER DAY. 200 each 3   aspirin EC 81 MG EC tablet Take 1 tablet (81 mg total) by mouth daily. Swallow whole. 90 tablet 0   atorvastatin (LIPITOR) 80 MG tablet Take 1 tablet (80 mg total) by mouth daily. 30 tablet 2   Dulaglutide 1.5 MG/0.5ML SOPN INJECT 1.5 MG INTO THE SKIN ONCE A WEEK. 6 mL 1   hydrOXYzine (VISTARIL) 25 MG capsule Take 1 capsule twice daily as needed for anxiety 60 capsule 1   insulin glargine (LANTUS) 100 UNIT/ML Solostar Pen Inject 40 Units into the skin daily. 12 mL 3   losartan (COZAAR) 25 MG tablet Take 1 tablet (25 mg total) by mouth daily. 30 tablet 2   metFORMIN (GLUCOPHAGE-XR) 500 MG 24 hr tablet TAKE 2 TABLETS (1,000 MG TOTAL) BY MOUTH 2 (TWO) TIMES DAILY. 120 tablet 2   sertraline (ZOLOFT) 50 MG tablet take 1 tablet by mouth daily 30 tablet 2   No facility-administered medications prior to visit.    Allergies  Allergen Reactions   Codeine Nausea And Vomiting       Objective:    BP 124/72   Pulse 91   Ht 6' (1.829 m)   Wt 289 lb 8 oz (131.3 kg)   SpO2 99%   BMI 39.26 kg/m  Wt Readings from Last 3 Encounters:  06/14/21 289 lb 8 oz (131.3 kg)  04/13/21 290 lb (131.5 kg)  03/31/21 275 lb (124.7 kg)    Physical Exam Vitals and nursing note reviewed.  Constitutional:      Appearance: He is well-developed.  HENT:     Head: Normocephalic and atraumatic.  Cardiovascular:     Rate and Rhythm: Normal rate and regular rhythm.     Heart sounds: Normal heart sounds.  No murmur heard.   No friction rub. No gallop.  Pulmonary:     Effort: Pulmonary effort is normal. No tachypnea or respiratory distress.     Breath sounds: Normal breath sounds. No decreased breath sounds, wheezing, rhonchi or rales.  Chest:     Chest wall: No tenderness.  Abdominal:     General: Bowel sounds are normal.     Palpations: Abdomen is soft.  Musculoskeletal:        General: Normal range of motion.     Cervical back: Normal range of motion.  Skin:    General: Skin is warm and dry.  Neurological:     Mental Status: He is alert and oriented to  person, place, and time.     Coordination: Coordination normal.  Psychiatric:        Behavior: Behavior normal. Behavior is cooperative.        Thought Content: Thought content normal.        Judgment: Judgment normal.         Patient has been counseled extensively about nutrition and exercise as well as the importance of adherence with medications and regular follow-up. The patient was given clear instructions to go to ER or return to medical center if symptoms don't improve, worsen or new problems develop. The patient verbalized understanding.   Follow-up: Return in 6 weeks (on 07/26/2021) for DM fasting labs.   Gildardo Pounds, FNP-BC [facility] and [facility], [place]   06/14/2021, 11:37 AM

## 2021-06-21 ENCOUNTER — Other Ambulatory Visit: Payer: Self-pay

## 2021-06-26 ENCOUNTER — Other Ambulatory Visit: Payer: Self-pay

## 2021-06-27 ENCOUNTER — Other Ambulatory Visit: Payer: Self-pay

## 2021-06-28 ENCOUNTER — Other Ambulatory Visit: Payer: Self-pay

## 2021-07-26 ENCOUNTER — Other Ambulatory Visit: Payer: Self-pay | Admitting: Nurse Practitioner

## 2021-07-26 ENCOUNTER — Other Ambulatory Visit: Payer: Self-pay

## 2021-07-26 DIAGNOSIS — E1165 Type 2 diabetes mellitus with hyperglycemia: Secondary | ICD-10-CM

## 2021-07-26 DIAGNOSIS — I10 Essential (primary) hypertension: Secondary | ICD-10-CM

## 2021-07-31 ENCOUNTER — Other Ambulatory Visit: Payer: Self-pay

## 2021-08-04 ENCOUNTER — Other Ambulatory Visit: Payer: Self-pay

## 2021-08-05 ENCOUNTER — Other Ambulatory Visit: Payer: Self-pay

## 2021-08-07 ENCOUNTER — Other Ambulatory Visit: Payer: Self-pay

## 2021-08-18 ENCOUNTER — Other Ambulatory Visit: Payer: Self-pay

## 2021-08-22 ENCOUNTER — Other Ambulatory Visit: Payer: Self-pay

## 2021-08-25 ENCOUNTER — Other Ambulatory Visit: Payer: Self-pay

## 2021-08-28 ENCOUNTER — Other Ambulatory Visit: Payer: Self-pay

## 2021-09-15 ENCOUNTER — Other Ambulatory Visit: Payer: Self-pay

## 2021-10-19 ENCOUNTER — Other Ambulatory Visit: Payer: Self-pay

## 2021-10-20 ENCOUNTER — Other Ambulatory Visit: Payer: Self-pay

## 2021-11-17 ENCOUNTER — Other Ambulatory Visit: Payer: Self-pay

## 2021-11-20 ENCOUNTER — Other Ambulatory Visit: Payer: Self-pay

## 2021-12-18 ENCOUNTER — Other Ambulatory Visit (HOSPITAL_COMMUNITY): Payer: Self-pay

## 2021-12-31 IMAGING — CR DG FOOT COMPLETE 3+V*R*
3 series · 3 of 3 positions shown · non-contrast
Comparison: None.

CLINICAL DATA: Right foot pain, injury, trauma

EXAM:
RIGHT FOOT COMPLETE - 3+ VIEW

[t foot ap right]
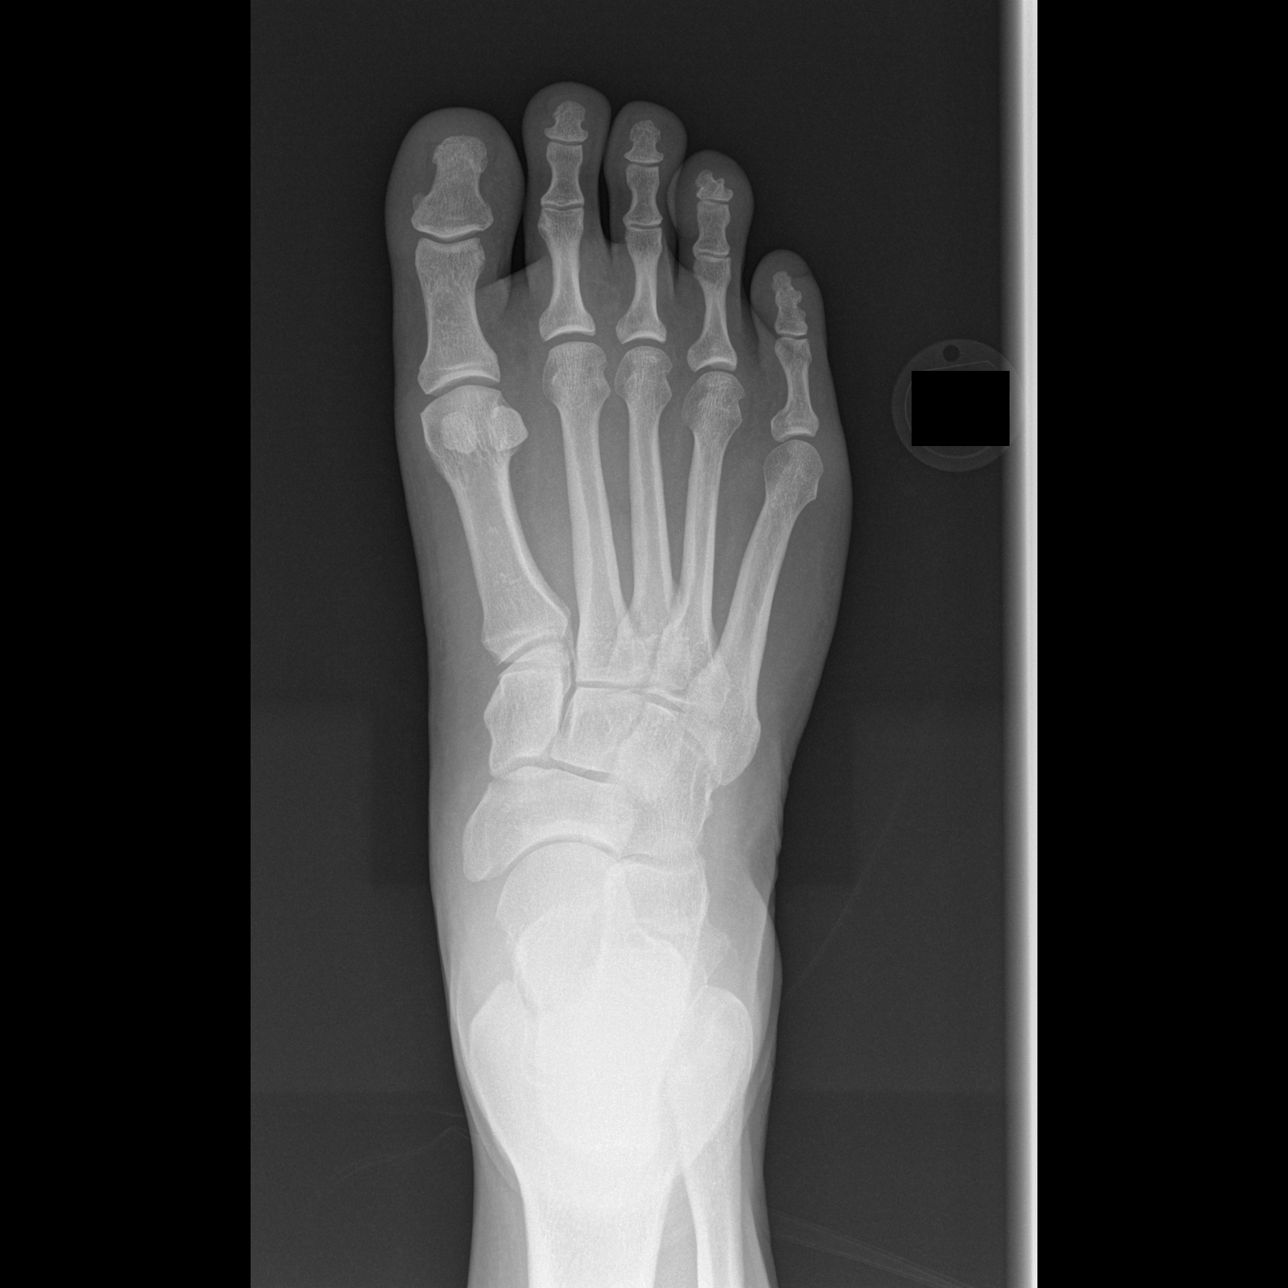

[t foot oblique right]
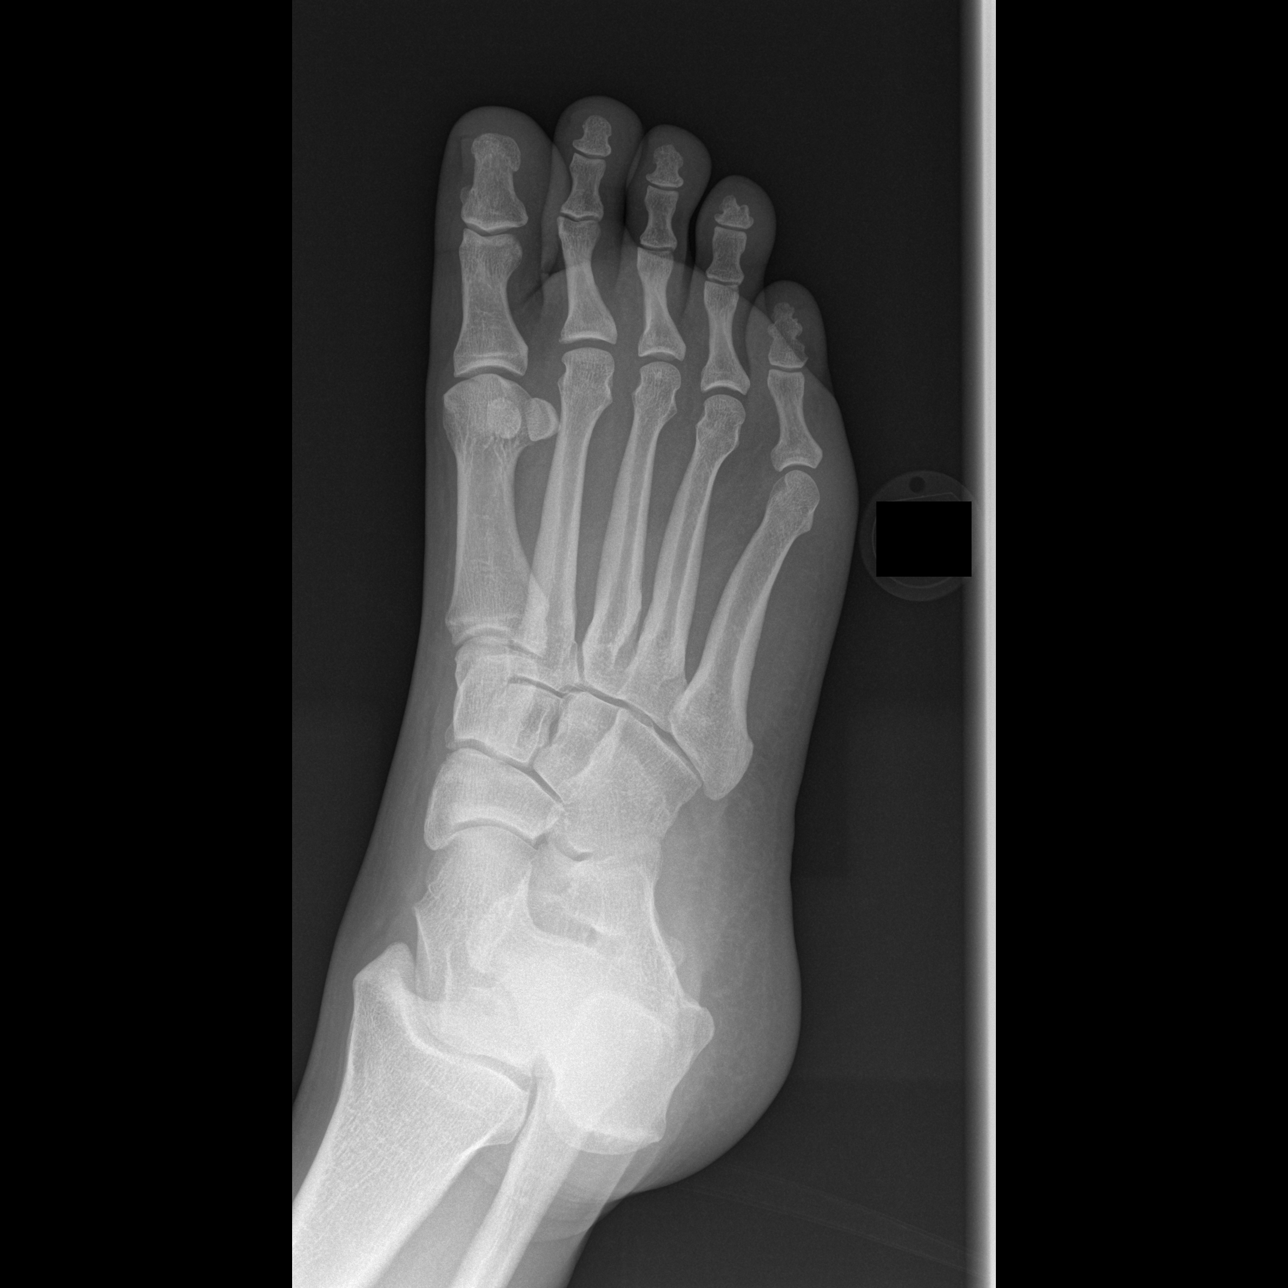

[t foot lat right]
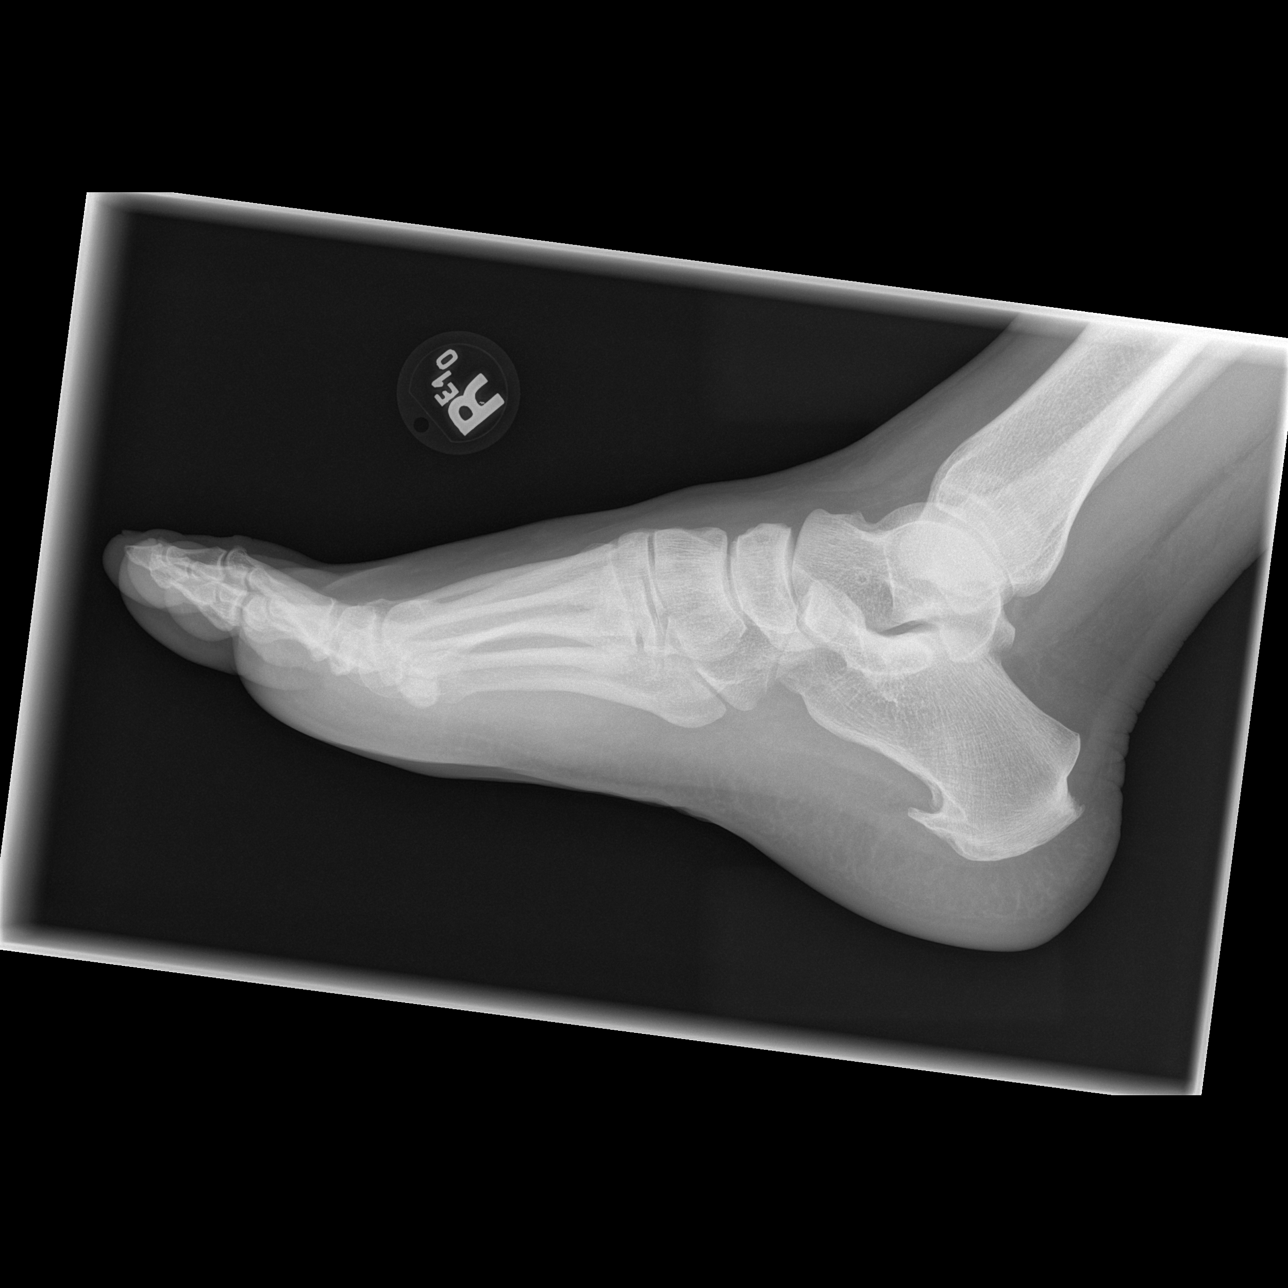

[3 of 3 positions shown; findings below may reference images not displayed]

FINDINGS: There is no evidence of fracture or dislocation. There is no
evidence of arthropathy or other focal bone abnormality. Soft
tissues are unremarkable. Small plantar calcaneal spur.
IMPRESSION: Negative.

## 2022-01-30 ENCOUNTER — Encounter (HOSPITAL_BASED_OUTPATIENT_CLINIC_OR_DEPARTMENT_OTHER): Payer: Self-pay | Admitting: Urology

## 2022-01-30 ENCOUNTER — Inpatient Hospital Stay (HOSPITAL_BASED_OUTPATIENT_CLINIC_OR_DEPARTMENT_OTHER)
Admission: EM | Admit: 2022-01-30 | Discharge: 2022-02-02 | DRG: 918 | Disposition: A | Discharge status: Home / Self Care | Payer: Self-pay | Attending: Family Medicine | Admitting: Family Medicine

## 2022-01-30 ENCOUNTER — Other Ambulatory Visit: Payer: Self-pay

## 2022-01-30 DIAGNOSIS — F1010 Alcohol abuse, uncomplicated: Secondary | ICD-10-CM | POA: Diagnosis present

## 2022-01-30 DIAGNOSIS — F419 Anxiety disorder, unspecified: Secondary | ICD-10-CM | POA: Diagnosis present

## 2022-01-30 DIAGNOSIS — T391X1S Poisoning by 4-Aminophenol derivatives, accidental (unintentional), sequela: Secondary | ICD-10-CM

## 2022-01-30 DIAGNOSIS — F319 Bipolar disorder, unspecified: Secondary | ICD-10-CM | POA: Diagnosis present

## 2022-01-30 DIAGNOSIS — Z833 Family history of diabetes mellitus: Secondary | ICD-10-CM

## 2022-01-30 DIAGNOSIS — Z91199 Patient's noncompliance with other medical treatment and regimen due to unspecified reason: Secondary | ICD-10-CM

## 2022-01-30 DIAGNOSIS — R7989 Other specified abnormal findings of blood chemistry: Secondary | ICD-10-CM | POA: Diagnosis present

## 2022-01-30 DIAGNOSIS — Z79899 Other long term (current) drug therapy: Secondary | ICD-10-CM

## 2022-01-30 DIAGNOSIS — Z91030 Bee allergy status: Secondary | ICD-10-CM

## 2022-01-30 DIAGNOSIS — Z885 Allergy status to narcotic agent status: Secondary | ICD-10-CM

## 2022-01-30 DIAGNOSIS — Z794 Long term (current) use of insulin: Secondary | ICD-10-CM

## 2022-01-30 DIAGNOSIS — Z6835 Body mass index (BMI) 35.0-35.9, adult: Secondary | ICD-10-CM

## 2022-01-30 DIAGNOSIS — Z8673 Personal history of transient ischemic attack (TIA), and cerebral infarction without residual deficits: Secondary | ICD-10-CM

## 2022-01-30 DIAGNOSIS — F1510 Other stimulant abuse, uncomplicated: Secondary | ICD-10-CM | POA: Diagnosis present

## 2022-01-30 DIAGNOSIS — F17210 Nicotine dependence, cigarettes, uncomplicated: Secondary | ICD-10-CM | POA: Diagnosis present

## 2022-01-30 DIAGNOSIS — Z7984 Long term (current) use of oral hypoglycemic drugs: Secondary | ICD-10-CM

## 2022-01-30 DIAGNOSIS — E785 Hyperlipidemia, unspecified: Secondary | ICD-10-CM | POA: Diagnosis present

## 2022-01-30 DIAGNOSIS — I160 Hypertensive urgency: Secondary | ICD-10-CM | POA: Diagnosis present

## 2022-01-30 DIAGNOSIS — I10 Essential (primary) hypertension: Secondary | ICD-10-CM | POA: Diagnosis present

## 2022-01-30 DIAGNOSIS — Z7985 Long-term (current) use of injectable non-insulin antidiabetic drugs: Secondary | ICD-10-CM

## 2022-01-30 DIAGNOSIS — T483X1A Poisoning by antitussives, accidental (unintentional), initial encounter: Principal | ICD-10-CM | POA: Diagnosis present

## 2022-01-30 DIAGNOSIS — E1165 Type 2 diabetes mellitus with hyperglycemia: Secondary | ICD-10-CM | POA: Diagnosis present

## 2022-01-30 DIAGNOSIS — N179 Acute kidney failure, unspecified: Secondary | ICD-10-CM | POA: Diagnosis present

## 2022-01-30 DIAGNOSIS — K760 Fatty (change of) liver, not elsewhere classified: Secondary | ICD-10-CM | POA: Diagnosis present

## 2022-01-30 LAB — BASIC METABOLIC PANEL
Anion gap: 10 (ref 5–15)
BUN: 13 mg/dL (ref 6–20)
CO2: 20 mmol/L — ABNORMAL LOW (ref 22–32)
Calcium: 8.7 mg/dL — ABNORMAL LOW (ref 8.9–10.3)
Chloride: 99 mmol/L (ref 98–111)
Creatinine, Ser: 1.52 mg/dL — ABNORMAL HIGH (ref 0.61–1.24)
GFR, Estimated: 59 mL/min — ABNORMAL LOW (ref >60)
Glucose, Bld: 610 mg/dL (ref 70–99)
Potassium: 5.9 mmol/L — ABNORMAL HIGH (ref 3.5–5.1)
Sodium: 129 mmol/L — ABNORMAL LOW (ref 135–145)

## 2022-01-30 LAB — CBC
HCT: 44.3 % (ref 39.0–52.0)
Hemoglobin: 16 g/dL (ref 13.0–17.0)
MCH: 30.5 pg (ref 26.0–34.0)
MCHC: 36.1 g/dL — ABNORMAL HIGH (ref 30.0–36.0)
MCV: 84.4 fL (ref 80.0–100.0)
Platelets: 262 10*3/uL (ref 150–400)
RBC: 5.25 MIL/uL (ref 4.22–5.81)
RDW: 12 % (ref 11.5–15.5)
WBC: 5.3 10*3/uL (ref 4.0–10.5)
nRBC: 0 % (ref 0.0–0.2)

## 2022-01-30 LAB — CBG MONITORING, ED: Glucose-Capillary: >600 mg/dL (ref 70–99)

## 2022-01-30 MED ORDER — LACTATED RINGERS IV BOLUS
20.0000 mL/kg | Freq: Once | INTRAVENOUS | Status: AC
  Administered 2022-01-31: 2314 mL via INTRAVENOUS

## 2022-01-30 MED ORDER — DEXTROSE 50 % IV SOLN: 0.0000 mL | INTRAVENOUS | Status: DC | PRN

## 2022-01-30 MED ORDER — DEXTROSE IN LACTATED RINGERS 5 % IV SOLN
INTRAVENOUS | Status: DC
Start: 2022-01-30 — End: 2022-01-31

## 2022-01-30 MED ORDER — LACTATED RINGERS IV SOLN
INTRAVENOUS | Status: DC
Start: 2022-01-30 — End: 2022-01-31

## 2022-01-30 MED ORDER — INSULIN REGULAR(HUMAN) IN NACL 100-0.9 UT/100ML-% IV SOLN
INTRAVENOUS | Status: DC
  Administered 2022-01-31: 15 [IU]/h via INTRAVENOUS
  Filled 2022-01-30: qty 100

## 2022-01-30 NOTE — ED Notes (Signed)
Poison control called. All recommended lab work is pending. Pt states he took Coricidin without Tylenol, and that level is currently pending. Per Poison Control, monitor 8hrs post-ingestion, and be on alert for hyperkalemia and ventricular arrythmias, prolonged QTi, and recommend IV fluids.

## 2022-01-30 NOTE — ED Triage Notes (Signed)
Pt type 2 DM States "had fruity taste in mouth"  CBG read High >600 Reports dry mouth and blurry vision   Took Triple C's today, took 3 boxes this morning  CBG HI now  Provider notified

## 2022-01-31 ENCOUNTER — Encounter (HOSPITAL_BASED_OUTPATIENT_CLINIC_OR_DEPARTMENT_OTHER): Payer: Self-pay | Admitting: Emergency Medicine

## 2022-01-31 ENCOUNTER — Observation Stay (HOSPITAL_COMMUNITY): Payer: Self-pay

## 2022-01-31 DIAGNOSIS — T483X1A Poisoning by antitussives, accidental (unintentional), initial encounter: Secondary | ICD-10-CM | POA: Diagnosis present

## 2022-01-31 DIAGNOSIS — T483X2A Poisoning by antitussives, intentional self-harm, initial encounter: Secondary | ICD-10-CM

## 2022-01-31 LAB — HEPATIC FUNCTION PANEL
ALT: 567 U/L — ABNORMAL HIGH (ref 0–44)
AST: 494 U/L — ABNORMAL HIGH (ref 15–41)
Albumin: 3.4 g/dL — ABNORMAL LOW (ref 3.5–5.0)
Alkaline Phosphatase: 101 U/L (ref 38–126)
Bilirubin, Direct: <0.1 mg/dL (ref 0.0–0.2)
Total Bilirubin: 0.5 mg/dL (ref 0.3–1.2)
Total Protein: 6.7 g/dL (ref 6.5–8.1)

## 2022-01-31 LAB — CBC WITH DIFFERENTIAL/PLATELET
Abs Immature Granulocytes: 0.02 10*3/uL (ref 0.00–0.07)
Basophils Absolute: 0.1 10*3/uL (ref 0.0–0.1)
Basophils Relative: 1 %
Eosinophils Absolute: 0.4 10*3/uL (ref 0.0–0.5)
Eosinophils Relative: 6 %
HCT: 41.1 % (ref 39.0–52.0)
Hemoglobin: 14.8 g/dL (ref 13.0–17.0)
Immature Granulocytes: 0 %
Lymphocytes Relative: 31 %
Lymphs Abs: 2.1 10*3/uL (ref 0.7–4.0)
MCH: 30.3 pg (ref 26.0–34.0)
MCHC: 36 g/dL (ref 30.0–36.0)
MCV: 84 fL (ref 80.0–100.0)
Monocytes Absolute: 0.6 10*3/uL (ref 0.1–1.0)
Monocytes Relative: 9 %
Neutro Abs: 3.5 10*3/uL (ref 1.7–7.7)
Neutrophils Relative %: 53 %
Platelets: 247 10*3/uL (ref 150–400)
RBC: 4.89 MIL/uL (ref 4.22–5.81)
RDW: 12.1 % (ref 11.5–15.5)
WBC: 6.6 10*3/uL (ref 4.0–10.5)
nRBC: 0 % (ref 0.0–0.2)

## 2022-01-31 LAB — URINALYSIS, MICROSCOPIC (REFLEX)

## 2022-01-31 LAB — HEMOGLOBIN A1C
Hgb A1c MFr Bld: 12.9 % — ABNORMAL HIGH (ref 4.8–5.6)
Mean Plasma Glucose: 323.53 mg/dL

## 2022-01-31 LAB — COMPREHENSIVE METABOLIC PANEL
ALT: 588 U/L — ABNORMAL HIGH (ref 0–44)
AST: 366 U/L — ABNORMAL HIGH (ref 15–41)
Albumin: 3.1 g/dL — ABNORMAL LOW (ref 3.5–5.0)
Alkaline Phosphatase: 84 U/L (ref 38–126)
Anion gap: 8 (ref 5–15)
BUN: 9 mg/dL (ref 6–20)
CO2: 21 mmol/L — ABNORMAL LOW (ref 22–32)
Calcium: 8.3 mg/dL — ABNORMAL LOW (ref 8.9–10.3)
Chloride: 103 mmol/L (ref 98–111)
Creatinine, Ser: 0.58 mg/dL — ABNORMAL LOW (ref 0.61–1.24)
GFR, Estimated: >60 mL/min (ref >60)
Glucose, Bld: 291 mg/dL — ABNORMAL HIGH (ref 70–99)
Potassium: 4 mmol/L (ref 3.5–5.1)
Sodium: 132 mmol/L — ABNORMAL LOW (ref 135–145)
Total Bilirubin: 1 mg/dL (ref 0.3–1.2)
Total Protein: 6.2 g/dL — ABNORMAL LOW (ref 6.5–8.1)

## 2022-01-31 LAB — URINALYSIS, ROUTINE W REFLEX MICROSCOPIC
Bilirubin Urine: NEGATIVE
Glucose, UA: >=500 mg/dL — AB
Hgb urine dipstick: NEGATIVE
Ketones, ur: NEGATIVE mg/dL
Leukocytes,Ua: NEGATIVE
Nitrite: NEGATIVE
Protein, ur: NEGATIVE mg/dL
Specific Gravity, Urine: 1.01 (ref 1.005–1.030)
pH: 7 (ref 5.0–8.0)

## 2022-01-31 LAB — GLUCOSE, CAPILLARY
Glucose-Capillary: 256 mg/dL — ABNORMAL HIGH (ref 70–99)
Glucose-Capillary: 289 mg/dL — ABNORMAL HIGH (ref 70–99)
Glucose-Capillary: 250 mg/dL — ABNORMAL HIGH (ref 70–99)
Glucose-Capillary: 221 mg/dL — ABNORMAL HIGH (ref 70–99)
Glucose-Capillary: 244 mg/dL — ABNORMAL HIGH (ref 70–99)

## 2022-01-31 LAB — PROTIME-INR
INR: 0.9 (ref 0.8–1.2)
Prothrombin Time: 11.7 seconds (ref 11.4–15.2)

## 2022-01-31 LAB — SALICYLATE LEVEL: Salicylate Lvl: <7 mg/dL — ABNORMAL LOW (ref 7.0–30.0)

## 2022-01-31 LAB — ETHANOL: Alcohol, Ethyl (B): <10 mg/dL (ref <10)

## 2022-01-31 LAB — RAPID URINE DRUG SCREEN, HOSP PERFORMED
Amphetamines: NOT DETECTED
Barbiturates: NOT DETECTED
Benzodiazepines: NOT DETECTED
Cocaine: NOT DETECTED
Opiates: NOT DETECTED
Tetrahydrocannabinol: NOT DETECTED

## 2022-01-31 LAB — I-STAT VENOUS BLOOD GAS, ED
Acid-Base Excess: 0 mmol/L (ref 0.0–2.0)
Bicarbonate: 24.8 mmol/L (ref 20.0–28.0)
Calcium, Ion: 1.24 mmol/L (ref 1.15–1.40)
HCT: 41 % (ref 39.0–52.0)
Hemoglobin: 13.9 g/dL (ref 13.0–17.0)
O2 Saturation: 81 %
Patient temperature: 98.8
Potassium: 4.6 mmol/L (ref 3.5–5.1)
Sodium: 131 mmol/L — ABNORMAL LOW (ref 135–145)
TCO2: 26 mmol/L (ref 22–32)
pCO2, Ven: 40.8 mmHg — ABNORMAL LOW (ref 44–60)
pH, Ven: 7.393 (ref 7.25–7.43)
pO2, Ven: 46 mmHg — ABNORMAL HIGH (ref 32–45)

## 2022-01-31 LAB — POTASSIUM: Potassium: 4.7 mmol/L (ref 3.5–5.1)

## 2022-01-31 LAB — MAGNESIUM: Magnesium: 1.8 mg/dL (ref 1.7–2.4)

## 2022-01-31 LAB — ACETAMINOPHEN LEVEL: Acetaminophen (Tylenol), Serum: <10 ug/mL — ABNORMAL LOW (ref 10–30)

## 2022-01-31 LAB — BETA-HYDROXYBUTYRIC ACID: Beta-Hydroxybutyric Acid: 0.2 mmol/L (ref 0.05–0.27)

## 2022-01-31 LAB — MRSA NEXT GEN BY PCR, NASAL: MRSA by PCR Next Gen: NOT DETECTED

## 2022-01-31 LAB — PHOSPHORUS: Phosphorus: 2.3 mg/dL — ABNORMAL LOW (ref 2.5–4.6)

## 2022-01-31 LAB — CK: Total CK: 75 U/L (ref 49–397)

## 2022-01-31 LAB — CBG MONITORING, ED
Glucose-Capillary: 338 mg/dL — ABNORMAL HIGH (ref 70–99)
Glucose-Capillary: 454 mg/dL — ABNORMAL HIGH (ref 70–99)

## 2022-01-31 MED ORDER — OXYCODONE HCL 5 MG PO TABS
5.0000 mg | ORAL_TABLET | Freq: Four times a day (QID) | ORAL | Status: DC | PRN
  Administered 2022-01-31 (×2): 5 mg via ORAL
  Filled 2022-01-31 (×2): qty 1

## 2022-01-31 MED ORDER — SODIUM CHLORIDE 0.9 % IV SOLN: INTRAVENOUS | Status: AC

## 2022-01-31 MED ORDER — INSULIN GLARGINE-YFGN 100 UNIT/ML ~~LOC~~ SOLN
10.0000 [IU] | Freq: Two times a day (BID) | SUBCUTANEOUS | Status: DC
  Administered 2022-01-31 – 2022-02-02 (×5): 10 [IU] via SUBCUTANEOUS
  Filled 2022-01-31 (×5): qty 0.1

## 2022-01-31 MED ORDER — CHLORHEXIDINE GLUCONATE CLOTH 2 % EX PADS
6.0000 | MEDICATED_PAD | Freq: Every day | CUTANEOUS | Status: DC
  Administered 2022-02-01: 6 via TOPICAL
  Filled 2022-01-31: qty 6

## 2022-01-31 MED ORDER — NICOTINE 21 MG/24HR TD PT24
21.0000 mg | MEDICATED_PATCH | Freq: Every day | TRANSDERMAL | Status: DC
Start: 2022-01-31 — End: 2022-02-02
  Administered 2022-01-31 – 2022-02-01 (×2): 21 mg via TRANSDERMAL
  Filled 2022-01-31 (×3): qty 1

## 2022-01-31 MED ORDER — CLONIDINE HCL 0.1 MG PO TABS
0.2000 mg | ORAL_TABLET | Freq: Two times a day (BID) | ORAL | Status: DC
  Administered 2022-01-31 – 2022-02-02 (×5): 0.2 mg via ORAL
  Filled 2022-01-31 (×6): qty 2

## 2022-01-31 MED ORDER — POLYETHYLENE GLYCOL 3350 17 G PO PACK: 17.0000 g | PACK | Freq: Every day | ORAL | Status: DC | PRN

## 2022-01-31 MED ORDER — ONDANSETRON HCL 4 MG/2ML IJ SOLN
4.0000 mg | Freq: Four times a day (QID) | INTRAMUSCULAR | Status: DC | PRN
Start: 2022-01-31 — End: 2022-02-02

## 2022-01-31 MED ORDER — ORAL CARE MOUTH RINSE
15.0000 mL | OROMUCOSAL | Status: DC | PRN
  Filled 2022-01-31: qty 15

## 2022-01-31 MED ORDER — METFORMIN HCL ER 500 MG PO TB24
1000.0000 mg | ORAL_TABLET | Freq: Two times a day (BID) | ORAL | Status: DC
  Administered 2022-01-31 – 2022-02-02 (×5): 1000 mg via ORAL
  Filled 2022-01-31 (×5): qty 2

## 2022-01-31 MED ORDER — ENOXAPARIN SODIUM 40 MG/0.4ML IJ SOSY
40.0000 mg | PREFILLED_SYRINGE | INTRAMUSCULAR | Status: DC
  Administered 2022-01-31 – 2022-02-01 (×2): 40 mg via SUBCUTANEOUS
  Filled 2022-01-31 (×2): qty 0.4

## 2022-01-31 MED ORDER — AMLODIPINE BESYLATE 5 MG PO TABS
5.0000 mg | ORAL_TABLET | ORAL | Status: AC
Start: 2022-01-31 — End: 2022-01-31
  Administered 2022-01-31: 5 mg via ORAL
  Filled 2022-01-31: qty 1

## 2022-01-31 MED ORDER — HYDRALAZINE HCL 20 MG/ML IJ SOLN
5.0000 mg | Freq: Four times a day (QID) | INTRAMUSCULAR | Status: DC | PRN
  Administered 2022-01-31: 5 mg via INTRAVENOUS
  Filled 2022-01-31: qty 1

## 2022-01-31 MED ORDER — INSULIN ASPART 100 UNIT/ML IJ SOLN
0.0000 [IU] | Freq: Three times a day (TID) | INTRAMUSCULAR | Status: DC
  Administered 2022-01-31: 11 [IU] via SUBCUTANEOUS
  Administered 2022-01-31 – 2022-02-01 (×2): 7 [IU] via SUBCUTANEOUS
  Administered 2022-02-01: 11 [IU] via SUBCUTANEOUS
  Administered 2022-02-01 – 2022-02-02 (×2): 7 [IU] via SUBCUTANEOUS

## 2022-01-31 MED ORDER — MELATONIN 3 MG PO TABS
3.0000 mg | ORAL_TABLET | Freq: Every evening | ORAL | Status: DC | PRN
Start: 2022-01-31 — End: 2022-02-02
  Administered 2022-01-31 – 2022-02-01 (×2): 3 mg via ORAL
  Filled 2022-01-31 (×2): qty 1

## 2022-01-31 MED ORDER — ACETYLCYSTEINE 200 MG/ML IV SOLN
INTRAVENOUS | Status: AC
  Filled 2022-01-31: qty 180

## 2022-01-31 MED ORDER — ACETYLCYSTEINE 200 MG/ML IV SOLN
1500.0000 mg/h | INTRAVENOUS | Status: AC
  Administered 2022-01-31 – 2022-02-01 (×4): 1500 mg/h via INTRAVENOUS
  Filled 2022-01-31 (×6): qty 90

## 2022-01-31 MED ORDER — INSULIN ASPART 100 UNIT/ML IJ SOLN
0.0000 [IU] | Freq: Every day | INTRAMUSCULAR | Status: DC
  Administered 2022-01-31 – 2022-02-01 (×2): 2 [IU] via SUBCUTANEOUS

## 2022-01-31 MED ORDER — ACETYLCYSTEINE LOAD VIA INFUSION
15000.0000 mg | Freq: Once | INTRAVENOUS | Status: AC
  Administered 2022-01-31: 15000 mg via INTRAVENOUS
  Filled 2022-01-31: qty 492

## 2022-01-31 MED ORDER — AMLODIPINE BESYLATE 5 MG PO TABS
5.0000 mg | ORAL_TABLET | Freq: Every day | ORAL | Status: DC
  Administered 2022-01-31 – 2022-02-02 (×3): 5 mg via ORAL
  Filled 2022-01-31 (×3): qty 1

## 2022-01-31 NOTE — Progress Notes (Signed)
Patient arrived from [facility]. Paged admission center in amion.

## 2022-01-31 NOTE — Progress Notes (Signed)
Plan of Care Note for accepted transfer   Patient: Jason Wolf MRN: [id number]   DOA: 01/30/2022  Facility requesting transfer: [facility] ED Requesting Provider: Dr. Nicanor Alcon Reason for transfer: Dextromethorphan Overdose (unintentional) with hyperglycemia and possible Acetaminophen overdose Facility course:   41 year old male with past medical history of bipolar disorder, hypertension, hyperlipidemia, polysubstance abuse (IV Methampheamines, EtOH), non-insulin-dependent diabetes mellitus type 2 who presents to med [facility] emergency department due to complaints of a "fruity taste in my mouth."  Upon evaluation in the emergency department patient admitted to regularly getting high off of dextromethorphan.  Patient reports consuming at least 3 boxes of dextromethorphan daily.  Upon evaluation in the emergency department patient was found to have abnormal hepatic function panel including AST of 494 and ALT of 567.  Patient was also found to be quite hyperglycemic with initial blood sugar of greater than 600 without anion gap.  Furthermore, patient was found to be in mild acute kidney injury with creatinine of 1.52.  ER provider, concerned about the degree of hyperglycemia initiated patient on a insulin infusion.  No evidence of DKA based on initial labs.  ER provider was also concerned that the patient could unintentionally be taking dextromethorphan mixed with acetaminophen.  Acetaminophen level here in the emergency department is normal.  Patient denies taking acetaminophen containing formulations.  That being said, due to EDP concerns she initiated N-acetylcysteine infusion.  EDP additionally discussed her concerns with poison control.  They recommended obtaining a creatine kinase, intravenous hydration, close clinical monitoring with serial acetaminophen levels/hepatic function panels.    ER provider requesting hospitalization for continued management of dextromethorphan toxicity, severe  hyperglycemia and suspected acetaminophen overdose.  Plan of care: The patient is accepted for admission to [facility] unit, at [facility]..    Author: Marinda Elk, MD 01/31/2022  Check [email or web address] for on-call coverage.  Nursing staff, Please call [facility] Admits & Consults System-Wide number on Amion as soon as patient's arrival, so appropriate admitting provider can evaluate the pt.

## 2022-01-31 NOTE — H&P (Addendum)
History and Physical  Ryelan Wolf HQI:[id number] DOB: [birth year] DOA: 01/30/2022  Referring physician: Accepted by Dr. Cyd Silence, [facility], hospitalist service. PCP: Gildardo Pounds, NP  Outpatient Specialists: None. Patient coming from: Home through [facility] ED  Chief Complaint: Unintentional overdose of dextromethorphan  HPI: Jason Wolf is a 41 y.o. male with medical history significant for type 2 diabetes, essential hypertension, hyperlipidemia, morbid obesity, chronic anxiety/depression, who initially presented to [facility] ED due to fruity taste in his mouth, dry mouth, blurry vision, and severe hyperglycemia with CBG greater than 600.  States he ingested 3 boxes of dextromethorphan to give himself a high.  Denies any suicidal ideation or intentional harm to himself.  States he has been dealing with a lot of personal stressors.  Has been using dextromethorphan often since March to give him a high and usually without significant undesirable side effects.  States the side effects he had this time were anything he had ever experienced.  He presented to the ED for further evaluation.    Poison control was called in the ED and recommended IV fluid, to monitor 8 hours postingestion and be on alert for hyperkalemia, ventricular arrhythmias, and prolonged Qtc.  Due to hyperglycemia, the patient was started on insulin drip in the ED.  EDP requested admission.  The patient was accepted by the hospitalist service, Dr. Cyd Silence, [facility], and transferred to [facility] long hospital stepdown unit as observation status.  At the time of this visit, the patient is alert and oriented x3.  Denies abdominal pain or nausea.  However, has some tenderness with moderate palpation of right upper quadrant of his abdomen.  ED Course: Tmax 98.8.  BP 176/100, pulse 88, respiratory 20, saturation 96% on room air.  Lab studies significant for serum sodium 131, potassium 4.6, serum bicarb 20, glucose 16.  Creatinine 1.52 with baseline creatinine  of 0.72.  AST 494, ALT 567, T. bili Ruben 0.5.  CPK 75.  Review of Systems: Review of systems as noted in the HPI. All other systems reviewed and are negative.   Past Medical History:  Diagnosis Date   Bipolar disorder ([place])    Diabetes mellitus without complication ([place])    ETOH abuse    Hypertension    Opiate abuse, episodic ([place])    Past Surgical History:  Procedure Laterality Date   I & D EXTREMITY Right 02/20/2020   Procedure: IRRIGATION AND DEBRIDEMENT HAND;  Surgeon: Verner Mould, MD;  Location: [place];  Service: Orthopedics;  Laterality: Right;   KNEE ARTHROSCOPY      Social History:  reports that he has been smoking cigarettes. He has been smoking an average of 1 pack per day. He has never used smokeless tobacco. He reports current drug use. Drug: Methamphetamines. He reports that he does not drink alcohol.   Allergies  Allergen Reactions   Codeine Nausea And Vomiting    Family History  Problem Relation Age of Onset   Diabetes type II Mother    Diabetes Mother       Prior to Admission medications   Medication Sig Start Date End Date Taking? Authorizing Provider  atorvastatin (LIPITOR) 80 MG tablet Take 1 tablet (80 mg total) by mouth daily. 06/14/21 09/12/21  Gildardo Pounds, NP  Blood Glucose Monitoring Suppl (TRUE METRIX METER) w/Device KIT Use as instructed. Check blood glucose level by fingerstick 3-5 times per day. 06/29/20   Gildardo Pounds, NP  Dulaglutide 1.5 MG/0.5ML SOPN INJECT 1.5 MG INTO THE SKIN ONCE A  WEEK. 06/14/21 06/14/22  Gildardo Pounds, NP  glucose monitoring kit (FREESTYLE) monitoring kit 1 each by Does not apply route 4 (four) times daily - after meals and at bedtime. 1 month Diabetic Testing Supplies for QAC-QHS accuchecks. 02/23/20   Ghimire, Henreitta Leber, MD  hydrOXYzine (VISTARIL) 25 MG capsule Take 1 capsule twice daily as needed for anxiety 06/14/21   Gildardo Pounds, NP  insulin glargine (LANTUS) 100 UNIT/ML Solostar Pen Inject 40  Units into the skin daily. 06/14/21 06/14/22  Gildardo Pounds, NP  Insulin Pen Needle (PEN NEEDLES) 32G X 4 MM MISC Use as instructed. Inject into the skin 5 times per day 06/29/20   Gildardo Pounds, NP  losartan (COZAAR) 25 MG tablet Take 1 tablet (25 mg total) by mouth daily. 06/14/21 09/12/21  Gildardo Pounds, NP  metFORMIN (GLUCOPHAGE-XR) 500 MG 24 hr tablet TAKE 2 TABLETS (1,000 MG TOTAL) BY MOUTH 2 (TWO) TIMES DAILY. 06/14/21 06/14/22  Gildardo Pounds, NP  sertraline (ZOLOFT) 50 MG tablet Take 1 tablet by mouth daily 06/14/21   Gildardo Pounds, NP    Physical Exam: BP (!) 190/97 (BP Location: Left Arm)   Pulse 81   Temp 98.3 F (36.8 C) (Oral)   Resp (!) 27   Ht 5' 11"  (1.803 m)   Wt 115.7 kg   SpO2 95%   BMI 35.57 kg/m   General: 41 y.o. year-old male well developed well nourished in no acute distress.  Alert and oriented x3. Cardiovascular: Regular rate and rhythm with no rubs or gallops.  No thyromegaly or JVD noted.  No lower extremity edema. 2/4 pulses in all 4 extremities. Respiratory: Clear to auscultation with no wheezes or rales. Good inspiratory effort. Abdomen: Soft right upper quadrant abdominal tenderness with palpation.  Nondistended with normal bowel sounds x4 quadrants. Muskuloskeletal: No cyanosis, clubbing or edema noted bilaterally Neuro: CN II-XII intact, strength, sensation, reflexes Skin: No ulcerative lesions noted or rashes Psychiatry: Judgement and insight appear normal. Mood is appropriate for condition and setting          Labs on Admission:  Basic Metabolic Panel: Recent Labs  Lab 01/30/22 2307 01/30/22 2353 01/31/22 0008  NA 129*  --  131*  K 5.9* 4.7 4.6  CL 99  --   --   CO2 20*  --   --   GLUCOSE 610*  --   --   BUN 13  --   --   CREATININE 1.52*  --   --   CALCIUM 8.7*  --   --    Liver Function Tests: Recent Labs  Lab 01/30/22 2353  AST 494*  ALT 567*  ALKPHOS 101  BILITOT 0.5  PROT 6.7  ALBUMIN 3.4*   No results for  input(s): "LIPASE", "AMYLASE" in the last 168 hours. No results for input(s): "AMMONIA" in the last 168 hours. CBC: Recent Labs  Lab 01/30/22 2307 01/31/22 0008  WBC 5.3  --   HGB 16.0 13.9  HCT 44.3 41.0  MCV 84.4  --   PLT 262  --    Cardiac Enzymes: Recent Labs  Lab 01/30/22 2353  CKTOTAL 75    BNP (last 3 results) No results for input(s): "BNP" in the last 8760 hours.  ProBNP (last 3 results) No results for input(s): "PROBNP" in the last 8760 hours.  CBG: Recent Labs  Lab 01/30/22 2301 01/31/22 0047 01/31/22 0138 01/31/22 0252  GLUCAP >600* 454* 338* 256*    Radiological Exams  on Admission: No results found.  EKG: I independently viewed the EKG done and my findings are as followed: Sinus rhythm rate of 89.  Nonspecific ST-T changes.  QTc 441.  Assessment/Plan Present on Admission:  Dextromethorphan overdose  Principal Problem:   Dextromethorphan overdose  Unintentional overdose of dextromethorphan Has been getting high on dextromethorphan since he was 41 years old Has been using often since March 2023 due to personal stressors Denies suicidal ideation or intentional self-harm Repeat twelve-lead EKG Closely monitor  Elevated liver chemistries Elevated AST and ALT, alkaline phosphatase, INR, and T bilirubin normal. Abdominal tenderness with moderate palpation Obtain right upper quadrant abdominal ultrasound Avoid hepatotoxic agents. Repeat chemistry panel in the morning  AKI Baseline creatinine appears to be 0.72 with GFR greater than 60 Presented with creatinine of 1.52 with GFR 59 Hold off home losartan due to AKI Continue gentle IV fluid hydration Avoid nephrotoxic agents, dehydration and hypotension Monitor urine output Repeat renal panel in the morning.  Type 2 diabetes with hyperglycemia Was started on insulin drip in the ED, DC'd after improvement of his hyperglycemia Semglee 10 units twice daily Insulin sliding scale Obtain  hemoglobin A1c  Essential hypertension, BP is not at goal, elevated Home losartan on hold due to AKI Started Norvasc Continue to monitor vital signs IV antihypertensives as needed with parameters  Morbid obesity BMI 35 Recommend weight loss outpatient with regular physical activity and healthy dieting.  Chronic anxiety/depression Hold off home Zoloft as it can worsen transaminitis   DVT prophylaxis: Subcu Lovenox daily  Code Status: Full code  Family Communication: None at bedside  Disposition Plan: Accepted and admitted from [facility] ED to stepdown unit by Dr. Cyd Silence, [facility], hospitalist service.  Consults called: Poison control was called in the ED  Admission status: Observation status   Status is: Observation    Kayleen Memos MD [facility] Pager [phone]  If 7PM-7AM, please contact night-coverage [email or web address] Password [email or web address]  01/31/2022, 3:41 AM

## 2022-01-31 NOTE — Progress Notes (Signed)
Patient seen and examined and discussed plan of care    41 year old male bipolar HTN prior polysubstance abuse with IV drug abuse NIDDM Prior hospitalization 03/2021 for right lacunar TIA Prior group B strep right arm/forearm infection  Patient intentionally consuming 3 boxes dextromethorphan daily to get "high"  [facility] with fruity taste in mouth dry mouth blurred vision and CBG >600  Sodium 129 potassium 5.9 CBG 610 BUNs/creatinine 10/0.7-->13/1.5 AST/ALT 494/567 CK total 75 WBC five 5.3 hemoglobin 16.0 platelet 262  S: No distress no pain no fever  O/e BP (!) 190/101   Pulse 88   Temp 98 F (36.7 C) (Oral)   Resp 20   Ht 5\' 11"  (1.803 m)   Wt 115.7 kg   SpO2 96%   BMI 35.57 kg/m  EOMI NCAT no focal deficit Chest clear no rales rhonchi are absent Abdomen is soft no rebound-abdomen is obese No lower extremity edema Psych euthymic coherent  P Pressures are very elevated start clonidine 0.2 Follow-up poison control recommendations get CK daily Hypoglycemia seems to have completely resolved--- adding back metformin 1000 twice daily In the morning we will graduate him back to higher dose of Lantus 40 if he needs it he has a Dexcom at home and he can take his dulaglutide in addition on discharge  We will hold his sertraline until his CK comes back low  Anticipate he can probably discharge tomorrow  , MD [facility] 10:34 AM

## 2022-01-31 NOTE — Progress Notes (Signed)
Inpatient Diabetes Program Recommendations  AACE/ADA: New Consensus Statement on Inpatient Glycemic Control (2015)  Target Ranges:  Prepandial:   less than 140 mg/dL      Peak postprandial:   less than 180 mg/dL (1-2 hours)      Critically ill patients:  140 - 180 mg/dL   Lab Results  Component Value Date   GLUCAP 250 (H) 01/31/2022   HGBA1C 12.9 (H) 01/31/2022    Review of Glycemic Control  Diabetes history: DM2 Outpatient Diabetes medications: Trulicity 1.5 mg weekly, Lantus 40 QD, metformin 1000 mg BID Current orders for Inpatient glycemic control: Semglee 10 BID, Novolog 0-20 units TID with meals and 0-5 HS, metformin 1000 mg BID  HgbA1C - 12.9%  Inpatient Diabetes Program Recommendations:    Add Novolog 4 units TID with meals if eating > 50%  Increase Semglee to 15 units BID  Spoke with pt at bedside to verify home meds and discuss HgbA1C of 12.9%. Pt states he hasn't taken his insulin for approx 2 weeks. Metformin -XR is better tolerated than regular metformin. Does not check blood sugars on a regular basis. Said he's been lazy and does not take care of himself. Stressed importance of lifestyle modification with lower CHO diet, exercise and stress management. Discussed hypoglycemia s/s and treatment and goals of both HgbA1C and blood sugar goals. Answered questions. Talked about how our bodies do not like hyperglycemia and blood sugar control is important in reducing risk of long and short-term complications.   Will follow glucose trends while inpatient.  Encouraged pt to f/u with [facility] after discharge.   Thank you. Ailene Ards, RD, LDN, CDE Inpatient Diabetes Coordinator [phone]

## 2022-01-31 NOTE — ED Provider Notes (Signed)
[facility] EMERGENCY DEPARTMENT Provider Note   CSN: [id number] Arrival date & time: 01/30/22  2253     History  No chief complaint on file.   Jason Wolf is a 41 y.o. male.  The history is provided by the patient and a parent.  Illness Location:  At home Quality:  Fruity taste, off diabetes medication for 1 week.  Did eat BBQ pork and beans and had sherbet today.  Not adhering to diabetic diet.  Also did 3 boxes of coricidin HBP to "robotrip" but this is not unusual for him.  States he used the purple box Severity:  Moderate Onset quality:  Gradual Duration:  1 week Timing:  Constant Progression:  Unchanged Chronicity:  Recurrent Context:  Diabetic on metformin and trulicity Relieved by:  Nothing Worsened by:  Nothing Ineffective treatments:  None tried Associated symptoms: no diarrhea, no fever, no rash, no rhinorrhea, no vomiting and no wheezing   Risk factors:  Diabetes and h/o substance abuse      Home Medications Prior to Admission medications   Medication Sig Start Date End Date Taking? Authorizing Provider  atorvastatin (LIPITOR) 80 MG tablet Take 1 tablet (80 mg total) by mouth daily. 06/14/21 09/12/21  Gildardo Pounds, NP  Blood Glucose Monitoring Suppl (TRUE METRIX METER) w/Device KIT Use as instructed. Check blood glucose level by fingerstick 3-5 times per day. 06/29/20   Gildardo Pounds, NP  Dulaglutide 1.5 MG/0.5ML SOPN INJECT 1.5 MG INTO THE SKIN ONCE A WEEK. 06/14/21 06/14/22  Gildardo Pounds, NP  glucose monitoring kit (FREESTYLE) monitoring kit 1 each by Does not apply route 4 (four) times daily - after meals and at bedtime. 1 month Diabetic Testing Supplies for QAC-QHS accuchecks. 02/23/20   Ghimire, Henreitta Leber, MD  hydrOXYzine (VISTARIL) 25 MG capsule Take 1 capsule twice daily as needed for anxiety 06/14/21   Gildardo Pounds, NP  insulin glargine (LANTUS) 100 UNIT/ML Solostar Pen Inject 40 Units into the skin daily. 06/14/21 06/14/22   Gildardo Pounds, NP  Insulin Pen Needle (PEN NEEDLES) 32G X 4 MM MISC Use as instructed. Inject into the skin 5 times per day 06/29/20   Gildardo Pounds, NP  losartan (COZAAR) 25 MG tablet Take 1 tablet (25 mg total) by mouth daily. 06/14/21 09/12/21  Gildardo Pounds, NP  metFORMIN (GLUCOPHAGE-XR) 500 MG 24 hr tablet TAKE 2 TABLETS (1,000 MG TOTAL) BY MOUTH 2 (TWO) TIMES DAILY. 06/14/21 06/14/22  Gildardo Pounds, NP  sertraline (ZOLOFT) 50 MG tablet Take 1 tablet by mouth daily 06/14/21   Gildardo Pounds, NP      Allergies    Codeine    Review of Systems   Review of Systems  Constitutional:  Negative for fever.  HENT:  Negative for facial swelling and rhinorrhea.   Respiratory:  Negative for wheezing and stridor.   Gastrointestinal:  Negative for diarrhea and vomiting.  Skin:  Negative for rash.  Psychiatric/Behavioral:  Negative for self-injury and suicidal ideas.   All other systems reviewed and are negative.   Physical Exam Updated Vital Signs BP (!) 142/80 (BP Location: Left Arm)   Pulse 80   Temp 98.3 F (36.8 C) (Oral)   Resp 16   Ht _0  (1.803 m)   Wt 115.7 kg   SpO2 98%   BMI 35.57 kg/m  Physical Exam Vitals and nursing note reviewed. Exam conducted with a chaperone present.  Constitutional:      General: He is  not in acute distress.    Appearance: He is well-developed. He is diaphoretic.  HENT:     Head: Normocephalic and atraumatic.     Nose: Nose normal.  Eyes:     Conjunctiva/sclera: Conjunctivae normal.     Pupils: Pupils are equal, round, and reactive to light.  Cardiovascular:     Rate and Rhythm: Normal rate and regular rhythm.     Pulses: Normal pulses.     Heart sounds: Normal heart sounds.  Pulmonary:     Effort: Pulmonary effort is normal.     Breath sounds: Normal breath sounds. No wheezing or rales.  Abdominal:     General: Bowel sounds are normal.     Palpations: Abdomen is soft.     Tenderness: There is no abdominal tenderness. There  is no guarding or rebound.  Musculoskeletal:        General: Normal range of motion.     Cervical back: Normal range of motion and neck supple.  Skin:    General: Skin is warm.     Capillary Refill: Capillary refill takes less than 2 seconds.  Neurological:     General: No focal deficit present.     Mental Status: He is alert and oriented to person, place, and time.     Deep Tendon Reflexes: Reflexes normal.  Psychiatric:        Mood and Affect: Mood normal.        Behavior: Behavior normal.     ED Results / Procedures / Treatments   Labs (all labs ordered are listed, but only abnormal results are displayed) Results for orders placed or performed during the hospital encounter of 09/73/53  Basic metabolic panel  Result Value Ref Range   Sodium 129 (L) 135 - 145 mmol/L   Potassium 5.9 (H) 3.5 - 5.1 mmol/L   Chloride 99 98 - 111 mmol/L   CO2 20 (L) 22 - 32 mmol/L   Glucose, Bld 610 (HH) 70 - 99 mg/dL   BUN 13 6 - 20 mg/dL   Creatinine, Ser 1.52 (H) 0.61 - 1.24 mg/dL   Calcium 8.7 (L) 8.9 - 10.3 mg/dL   GFR, Estimated 59 (L) >60 mL/min   Anion gap 10 5 - 15  CBC  Result Value Ref Range   WBC 5.3 4.0 - 10.5 K/uL   RBC 5.25 4.22 - 5.81 MIL/uL   Hemoglobin 16.0 13.0 - 17.0 g/dL   HCT 44.3 39.0 - 52.0 %   MCV 84.4 80.0 - 100.0 fL   MCH 30.5 26.0 - 34.0 pg   MCHC 36.1 (H) 30.0 - 36.0 g/dL   RDW 12.0 11.5 - 15.5 %   Platelets 262 150 - 400 K/uL   nRBC 0.0 0.0 - 0.2 %  Urinalysis, Routine w reflex microscopic  Result Value Ref Range   Color, Urine YELLOW YELLOW   APPearance CLEAR CLEAR   Specific Gravity, Urine 1.010 1.005 - 1.030   pH 7.0 5.0 - 8.0   Glucose, UA >=500 (A) NEGATIVE mg/dL   Hgb urine dipstick NEGATIVE NEGATIVE   Bilirubin Urine NEGATIVE NEGATIVE   Ketones, ur NEGATIVE NEGATIVE mg/dL   Protein, ur NEGATIVE NEGATIVE mg/dL   Nitrite NEGATIVE NEGATIVE   Leukocytes,Ua NEGATIVE NEGATIVE  Ethanol  Result Value Ref Range   Alcohol, Ethyl (B) <10 <10 mg/dL   Acetaminophen level  Result Value Ref Range   Acetaminophen (Tylenol), Serum <10 (L) 10 - 30 ug/mL  Salicylate level  Result Value Ref Range  Salicylate Lvl <9.3 (L) 7.0 - 30.0 mg/dL  Rapid urine drug screen (hospital performed)  Result Value Ref Range   Opiates NONE DETECTED NONE DETECTED   Cocaine NONE DETECTED NONE DETECTED   Benzodiazepines NONE DETECTED NONE DETECTED   Amphetamines NONE DETECTED NONE DETECTED   Tetrahydrocannabinol NONE DETECTED NONE DETECTED   Barbiturates NONE DETECTED NONE DETECTED  Hepatic function panel  Result Value Ref Range   Total Protein 6.7 6.5 - 8.1 g/dL   Albumin 3.4 (L) 3.5 - 5.0 g/dL   AST 494 (H) 15 - 41 U/L   ALT 567 (H) 0 - 44 U/L   Alkaline Phosphatase 101 38 - 126 U/L   Total Bilirubin 0.5 0.3 - 1.2 mg/dL   Bilirubin, Direct <0.1 0.0 - 0.2 mg/dL   Indirect Bilirubin NOT CALCULATED 0.3 - 0.9 mg/dL  Protime-INR  Result Value Ref Range   Prothrombin Time 11.7 11.4 - 15.2 seconds   INR 0.9 0.8 - 1.2  Potassium  Result Value Ref Range   Potassium 4.7 3.5 - 5.1 mmol/L  Urinalysis, Microscopic (reflex)  Result Value Ref Range   RBC / HPF 0-5 0 - 5 RBC/hpf   WBC, UA 0-5 0 - 5 WBC/hpf   Bacteria, UA RARE (A) NONE SEEN   Squamous Epithelial / LPF 0-5 0 - 5  CK  Result Value Ref Range   Total CK 75 49 - 397 U/L  CBG monitoring, ED  Result Value Ref Range   Glucose-Capillary >600 (HH) 70 - 99 mg/dL   Comment 1 Notify RN   CBG monitoring, ED  Result Value Ref Range   Glucose-Capillary 454 (H) 70 - 99 mg/dL  CBG monitoring, ED  Result Value Ref Range   Glucose-Capillary 338 (H) 70 - 99 mg/dL  I-Stat venous blood gas, ED  Result Value Ref Range   pH, Ven 7.393 7.25 - 7.43   pCO2, Ven 40.8 (L) 44 - 60 mmHg   pO2, Ven 46 (H) 32 - 45 mmHg   Bicarbonate 24.8 20.0 - 28.0 mmol/L   TCO2 26 22 - 32 mmol/L   O2 Saturation 81 %   Acid-Base Excess 0.0 0.0 - 2.0 mmol/L   Sodium 131 (L) 135 - 145 mmol/L   Potassium 4.6 3.5 - 5.1 mmol/L    Calcium, Ion 1.24 1.15 - 1.40 mmol/L   HCT 41.0 39.0 - 52.0 %   Hemoglobin 13.9 13.0 - 17.0 g/dL   Patient temperature 98.8 F    Sample type VENOUS    No results found.  EKG EKG Interpretation  Date/Time:  Tuesday January 30 2022 23:07:10 EDT Ventricular Rate:  89 PR Interval:  161 QRS Duration: 90 QT Interval:  362 QTC Calculation: 441 R Axis:   14 Text Interpretation: Sinus rhythm Low voltage, precordial leads Confirmed by Randal Buba, Tallulah Hosman ([id number]) on 01/30/2022 11:24:25 PM  Radiology No results found.  Procedures Procedures    Medications Ordered in ED Medications  insulin regular, human (MYXREDLIN) 100 units/ 100 mL infusion (7.5 Units/hr Intravenous Rate/Dose Change 01/31/22 0050)  lactated ringers infusion (has no administration in time range)  dextrose 5 % in lactated ringers infusion (has no administration in time range)  dextrose 50 % solution 0-50 mL (has no administration in time range)  acetylcysteine (ACETADOTE) 30.5 mg/mL load via infusion 15,000 mg (has no administration in time range)    Followed by  acetylcysteine (ACETADOTE) 18,000 mg in dextrose 5 % 590 mL (30.5085 mg/mL) infusion (has no administration in time range)  acetylcysteine (ACETADOTE) 200 MG/ML injection (has no administration in time range)  lactated ringers bolus 2,314 mL (2,314 mLs Intravenous New Bag/Given 01/31/22 0008)    ED Course/ Medical Decision Making/ A&P                           Medical Decision Making Patient takes 3 boxes of coricidin almost daily to get high, states that he used 3 purple boxes.  No alcohol in 7-8 years.  Not taking trulicity for more than a week   Amount and/or Complexity of Data Reviewed Independent Historian: parent    Details: see above External Data Reviewed: notes.    Details: previous notes reviewed Labs: ordered.    Details: all labs reviewed: first potassium was from a hemolyzed specimen, rechecked and 4.6.  Sodium 129, which is consistent with  pseudohyponatremia given glucose which is elecated at 610 normal anion gap of 10.  elevated creatinine 1.52.  Normal tylenol and salicylate and ETOH levels.  Negative uDS.  AST 494 and ALT 567 are markedly elevated and were normal in September.  Normal VBG.  Normal CBC ECG/medicine tests: ordered and independent interpretation performed. Decision-making details documented in ED Course. Discussion of management or test interpretation with external provider(s): Case d/w Dr. Cyd Silence  Risk Prescription drug management. Decision regarding hospitalization. Risk Details: Given LFTs in a patient who states he is not using alcohol I suspect he has ingested tylenol with his coricidin (multiple preparations have tylenol component and patient uses multiple boxes regularly) previously or chronically but not today as level should be at least somewhat elevated from this am.  I have discussed this with poison control and have initiated N-acetylcysteine to treat chronic or previous tylenol overdose.  Will need every 8 hour labs to follow LFTs.  Patient is also on an insulin drip and have been counseled about taking his medications and adhering to a diabetic diet. The patient appears reasonably stabilized for admission considering the current resources, flow, and capabilities available in the ED at this time, and I doubt any other [facility] requiring further screening and/or treatment in the ED prior to admission.   Critical Care Total time providing critical care: 90 minutes (N-Ac drip and insulin drips initiated by me. Complexity of care, calculations and Calls to poison control, reassessments.  )   Final Clinical Impression(s) / ED Diagnoses Final diagnoses:  None    Rx / DC Orders ED Discharge Orders     None         Kainon Varady, MD 01/31/22 3790

## 2022-02-01 LAB — COMPREHENSIVE METABOLIC PANEL
ALT: 515 U/L — ABNORMAL HIGH (ref 0–44)
AST: 186 U/L — ABNORMAL HIGH (ref 15–41)
Albumin: 3.1 g/dL — ABNORMAL LOW (ref 3.5–5.0)
Alkaline Phosphatase: 84 U/L (ref 38–126)
Anion gap: 10 (ref 5–15)
BUN: 8 mg/dL (ref 6–20)
CO2: 21 mmol/L — ABNORMAL LOW (ref 22–32)
Calcium: 9.2 mg/dL (ref 8.9–10.3)
Chloride: 107 mmol/L (ref 98–111)
Creatinine, Ser: 0.7 mg/dL (ref 0.61–1.24)
GFR, Estimated: >60 mL/min (ref >60)
Glucose, Bld: 228 mg/dL — ABNORMAL HIGH (ref 70–99)
Potassium: 3.7 mmol/L (ref 3.5–5.1)
Sodium: 138 mmol/L (ref 135–145)
Total Bilirubin: 0.7 mg/dL (ref 0.3–1.2)
Total Protein: 6.4 g/dL — ABNORMAL LOW (ref 6.5–8.1)

## 2022-02-01 LAB — PROTIME-INR
INR: 1 (ref 0.8–1.2)
Prothrombin Time: 12.9 seconds (ref 11.4–15.2)

## 2022-02-01 LAB — GLUCOSE, CAPILLARY
Glucose-Capillary: 249 mg/dL — ABNORMAL HIGH (ref 70–99)
Glucose-Capillary: 260 mg/dL — ABNORMAL HIGH (ref 70–99)
Glucose-Capillary: 249 mg/dL — ABNORMAL HIGH (ref 70–99)
Glucose-Capillary: 223 mg/dL — ABNORMAL HIGH (ref 70–99)

## 2022-02-01 LAB — HEPATIC FUNCTION PANEL
ALT: 614 U/L — ABNORMAL HIGH (ref 0–44)
AST: 303 U/L — ABNORMAL HIGH (ref 15–41)
Albumin: 3.2 g/dL — ABNORMAL LOW (ref 3.5–5.0)
Alkaline Phosphatase: 84 U/L (ref 38–126)
Bilirubin, Direct: <0.1 mg/dL (ref 0.0–0.2)
Total Bilirubin: 0.8 mg/dL (ref 0.3–1.2)
Total Protein: 6.4 g/dL — ABNORMAL LOW (ref 6.5–8.1)

## 2022-02-01 LAB — CK
Total CK: 40 U/L — ABNORMAL LOW (ref 49–397)
Total CK: 32 U/L — ABNORMAL LOW (ref 49–397)

## 2022-02-01 MED ORDER — SODIUM CHLORIDE 0.9 % IV SOLN
INTRAVENOUS | Status: AC
Start: 2022-02-01 — End: 2022-02-02

## 2022-02-01 NOTE — Progress Notes (Signed)
PROGRESS NOTE   Jason Wolf  KXF:[id number] DOB: 03-20-1981 DOA: 01/30/2022 PCP: Claiborne Rigg, NP  Brief Narrative:   41 year old male bipolar HTN prior polysubstance abuse with IV drug abuse NIDDM Prior hospitalization 03/2021 for right lacunar TIA Prior group B strep right arm/forearm infection   Patient intentionally consuming 3 boxes dextromethorphan daily to get "high"   Presented to [facility] with fruity taste in mouth dry mouth blurred vision and CBG >600   Sodium 129 potassium 5.9 CBG 610 BUNs/creatinine 10/0.7-->13/1.5 AST/ALT 494/567 CK total 75 WBC five 5.3 hemoglobin 16.0 platelet 262  Hospital-Problem based course  Unintentional overdose of dextromethorphan Supportive management with acetylcysteine continuously given rising ALT Check INR Chem-12 02/01/2022 at around 8 PM Adjust acetylcysteine as per pharmacy recommendations Repeat CK at same time Continue saline 100 cc/H Hypertensive urgency likely secondary to dextromethorphan overdose Continue amlodipine 5, clonidine 0.2 twice daily, hydralazine 5 mg every 6 as needed for pressure above 160 Uncontrolled  diabetes mellitus A1c 12.9 Patient noncompliant apparently per diabetic coordinator's note Reinforced metformin 1000 twice daily-we will continue Semglee insulin 10 units twice daily in addition to sliding scale and mealtime coverage BMI 35 overweight obesity class I Hepatic steatosis No obstructive pathology on ultrasound liver 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 and will need diabetic/dietary counseling in the outpatient setting to reverse steatosis  DVT prophylaxis: Lovenox Code Status: Presumed full Family Communication: None at the bedside Disposition:  Status is: Observation The patient will require care spanning > 2 midnights and should be moved to inpatient because:   Needs further in-hospital monitoring   Consultants:  None  Procedures: No  Antimicrobials: None   Subjective: Awake coherent mild  headache yesterday however today doing fair No chest pain no fever no chills no nausea no vomiting Tolerated full breakfast  Objective: Vitals:   02/01/22 0200 02/01/22 0345 02/01/22 0400 02/01/22 0600  BP: 122/83  136/86 (!) 179/99  Pulse: 80  87 88  Resp: 18  16 18   Temp:  97.8 F (36.6 C)    TempSrc:  Oral    SpO2: 95%  99% 98%  Weight:      Height:        Intake/Output Summary (Last 24 hours) at 02/01/2022 0807 Last data filed at 02/01/2022 04/04/2022 Gross per 24 hour  Intake 3732.51 ml  Output 5575 ml  Net -1842.49 ml   Filed Weights   01/30/22 2301  Weight: 115.7 kg    Examination:  EOMI NCAT thick neck Mallampati 4 somewhat disheveled Chest clear no added sound rales rhonchi wheezing S1-S2 sinus rhythm on monitor Abdomen soft no rebound no guarding Power 5/5 no focal deficit Psych euthymic coherent   Data Reviewed: personally reviewed   CBC    Component Value Date/Time   WBC 6.6 01/31/2022 1004   RBC 4.89 01/31/2022 1004   HGB 14.8 01/31/2022 1004   HGB 14.9 06/29/2020 1500   HCT 41.1 01/31/2022 1004   HCT 42.9 06/29/2020 1500   PLT 247 01/31/2022 1004   PLT 388 06/29/2020 1500   MCV 84.0 01/31/2022 1004   MCV 85 06/29/2020 1500   MCV 86 11/10/2014 1132   MCH 30.3 01/31/2022 1004   MCHC 36.0 01/31/2022 1004   RDW 12.1 01/31/2022 1004   RDW 12.4 06/29/2020 1500   RDW 12.6 11/10/2014 1132   LYMPHSABS 2.1 01/31/2022 1004   MONOABS 0.6 01/31/2022 1004   EOSABS 0.4 01/31/2022 1004   BASOSABS 0.1 01/31/2022 1004      Latest Ref  Rng & Units 01/31/2022   10:50 PM 01/31/2022    8:00 AM 01/31/2022   12:08 AM  CMP  Glucose 70 - 99 mg/dL  191    BUN 6 - 20 mg/dL  9    Creatinine 4.78 - 1.24 mg/dL  2.95    Sodium 621 - 308 mmol/L  132  131   Potassium 3.5 - 5.1 mmol/L  4.0  4.6   Chloride 98 - 111 mmol/L  103    CO2 22 - 32 mmol/L  21    Calcium 8.9 - 10.3 mg/dL  8.3    Total Protein 6.5 - 8.1 g/dL 6.4  6.2    Total Bilirubin 0.3 - 1.2 mg/dL 0.8  1.0     Alkaline Phos 38 - 126 U/L 84  84    AST 15 - 41 U/L 303  366    ALT 0 - 44 U/L 614  588       Radiology Studies: US Abdomen Limited RUQ (LIVER/GB)  Result Date: 01/31/2022 CLINICAL DATA:  Transaminitis. EXAM: ULTRASOUND ABDOMEN LIMITED RIGHT UPPER QUADRANT COMPARISON:  None Available. FINDINGS: Gallbladder: No gallstones or wall thickening visualized. No sonographic Murphy sign noted by sonographer. Common bile duct: Diameter: 3 mm, within normal limits. No intrahepatic biliary ductal dilatation. Liver: Diffusely increased in echogenicity. No focal lesion. Portal vein is patent on color Doppler imaging with normal direction of blood flow towards the liver. Other: None. IMPRESSION: Hepatic steatosis. Electronically Signed   By: Leanna Battles M.D.   On: 01/31/2022 16:12     Scheduled Meds:  amLODipine  5 mg Oral Daily   Chlorhexidine Gluconate Cloth  6 each Topical Daily   cloNIDine  0.2 mg Oral BID   enoxaparin (LOVENOX) injection  40 mg Subcutaneous Q24H   insulin aspart  0-20 Units Subcutaneous TID WC   insulin aspart  0-5 Units Subcutaneous QHS   insulin glargine-yfgn  10 Units Subcutaneous BID   metFORMIN  1,000 mg Oral BID WC   nicotine  21 mg Transdermal Daily   Continuous Infusions:  sodium chloride 100 mL/hr at 02/01/22 6578   acetylcysteine 1,500 mg/hr (02/01/22 0613)     LOS: 0 days   Time spent: 4  Rhetta Mura, MD [facility] To contact the attending provider between 7A-7P or the covering provider during after hours 7P-7A, please log into the web site [email or web address] and access using universal [place] password for that web site. If you do not have the password, please call the hospital operator.  02/01/2022, 8:07 AM

## 2022-02-01 NOTE — Progress Notes (Signed)
    OVERNIGHT PROGRESS REPORT  Notified by pharmacy and RN.  Poison control notification.  Poison control recommendation: Patient meets parameters to stop acetylcysteine now and recheck labs with a.m. labs tomorrow 02/02/2022 -or-  complete current bag infusion and recheck labs at that time.  Discussed with PharmD: We will complete acetylcysteine fusion with current and bag recheck labs after completion  0500 hours.   Chinita Greenland MSNA MSN ACNPC-AG Acute Care Nurse Practitioner [facility]

## 2022-02-01 NOTE — Progress Notes (Signed)
Pharmacy Brief Note - Acetylcysteine Follow Up:  Patient is currently on acetylcysteine infusion per [facility] Poison Control recommendations s/p dextromethorphan overdose.  Labs obtained this evening (@ 2033): -INR: 1 -PT: 12.9 -ALT: 515 -AST: 186  Labs reported to [place] at [facility].   Recommendation:  -Patient meets parameters to stop acetylcysteine, but still some concern with ALT/AST elevation. Recommended could either stop acetylcysteine now and recheck labs with AM labs tomorrow or let current bag infuse to completion and at that time recheck labs.   Discussed with on call provider. Will let acetylcysteine infuse until current bag empty @ ~0500 tomorrow. Recheck INR, LFTs @ 6am tomorrow morning. Case remains open.  Cindi Carbon, PharmD 02/01/22 8:23 PM

## 2022-02-01 NOTE — Progress Notes (Signed)
Brief Pharmacy Note re Acetadote:  Labs at 23h post-therapy initiation:  AST: 303 (dec from 366) ALT: 614 (increasing since admit 567>>588>>614)  VSS  Called poison control and spoke with Angelique Blonder to report labs.  Since ALT increasing she recommended continuing Acetadote for another 24h and repeat Cmet, INR tomorrow evening.   Labs ordered for 8p on 7/6.   Junita Push, PharmD, BCPS 02/01/2022@1 :12 AM

## 2022-02-02 ENCOUNTER — Other Ambulatory Visit: Payer: Self-pay

## 2022-02-02 LAB — PROTIME-INR
INR: 1 (ref 0.8–1.2)
Prothrombin Time: 12.9 seconds (ref 11.4–15.2)

## 2022-02-02 LAB — COMPREHENSIVE METABOLIC PANEL
ALT: 453 U/L — ABNORMAL HIGH (ref 0–44)
AST: 152 U/L — ABNORMAL HIGH (ref 15–41)
Albumin: 3 g/dL — ABNORMAL LOW (ref 3.5–5.0)
Alkaline Phosphatase: 78 U/L (ref 38–126)
Anion gap: 7 (ref 5–15)
BUN: 6 mg/dL (ref 6–20)
CO2: 21 mmol/L — ABNORMAL LOW (ref 22–32)
Calcium: 8.9 mg/dL (ref 8.9–10.3)
Chloride: 108 mmol/L (ref 98–111)
Creatinine, Ser: 0.47 mg/dL — ABNORMAL LOW (ref 0.61–1.24)
GFR, Estimated: >60 mL/min (ref >60)
Glucose, Bld: 225 mg/dL — ABNORMAL HIGH (ref 70–99)
Potassium: 3.7 mmol/L (ref 3.5–5.1)
Sodium: 136 mmol/L (ref 135–145)
Total Bilirubin: 0.5 mg/dL (ref 0.3–1.2)
Total Protein: 6.2 g/dL — ABNORMAL LOW (ref 6.5–8.1)

## 2022-02-02 LAB — GLUCOSE, CAPILLARY: Glucose-Capillary: 219 mg/dL — ABNORMAL HIGH (ref 70–99)

## 2022-02-02 MED ORDER — AMLODIPINE BESYLATE 5 MG PO TABS
5.0000 mg | ORAL_TABLET | Freq: Every day | ORAL | 2 refills | Status: DC
  Filled 2022-02-02: qty 30, 30d supply, fill #0

## 2022-02-02 NOTE — Discharge Summary (Signed)
Physician Discharge Summary  Jason Wolf WUJ:[id number] DOB: 01/15/1981 DOA: 01/30/2022  PCP: Gildardo Pounds, NP  Admit date: 01/30/2022 Discharge date: 02/02/2022  Time spent: 27 minutes  Recommendations for Outpatient Follow-up:  Get Chem-12, CBC in about 1 week Note medication change of losartan to amlodipine secondary to AKI this admission Recommend discussion about taking meds properly-note that patient claims compliance but has not been filling his metformin/insulin-he was discharged with instructions to follow  Discharge Diagnoses:  MAIN problem for hospitalization   Unintentional overdose of cough medicine Elevated LFTs  Please see below for itemized issues addressed in Jason Wolf- refer to other progress notes for clarity if needed  Discharge Condition: Improved  Diet recommendation: Heart healthy  Filed Weights   01/30/22 2301  Weight: 115.7 kg    History of present illness:  41 year old male bipolar HTN prior polysubstance abuse with IV drug abuse NIDDM Prior hospitalization 03/2021 for right lacunar TIA Prior group B strep right arm/forearm infection   Patient intentionally consuming 3 boxes dextromethorphan daily to get "high"   Presented to [place] with fruity taste in mouth dry mouth blurred vision and CBG >600   Sodium 129 potassium 5.9 CBG 610 BUNs/creatinine 10/0.7-->13/1.5 AST/ALT 494/567 CK total 75 WBC five 5.3 hemoglobin 16.0 platelet 262  Patient initially was admitted to the stepdown unit because of hyperglycemia placed on Glucomander but subsequently weaned off   Hospital Course:  Unintentional overdose of dextromethorphan Supportive management with acetylcysteine was given during hospital stay and ultimately ALT trended down slowly-there was a component of hepatic steatosis to his LFT elevation and this will need to be worked up in the outpatient setting Check INR Chem-12 02/01/2022 at around 8 PM Acetylcysteine was stopped on 7/6 PM, CK levels  were low  patient was on IV saline 100 cc/24 which was discontinued on discharge AKI on admission likely secondary to hyperglycemia, ARB Resolved on discharge Hypertensive urgency likely secondary to dextromethorphan overdose Continue amlodipine 5 on discharge and will give supply as well as a refill May need to go back on ARB as an outpatient Uncontrolled  diabetes mellitus A1c 12.9 Patient noncompliant apparently per diabetic coordinator's note Reinforced metformin 1000 twice daily--placed on sliding scale and 10 units long-acting and was resumed on home dosing on discharge BMI 35 overweight obesity class I Hepatic steatosis No obstructive pathology on ultrasound liver 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 and will need diabetic/dietary counseling in the outpatient setting to reverse steatosis    Discharge Exam: Vitals:   02/02/22 0000 02/02/22 0400  BP: (!) 147/91 (!) 125/91  Pulse: 84 77  Resp: 18 14  Temp:    SpO2: 98% 98%    Subj on day of d/c   Awake coherent no distress EOMI NCAT No Weakness Eating and drinking  General Exam on discharge  EOMI NCAT No added sound No wheeze no rales no rhonchi Abdomen soft no rebound no guarding No lower extremity edema Neurologically intact    Discharge Instructions   Discharge Instructions     Diet - low sodium heart healthy   Complete by: As directed    Discharge instructions   Complete by: As directed    Please make sure that you take your medications as indicated-you need to be on your insulin as well as your metformin and other medications for your blood sugar control and it is important that you do this as you have a very high risk of elevated blood sugars and getting sick because of this Do not  take any further over-the-counter cough medications Recommend Chem-12, CBC in about 1 week and follow-up with the community health and wellness You will notice that we discontinued your losartan this admission because you had some  dehydration-it is important to take the new medication amlodipine we have given you 1 refill you need to follow-up for further refills with your primary doctor   Increase activity slowly   Complete by: As directed       Allergies as of 02/02/2022       Reactions   Bee Venom Anaphylaxis   Wasp Venom Anaphylaxis   Yellow Jacket Venom Anaphylaxis   Codeine Nausea And Vomiting, Other (See Comments)   headaches        Medication List     STOP taking these medications    atorvastatin 80 MG tablet Commonly known as: LIPITOR   CORICIDIN HBP COUGH/COLD PO   hydrOXYzine 25 MG capsule Commonly known as: VISTARIL   ibuprofen 200 MG tablet Commonly known as: ADVIL   losartan 25 MG tablet Commonly known as: COZAAR   sertraline 50 MG tablet Commonly known as: ZOLOFT   Trulicity 1.5 EQ/6.8TM Sopn Generic drug: Dulaglutide       TAKE these medications    amLODipine 5 MG tablet Commonly known as: NORVASC Take 1 tablet (5 mg total) by mouth daily.   Basaglar KwikPen 100 UNIT/ML Inject 40 Units into the skin daily. What changed: when to take this   glucose monitoring kit monitoring kit 1 each by Does not apply route 4 (four) times daily - after meals and at bedtime. 1 month Diabetic Testing Supplies for QAC-QHS accuchecks.   True Metrix Meter w/Device Kit Use as instructed. Check blood glucose level by fingerstick 3-5 times per day.   melatonin 5 MG Tabs Take 5-10 mg by mouth at bedtime.   metFORMIN 500 MG 24 hr tablet Commonly known as: GLUCOPHAGE-XR TAKE 2 TABLETS (1,000 MG TOTAL) BY MOUTH 2 (TWO) TIMES DAILY. What changed: how much to take   Pen Needles 32G X 4 MM Misc Use as instructed. Inject into the skin 5 times per day   TUMS CHEWY DELIGHTS PO Take 2 tablets by mouth daily as needed (heartburn).       Allergies  Allergen Reactions   Bee Venom Anaphylaxis   Wasp Venom Anaphylaxis   Yellow Jacket Venom Anaphylaxis   Codeine Nausea And Vomiting and  Other (See Comments)    headaches      The results of significant diagnostics from this hospitalization (including imaging, microbiology, ancillary and laboratory) are listed below for reference.    Significant Diagnostic Studies: US Abdomen Limited RUQ (LIVER/GB)  Result Date: 01/31/2022 CLINICAL DATA:  Transaminitis. EXAM: ULTRASOUND ABDOMEN LIMITED RIGHT UPPER QUADRANT COMPARISON:  None Available. FINDINGS: Gallbladder: No gallstones or wall thickening visualized. No sonographic Murphy sign noted by sonographer. Common bile duct: Diameter: 3 mm, within normal limits. No intrahepatic biliary ductal dilatation. Liver: Diffusely increased in echogenicity. No focal lesion. Portal vein is patent on color Doppler imaging with normal direction of blood flow towards the liver. Other: None. IMPRESSION: Hepatic steatosis. Electronically Signed   By: Lorin Picket M.D.   On: 01/31/2022 16:12    Microbiology: Recent Results (from the past 240 hour(s))  MRSA Next Gen by PCR, Nasal     Status: None   Collection Time: 01/31/22  2:22 AM   Specimen: Nasal Mucosa; Nasal Swab  Result Value Ref Range Status   MRSA by PCR Next Gen NOT  DETECTED NOT DETECTED Final    Comment: (NOTE) The GeneXpert MRSA Assay (FDA approved for NASAL specimens only), is one component of a comprehensive MRSA colonization surveillance program. It is not intended to diagnose MRSA infection nor to guide or monitor treatment for MRSA infections. Test performance is not FDA approved in patients less than 38 years old. Performed at [facility], [place] [address] [place], [place] [address]      Labs: Basic Metabolic Panel: Recent Labs  Lab 01/30/22 2307 01/30/22 2353 01/31/22 0008 01/31/22 0800 02/01/22 2033 02/02/22 0715  NA 129*  --  131* 132* 138 136  K 5.9* 4.7 4.6 4.0 3.7 3.7  CL 99  --   --  103 107 108  CO2 20*  --   --  21* 21* 21*  GLUCOSE 610*  --   --  291* 228* 225*  BUN 13  --   --  _0 CREATININE 1.52*  --   --  0.58* 0.70 0.47*  CALCIUM 8.7*  --   --  8.3* 9.2 8.9  MG  --   --   --  1.8  --   --   PHOS  --   --   --  2.3*  --   --    Liver Function Tests: Recent Labs  Lab 01/30/22 2353 01/31/22 0800 01/31/22 2250 02/01/22 2033 02/02/22 0715  AST 494* 366* 303* 186* 152*  ALT 567* 588* 614* 515* 453*  ALKPHOS 101 84 84 84 78  BILITOT 0.5 1.0 0.8 0.7 0.5  PROT 6.7 6.2* 6.4* 6.4* 6.2*  ALBUMIN 3.4* 3.1* 3.2* 3.1* 3.0*   No results for input(s): "LIPASE", "AMYLASE" in the last 168 hours. No results for input(s): "AMMONIA" in the last 168 hours. CBC: Recent Labs  Lab 01/30/22 2307 01/31/22 0008 01/31/22 1004  WBC 5.3  --  6.6  NEUTROABS  --   --  3.5  HGB 16.0 13.9 14.8  HCT 44.3 41.0 41.1  MCV 84.4  --  84.0  PLT 262  --  247   Cardiac Enzymes: Recent Labs  Lab 01/30/22 2353 02/01/22 0250 02/01/22 2033  CKTOTAL 75 40* 32*   BNP: BNP (last 3 results) No results for input(s): "BNP" in the last 8760 hours.  ProBNP (last 3 results) No results for input(s): "PROBNP" in the last 8760 hours.  CBG: Recent Labs  Lab 02/01/22 0733 02/01/22 1159 02/01/22 1558 02/01/22 2201 02/02/22 0737  GLUCAP 249* 260* 249* 223* 219*       Signed:  Nita Sells MD   [facility] 02/02/2022, 8:10 AM

## 2022-02-02 NOTE — Discharge Instructions (Signed)
?  Outpatient Substance Use Treatment Services  ? ?[facility] ?[address] ?[place], [place] [address]  ?[phone] ?Private insurance, Medicare A&B, and GCCN  ? ?[facility]  ?[address]  ?[place], [place] [address] ?[phone] ?Medicaid, Self Pay  ? ?[facility]      ?[address]  ?[place], [place] ?[phone] ?Medicaid and Private Insurance, Self Pay  ? ?[facility] ?[address]  ?[place], [place]  ?[phone] ?Private Insurance, and Self Pay ? ?[facility]      ?[address]    ?[place], [place] [address]  ?[phone] or [phone] ?Private Insurance Only  ? ? ?[facility] ?[address]  ?[place], [place] [address] ?[phone] ?Medicaid, Medicare, Private Insurance ? ?[facility] ?[address]  ?[place], [place] [address] ?[phone] ?Services are free or reduced ? ?[facility]  ?[address] ?[phone]  ?Self Pay only, sliding scale ? ?[facility]  ?[address]  ?[place], [place] [address] ?[phone] ?([facility]) ?Self Pay, Medicaid Only  ? ?[facility] ?[address]  ?[place], [place] [address] ?[phone] ?Medicaid, Medicare, Private Insurance ? ? ? ? ?Residential Substance Use Treatment Services  ? ?[facility]  ?[address]  ?[place], [place] [address]  ?[phone] or [phone] ?Detox (Medicare, Medicaid, private insurance, and self pay)  ?Residential Rehab 14 days (Medicare, Medicaid, private insurance, and self pay)  ? ?[facility]  ?[address] [place], [place]  ?[phone]  ?Male and Male Detox (Self Pay and Medicaid limited availability)  ?Rehab only Male (Medicaid and self pay only)  ? ?[facility]      ?[address]  ?[place], [place] [address]  ?[phone] or [phone] ?Detox and Residential Treatment ?Private Insurance Only  ? ?[facility]  ?[address]  ?[place], [place] [address]  ?[phone]  ?Treatment Only, must make assessment appointment, and must be sober for assessment appointment.  ?Self Pay Only, Medicare A&B, [place] Medicaid, [place] ID only! ?*Transportation assistance offered from [employer] [address] ? ?[facility]     ?[address] ?[place], [place] [address] ?Walk in interviews M-Sat 8-4p ?No pending legal charges ?[phone] ? ? ? ? ?[facility]:  [facility] ?Referral  ?[address] ?[place], [place] ?[phone] ?(Self Pay, Medicaid) ? ?[facility] ?[address] ?[place], [place] [address] ?[phone] ?Detox and Residential Treatment ?Medicare and Private Insurance ? ?[facility] ?[address]  ?[place], [place] [address] ?28 Day Women's Facility: [phone] ?28 Day Men's Facility: [phone] ?Long-term Residential Program:  ?[phone] Males 25 and Over ?(No Insurance, upfront fee) ? ?[facility]  ?[address] ?[place], [place] [address] ?[phone] ?Private Insurance with [employer], Private Pay ? ?[facility] ?[address] ?[place], [place] [address] ?Local [phone] ?Private Insurance Only ? ?[facility] ?[address]  ?, [place] [address]  ?[phone] ?(Males, upfront fee) ? ?[facility] [place] ?[address]  ?[place], [address] ?[phone] ?Private Insurance  ?

## 2022-02-02 NOTE — TOC Transition Note (Signed)
Transition of Care [facility]) - CM/SW Discharge Note   Patient Details  Name: Jason Wolf MRN: [id number] Date of Birth: 04-28-1981  Transition of Care [facility] CM/SW Contact:  Bader Stubblefield, Meriam Sprague, RN Phone Number: 02/02/2022, 9:11 AM   Clinical Narrative:     Substance abuse resources placed on AVS. Pt has an appointment with the pharmacy at the [facility] to pick up dc meds today.

## 2022-02-05 ENCOUNTER — Other Ambulatory Visit: Payer: Self-pay

## 2022-02-05 ENCOUNTER — Telehealth: Payer: Self-pay

## 2022-02-05 NOTE — Telephone Encounter (Signed)
Transition Care Management Unsuccessful Follow-up Telephone Call  Date of discharge and from where:  02/02/2022, [facility]  Attempts:  1st Attempt  Reason for unsuccessful TCM follow-up call:  Left voice message on # [phone], call back requested.   Needs hospital follow up appointment

## 2022-02-06 ENCOUNTER — Telehealth: Payer: Self-pay

## 2022-02-06 NOTE — Telephone Encounter (Signed)
Transition Care Management Unsuccessful Follow-up Telephone Call  Date of discharge and from where:  02/02/2022, [facility]  Attempts:  2nd Attempt  Reason for unsuccessful TCM follow-up call:  Left voice message on # [phone], call back requested.    Needs hospital follow up appointment

## 2022-02-07 ENCOUNTER — Telehealth: Payer: Self-pay

## 2022-02-07 ENCOUNTER — Ambulatory Visit: Payer: Self-pay

## 2022-02-07 NOTE — Patient Outreach (Signed)
  Care Coordination [facility] Note Transition Care Management Unsuccessful Follow-up Telephone Call  Date of discharge and from where:  02/02/22 Jason Wolf  Attempts:  3rd Attempt  Reason for unsuccessful TCM follow-up call:  Left voice message  Bevelyn Ngo, Kenard Gower, CDP Social Worker, Certified Dementia Practitioner [facility] Care Management [phone]

## 2022-02-07 NOTE — Telephone Encounter (Signed)
Letter sent to patient requesting he contact [facility] to schedule a hospital follow up appointment as we have not been able to reach him.  ?

## 2022-03-27 DIAGNOSIS — E119 Type 2 diabetes mellitus without complications: Secondary | ICD-10-CM | POA: Diagnosis not present

## 2022-05-04 DIAGNOSIS — H6001 Abscess of right external ear: Secondary | ICD-10-CM | POA: Diagnosis not present

## 2022-05-04 DIAGNOSIS — H6011 Cellulitis of right external ear: Secondary | ICD-10-CM | POA: Diagnosis not present

## 2022-05-08 ENCOUNTER — Other Ambulatory Visit: Payer: Self-pay

## 2022-05-10 DIAGNOSIS — I10 Essential (primary) hypertension: Secondary | ICD-10-CM | POA: Diagnosis not present

## 2022-05-10 DIAGNOSIS — Z13220 Encounter for screening for lipoid disorders: Secondary | ICD-10-CM | POA: Diagnosis not present

## 2022-05-10 DIAGNOSIS — H6011 Cellulitis of right external ear: Secondary | ICD-10-CM | POA: Diagnosis not present

## 2022-05-10 DIAGNOSIS — Z113 Encounter for screening for infections with a predominantly sexual mode of transmission: Secondary | ICD-10-CM | POA: Diagnosis not present

## 2022-05-10 DIAGNOSIS — E1165 Type 2 diabetes mellitus with hyperglycemia: Secondary | ICD-10-CM | POA: Diagnosis not present

## 2022-05-10 DIAGNOSIS — R7401 Elevation of levels of liver transaminase levels: Secondary | ICD-10-CM | POA: Diagnosis not present

## 2022-05-10 DIAGNOSIS — K760 Fatty (change of) liver, not elsewhere classified: Secondary | ICD-10-CM | POA: Diagnosis not present

## 2022-05-10 DIAGNOSIS — H6001 Abscess of right external ear: Secondary | ICD-10-CM | POA: Diagnosis not present

## 2022-05-14 DIAGNOSIS — B182 Chronic viral hepatitis C: Secondary | ICD-10-CM | POA: Diagnosis not present

## 2022-05-14 DIAGNOSIS — H6011 Cellulitis of right external ear: Secondary | ICD-10-CM | POA: Diagnosis not present

## 2022-05-14 DIAGNOSIS — R7401 Elevation of levels of liver transaminase levels: Secondary | ICD-10-CM | POA: Diagnosis not present

## 2022-05-14 DIAGNOSIS — H6001 Abscess of right external ear: Secondary | ICD-10-CM | POA: Diagnosis not present

## 2022-05-15 DIAGNOSIS — Z23 Encounter for immunization: Secondary | ICD-10-CM | POA: Diagnosis not present

## 2022-05-16 ENCOUNTER — Other Ambulatory Visit: Payer: Self-pay

## 2022-05-16 DIAGNOSIS — Z23 Encounter for immunization: Secondary | ICD-10-CM | POA: Diagnosis not present

## 2022-05-16 DIAGNOSIS — E1165 Type 2 diabetes mellitus with hyperglycemia: Secondary | ICD-10-CM | POA: Diagnosis not present

## 2022-05-16 DIAGNOSIS — I10 Essential (primary) hypertension: Secondary | ICD-10-CM | POA: Diagnosis not present

## 2022-05-17 ENCOUNTER — Other Ambulatory Visit: Payer: Self-pay

## 2022-05-18 ENCOUNTER — Other Ambulatory Visit: Payer: Self-pay

## 2022-05-18 MED ORDER — MOUNJARO 2.5 MG/0.5ML ~~LOC~~ SOAJ
2.5000 mg | SUBCUTANEOUS | 1 refills | Status: DC
  Filled 2022-05-18 – 2022-05-28 (×2): qty 2, 28d supply, fill #0

## 2022-05-18 MED ORDER — NOVOLOG FLEXPEN 100 UNIT/ML ~~LOC~~ SOPN
15.0000 [IU] | PEN_INJECTOR | SUBCUTANEOUS | 3 refills | Status: DC
  Filled 2022-05-18: qty 18, 30d supply, fill #0

## 2022-05-18 MED ORDER — FREESTYLE LIBRE 3 SENSOR MISC
11 refills | Status: AC
  Filled 2022-05-18: qty 2, 28d supply, fill #0

## 2022-05-21 ENCOUNTER — Other Ambulatory Visit: Payer: Self-pay

## 2022-05-24 ENCOUNTER — Other Ambulatory Visit: Payer: Self-pay

## 2022-05-24 ENCOUNTER — Other Ambulatory Visit (HOSPITAL_COMMUNITY): Payer: Self-pay

## 2022-05-28 ENCOUNTER — Other Ambulatory Visit: Payer: Self-pay

## 2022-05-28 DIAGNOSIS — B182 Chronic viral hepatitis C: Secondary | ICD-10-CM | POA: Diagnosis not present

## 2022-05-28 DIAGNOSIS — E1165 Type 2 diabetes mellitus with hyperglycemia: Secondary | ICD-10-CM | POA: Diagnosis not present

## 2022-05-28 DIAGNOSIS — R7401 Elevation of levels of liver transaminase levels: Secondary | ICD-10-CM | POA: Diagnosis not present

## 2022-05-28 DIAGNOSIS — L720 Epidermal cyst: Secondary | ICD-10-CM | POA: Diagnosis not present

## 2022-06-05 DIAGNOSIS — Z23 Encounter for immunization: Secondary | ICD-10-CM | POA: Diagnosis not present

## 2022-06-11 DIAGNOSIS — R7401 Elevation of levels of liver transaminase levels: Secondary | ICD-10-CM | POA: Diagnosis not present

## 2022-06-11 DIAGNOSIS — E1165 Type 2 diabetes mellitus with hyperglycemia: Secondary | ICD-10-CM | POA: Diagnosis not present

## 2022-06-11 DIAGNOSIS — B182 Chronic viral hepatitis C: Secondary | ICD-10-CM | POA: Diagnosis not present

## 2022-06-23 ENCOUNTER — Other Ambulatory Visit: Payer: Self-pay

## 2022-06-23 ENCOUNTER — Encounter (HOSPITAL_COMMUNITY): Payer: Self-pay

## 2022-06-23 ENCOUNTER — Emergency Department (HOSPITAL_COMMUNITY)
Admission: EM | Admit: 2022-06-23 | Discharge: 2022-06-25 | Disposition: A | Discharge status: Home / Self Care | Payer: BC Managed Care – PPO | Attending: Emergency Medicine | Admitting: Emergency Medicine

## 2022-06-23 DIAGNOSIS — R45851 Suicidal ideations: Secondary | ICD-10-CM | POA: Diagnosis not present

## 2022-06-23 DIAGNOSIS — T485X2A Poisoning by other anti-common-cold drugs, intentional self-harm, initial encounter: Secondary | ICD-10-CM | POA: Insufficient documentation

## 2022-06-23 DIAGNOSIS — R0689 Other abnormalities of breathing: Secondary | ICD-10-CM | POA: Diagnosis not present

## 2022-06-23 DIAGNOSIS — Z794 Long term (current) use of insulin: Secondary | ICD-10-CM | POA: Diagnosis not present

## 2022-06-23 DIAGNOSIS — T50902A Poisoning by unspecified drugs, medicaments and biological substances, intentional self-harm, initial encounter: Secondary | ICD-10-CM

## 2022-06-23 DIAGNOSIS — R410 Disorientation, unspecified: Secondary | ICD-10-CM | POA: Diagnosis not present

## 2022-06-23 DIAGNOSIS — Z79899 Other long term (current) drug therapy: Secondary | ICD-10-CM | POA: Insufficient documentation

## 2022-06-23 DIAGNOSIS — E119 Type 2 diabetes mellitus without complications: Secondary | ICD-10-CM | POA: Insufficient documentation

## 2022-06-23 DIAGNOSIS — I10 Essential (primary) hypertension: Secondary | ICD-10-CM | POA: Insufficient documentation

## 2022-06-23 DIAGNOSIS — T484X1A Poisoning by expectorants, accidental (unintentional), initial encounter: Secondary | ICD-10-CM | POA: Diagnosis not present

## 2022-06-23 DIAGNOSIS — F319 Bipolar disorder, unspecified: Secondary | ICD-10-CM | POA: Insufficient documentation

## 2022-06-23 DIAGNOSIS — T483X1A Poisoning by antitussives, accidental (unintentional), initial encounter: Secondary | ICD-10-CM | POA: Diagnosis present

## 2022-06-23 DIAGNOSIS — X838XXA Intentional self-harm by other specified means, initial encounter: Secondary | ICD-10-CM | POA: Insufficient documentation

## 2022-06-23 DIAGNOSIS — T483X2A Poisoning by antitussives, intentional self-harm, initial encounter: Secondary | ICD-10-CM

## 2022-06-23 DIAGNOSIS — T450X1A Poisoning by antiallergic and antiemetic drugs, accidental (unintentional), initial encounter: Secondary | ICD-10-CM | POA: Diagnosis not present

## 2022-06-23 DIAGNOSIS — Z7984 Long term (current) use of oral hypoglycemic drugs: Secondary | ICD-10-CM | POA: Insufficient documentation

## 2022-06-23 DIAGNOSIS — R000 Tachycardia, unspecified: Secondary | ICD-10-CM | POA: Diagnosis not present

## 2022-06-23 DIAGNOSIS — F1910 Other psychoactive substance abuse, uncomplicated: Secondary | ICD-10-CM | POA: Diagnosis not present

## 2022-06-23 LAB — RAPID URINE DRUG SCREEN, HOSP PERFORMED
Amphetamines: POSITIVE — AB
Barbiturates: NOT DETECTED
Benzodiazepines: NOT DETECTED
Cocaine: NOT DETECTED
Opiates: NOT DETECTED
Tetrahydrocannabinol: NOT DETECTED

## 2022-06-23 LAB — CBG MONITORING, ED
Glucose-Capillary: 196 mg/dL — ABNORMAL HIGH (ref 70–99)
Glucose-Capillary: 120 mg/dL — ABNORMAL HIGH (ref 70–99)
Glucose-Capillary: 110 mg/dL — ABNORMAL HIGH (ref 70–99)
Glucose-Capillary: 153 mg/dL — ABNORMAL HIGH (ref 70–99)
Glucose-Capillary: 207 mg/dL — ABNORMAL HIGH (ref 70–99)

## 2022-06-23 LAB — COMPREHENSIVE METABOLIC PANEL
ALT: 101 U/L — ABNORMAL HIGH (ref 0–44)
AST: 57 U/L — ABNORMAL HIGH (ref 15–41)
Albumin: 4.3 g/dL (ref 3.5–5.0)
Alkaline Phosphatase: 74 U/L (ref 38–126)
Anion gap: 9 (ref 5–15)
BUN: 8 mg/dL (ref 6–20)
CO2: 24 mmol/L (ref 22–32)
Calcium: 8.8 mg/dL — ABNORMAL LOW (ref 8.9–10.3)
Chloride: 102 mmol/L (ref 98–111)
Creatinine, Ser: 0.7 mg/dL (ref 0.61–1.24)
GFR, Estimated: >60 mL/min (ref >60)
Glucose, Bld: 179 mg/dL — ABNORMAL HIGH (ref 70–99)
Potassium: 3.5 mmol/L (ref 3.5–5.1)
Sodium: 135 mmol/L (ref 135–145)
Total Bilirubin: 0.9 mg/dL (ref 0.3–1.2)
Total Protein: 7.6 g/dL (ref 6.5–8.1)

## 2022-06-23 LAB — CBC WITH DIFFERENTIAL/PLATELET
Abs Immature Granulocytes: 0.05 10*3/uL (ref 0.00–0.07)
Basophils Absolute: 0.1 10*3/uL (ref 0.0–0.1)
Basophils Relative: 1 %
Eosinophils Absolute: 0.2 10*3/uL (ref 0.0–0.5)
Eosinophils Relative: 3 %
HCT: 38.6 % — ABNORMAL LOW (ref 39.0–52.0)
Hemoglobin: 13.8 g/dL (ref 13.0–17.0)
Immature Granulocytes: 1 %
Lymphocytes Relative: 25 %
Lymphs Abs: 2.3 10*3/uL (ref 0.7–4.0)
MCH: 31.2 pg (ref 26.0–34.0)
MCHC: 35.8 g/dL (ref 30.0–36.0)
MCV: 87.3 fL (ref 80.0–100.0)
Monocytes Absolute: 0.9 10*3/uL (ref 0.1–1.0)
Monocytes Relative: 10 %
Neutro Abs: 5.7 10*3/uL (ref 1.7–7.7)
Neutrophils Relative %: 60 %
Platelets: 321 10*3/uL (ref 150–400)
RBC: 4.42 MIL/uL (ref 4.22–5.81)
RDW: 12 % (ref 11.5–15.5)
WBC: 9.3 10*3/uL (ref 4.0–10.5)
nRBC: 0 % (ref 0.0–0.2)

## 2022-06-23 LAB — MAGNESIUM: Magnesium: 1.8 mg/dL (ref 1.7–2.4)

## 2022-06-23 LAB — ACETAMINOPHEN LEVEL: Acetaminophen (Tylenol), Serum: <10 ug/mL — ABNORMAL LOW (ref 10–30)

## 2022-06-23 LAB — ETHANOL: Alcohol, Ethyl (B): <10 mg/dL (ref <10)

## 2022-06-23 LAB — SALICYLATE LEVEL: Salicylate Lvl: <7 mg/dL — ABNORMAL LOW (ref 7.0–30.0)

## 2022-06-23 MED ORDER — INSULIN GLARGINE-YFGN 100 UNIT/ML ~~LOC~~ SOLN
40.0000 [IU] | Freq: Every day | SUBCUTANEOUS | Status: DC
  Administered 2022-06-23 – 2022-06-24 (×2): 40 [IU] via SUBCUTANEOUS
  Filled 2022-06-23 (×3): qty 0.4

## 2022-06-23 MED ORDER — SODIUM CHLORIDE 0.9 % IV BOLUS
1000.0000 mL | Freq: Once | INTRAVENOUS | Status: AC
  Administered 2022-06-23: 1000 mL via INTRAVENOUS

## 2022-06-23 MED ORDER — AMLODIPINE BESYLATE 5 MG PO TABS
5.0000 mg | ORAL_TABLET | Freq: Every day | ORAL | Status: DC
  Administered 2022-06-23 – 2022-06-25 (×3): 5 mg via ORAL
  Filled 2022-06-23 (×4): qty 1

## 2022-06-23 MED ORDER — METFORMIN HCL ER 500 MG PO TB24: 1000.0000 mg | ORAL_TABLET | Freq: Two times a day (BID) | ORAL | Status: DC

## 2022-06-23 MED ORDER — INSULIN ASPART 100 UNIT/ML IJ SOLN
0.0000 [IU] | Freq: Every day | INTRAMUSCULAR | Status: DC
  Administered 2022-06-23: 2 [IU] via SUBCUTANEOUS
  Filled 2022-06-23: qty 0.05

## 2022-06-23 MED ORDER — INSULIN ASPART 100 UNIT/ML IJ SOLN
0.0000 [IU] | Freq: Three times a day (TID) | INTRAMUSCULAR | Status: DC
  Administered 2022-06-24: 8 [IU] via SUBCUTANEOUS
  Administered 2022-06-24: 5 [IU] via SUBCUTANEOUS
  Administered 2022-06-25: 8 [IU] via SUBCUTANEOUS
  Administered 2022-06-25: 3 [IU] via SUBCUTANEOUS
  Administered 2022-06-25: 8 [IU] via SUBCUTANEOUS
  Filled 2022-06-23: qty 0.15

## 2022-06-23 MED ORDER — MELATONIN 5 MG PO TABS
5.0000 mg | ORAL_TABLET | Freq: Every day | ORAL | Status: DC
  Administered 2022-06-23: 10 mg via ORAL
  Administered 2022-06-24: 5 mg via ORAL
  Filled 2022-06-23: qty 1
  Filled 2022-06-23: qty 2

## 2022-06-23 MED ORDER — INSULIN ASPART 100 UNIT/ML FLEXPEN: 15.0000 [IU] | PEN_INJECTOR | SUBCUTANEOUS | Status: DC

## 2022-06-23 NOTE — ED Triage Notes (Signed)
[facility] the [facility] ([facility], he is a Consulting civil engineer). Pt reports taking 4 boxes (64 tablets total) of Chlorpheniramine maleate 4 mg Dextromethorphan HBR 30 mg. Pt admits SI to EMS but is currently denying SI at this time. Pt appears to be drowsy but is answering questions. Pt is also sweaty.

## 2022-06-23 NOTE — ED Notes (Signed)
Pt has been dressed out and two bags of belongs were collected:  Bag 1 : pants and sweater  Bag 2  shoes, phone (with ID and credit cards on the back), and bag of RX  The belongings have been placed in the cabinet at recess marked "Pt belongings 16-18 Resus A"  Security has wanded the patient.
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
## 2022-06-23 NOTE — ED Provider Notes (Signed)
[facility] DEPT Provider Note   CSN: [id number] Arrival date & time: 06/23/22  0236     History  Chief Complaint  Patient presents with   Ingestion    Jason Wolf is a 41 y.o. male.  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  He called EMS complaining of suicidal thoughts and reported that he took 64 tablets of chlorpheniramine and dextromethorphan.  Time of ingestion is unknown as is other coingestions.  He is presently denying suicidal ideations.  Is drowsy and fairly uncooperative, but answering some questions.  He has a history of overdose necessitating admission last in July 2023.  The history is provided by the patient and the EMS personnel. No language interpreter was used.       Home Medications Prior to Admission medications   Medication Sig Start Date End Date Taking? Authorizing Provider  amLODipine (NORVASC) 5 MG tablet Take 1 tablet (5 mg total) by mouth daily. 02/02/22   Nita Sells, MD  Blood Glucose Monitoring Suppl (TRUE METRIX METER) w/Device KIT Use as instructed. Check blood glucose level by fingerstick 3-5 times per day. 06/29/20   Gildardo Pounds, NP  Calcium Carbonate Antacid (TUMS CHEWY DELIGHTS PO) Take 2 tablets by mouth daily as needed (heartburn).    [provider]  Continuous Blood Gluc Sensor (FREESTYLE LIBRE 3 SENSOR) MISC Change sensor every 14 days. 05/18/22     glucose monitoring kit (FREESTYLE) monitoring kit 1 each by Does not apply route 4 (four) times daily - after meals and at bedtime. 1 month Diabetic Testing Supplies for QAC-QHS accuchecks. 02/23/20   Ghimire, Henreitta Leber, MD  insulin aspart (NOVOLOG FLEXPEN) 100 UNIT/ML FlexPen Inject 15-20 Units into the skin three times daily and correctional scale as directed. 05/18/22     insulin glargine (LANTUS) 100 UNIT/ML Solostar Pen Inject 40 Units into the skin daily. Patient taking differently:  Inject 40 Units into the skin at bedtime. 06/14/21 06/14/22  Gildardo Pounds, NP  Insulin Pen Needle (PEN NEEDLES) 32G X 4 MM MISC Use as instructed. Inject into the skin 5 times per day 06/29/20   Gildardo Pounds, NP  melatonin 5 MG TABS Take 5-10 mg by mouth at bedtime.    [provider]  metFORMIN (GLUCOPHAGE-XR) 500 MG 24 hr tablet TAKE 2 TABLETS (1,000 MG TOTAL) BY MOUTH 2 (TWO) TIMES DAILY. Patient taking differently: Take 1,000 mg by mouth 2 (two) times daily. 06/14/21 06/14/22  Gildardo Pounds, NP  tirzepatide [facility]) 2.5 MG/0.5ML Pen Inject 2.5 mg into the skin once a week. 05/18/22         Allergies    Bee venom, Wasp venom, Yellow jacket venom, and Codeine    Review of Systems   Review of Systems Ten systems reviewed and are negative for acute change, except as noted in the HPI.    Physical Exam Updated Vital Signs BP (!) 183/108   Pulse (!) 108   Temp 99.7 F (37.6 C) (Rectal)   Resp (!) 26   SpO2 97%   Physical Exam Vitals and nursing note reviewed.  Constitutional:      Appearance: He is well-developed. He is diaphoretic.     Comments: Drowsy appearing, but awake.  HENT:     Head: Normocephalic and atraumatic.  Eyes:     General: No scleral icterus.    Extraocular Movements: Extraocular movements intact.     Conjunctiva/sclera: Conjunctivae normal.  Comments: 63m, reactive  Pulmonary:     Effort: Pulmonary effort is normal. No respiratory distress.     Comments: Respirations even and unlabored. Lungs CTAB. Abdominal:     Comments: Abdomen soft and obese. No guarding on palpation.  Musculoskeletal:        General: Normal range of motion.     Cervical back: Normal range of motion.  Skin:    General: Skin is warm.     Coloration: Skin is not pale.     Findings: No erythema or rash.  Neurological:     Mental Status: He is oriented to person, place, and time.  Psychiatric:        Attention and Perception: He is inattentive.        Mood  and Affect: Mood is depressed.        Speech: Speech is delayed.        Behavior: Behavior is uncooperative.        Thought Content: Thought content does not include suicidal ideation.        Judgment: Judgment is impulsive.     ED Results / Procedures / Treatments   Labs (all labs ordered are listed, but only abnormal results are displayed) Labs Reviewed  CBC WITH DIFFERENTIAL/PLATELET - Abnormal; Notable for the following components:      Result Value   HCT 38.6 (*)    All other components within normal limits  COMPREHENSIVE METABOLIC PANEL - Abnormal; Notable for the following components:   Glucose, Bld 179 (*)    Calcium 8.8 (*)    AST 57 (*)    ALT 101 (*)    All other components within normal limits  ACETAMINOPHEN LEVEL - Abnormal; Notable for the following components:   Acetaminophen (Tylenol), Serum <10 (*)    All other components within normal limits  SALICYLATE LEVEL - Abnormal; Notable for the following components:   Salicylate Lvl <<6.7(*)    All other components within normal limits  RAPID URINE DRUG SCREEN, HOSP PERFORMED - Abnormal; Notable for the following components:   Amphetamines POSITIVE (*)    All other components within normal limits  CBG MONITORING, ED - Abnormal; Notable for the following components:   Glucose-Capillary 196 (*)    All other components within normal limits  MAGNESIUM  ETHANOL    EKG EKG Interpretation  Date/Time:  Saturday June 23 2022 02:44:30 EST Ventricular Rate:  105 PR Interval:  162 QRS Duration: 94 QT Interval:  359 QTC Calculation: 475 R Axis:   36 Text Interpretation: Sinus tachycardia Left atrial enlargement Interpretation limited secondary to artifact Confirmed by WRipley Fraise([phone] on 06/23/2022 2:59:01 AM  Radiology No results found.  Procedures .Critical Care  Performed by: HAntonietta Breach PA-C Authorized by: HAntonietta Breach PA-C   Critical care provider statement:    Critical care time (minutes):   30   Critical care time was exclusive of:  Separately billable procedures and treating other patients   Critical care was necessary to treat or prevent imminent or life-threatening deterioration of the following conditions: overdose.   Critical care was time spent personally by me on the following activities:  Development of treatment plan with patient or surrogate, discussions with consultants, evaluation of patient's response to treatment, examination of patient, ordering and review of laboratory studies, ordering and review of radiographic studies, ordering and performing treatments and interventions, pulse oximetry, re-evaluation of patient's condition and review of old charts   I assumed direction of critical care for  this patient from another provider in my specialty: no       Medications Ordered in ED Medications  insulin aspart (NOVOLOG) FlexPen 15-20 Units (has no administration in time range)  amLODipine (NORVASC) tablet 5 mg (has no administration in time range)  melatonin tablet 5-10 mg (has no administration in time range)  insulin glargine-yfgn (SEMGLEE) injection 40 Units (has no administration in time range)  metFORMIN (GLUCOPHAGE-XR) 24 hr tablet 1,000 mg (has no administration in time range)  sodium chloride 0.9 % bolus 1,000 mL (0 mLs Intravenous Stopped 06/23/22 0518)  sodium chloride 0.9 % bolus 1,000 mL (1,000 mLs Intravenous New Bag/Given 06/23/22 0544)    ED Course/ Medical Decision Making/ A&P Clinical Course as of 06/23/22 0602  Sat Jun 23, 2022  0252 Spoke to [employer] who state to expect potential of anticholinergic response in setting of cough medication OD - observe 6-8 hours. Benzos for seizures. Screen for urinary retention. [KH]  0421 RN states that [employer] called and are planning to take out IVC papers. [KH]  [phone] Patient historically hypertensive at baseline based on chart review.  Feel that his blood pressure during his encounter today is in keeping with  prior values. [KH]  0600 Patient reassessed.  He is awake and alert, much more coherent.  Was using his cell phone prior to my entrance into the exam room.  Confirms taking the medication around midnight with suicidal intent. He verbalizes understanding of the plan for TTS consultation once medically cleared. [KH]    Clinical Course User Index [KH] Antonietta Breach, PA-C                           Medical Decision Making Amount and/or Complexity of Data Reviewed Labs: ordered. ECG/medicine tests: ordered.  Risk OTC drugs. Prescription drug management.   This patient presents to the ED for concern of overdose, this involves an extensive number of treatment options, and is a complaint that carries with it a high risk of complications and morbidity.  The differential diagnosis includes accidental overdose versus intentional overdose/suicide attempt   Co morbidities that complicate the patient evaluation  Hypertension Diabetes Bipolar disorder   Additional history obtained:  Additional history obtained from EMS External records from outside source obtained and reviewed including prior admission for overdose of undetermined intent.   Lab Tests:  I Ordered, and personally interpreted labs.  The pertinent results include:  UDS positive for amphetamines. Transaminitis is chronic and improved compared to prior.   Cardiac Monitoring:  The patient was maintained on a cardiac monitor.  I personally viewed and interpreted the cardiac monitored which showed an underlying rhythm of: sinus tachycardia vs NSR   Medicines ordered and prescription drug management:  I have reviewed the patients home medicines and have made adjustments as needed   Critical Interventions:  Frequent rechecks   Consultations Obtained:  I requested consultation with TTS - assessment and recommendations pending.   Problem List / ED Course:  As above   Reevaluation:  After the interventions noted  above, I reevaluated the patient and found that they have :improved   Social Determinants of Health:  Uninsured    Dispostion:  Patient pending medical clearance as well as TTS evaluation given suicidal ideations and suicide attempt.  He is presently in the emergency department voluntarily.  He has been cooperative since arrival.  Dispo per psychiatric recommendations.  Care to be assumed by oncoming ED provider.  Final Clinical Impression(s) / ED Diagnoses Final diagnoses:  Intentional overdose, initial encounter [facility])    Rx / [place] Orders ED Discharge Orders     None         Antonietta Breach, PA-C 06/23/22 0606    Ripley Fraise, MD 06/23/22 [phone]

## 2022-06-24 LAB — CBC WITH DIFFERENTIAL/PLATELET
Abs Immature Granulocytes: 0.03 10*3/uL (ref 0.00–0.07)
Basophils Absolute: 0.1 10*3/uL (ref 0.0–0.1)
Basophils Relative: 1 %
Eosinophils Absolute: 0.2 10*3/uL (ref 0.0–0.5)
Eosinophils Relative: 4 %
HCT: 39.3 % (ref 39.0–52.0)
Hemoglobin: 13.8 g/dL (ref 13.0–17.0)
Immature Granulocytes: 0 %
Lymphocytes Relative: 34 %
Lymphs Abs: 2.3 10*3/uL (ref 0.7–4.0)
MCH: 31.2 pg (ref 26.0–34.0)
MCHC: 35.1 g/dL (ref 30.0–36.0)
MCV: 88.9 fL (ref 80.0–100.0)
Monocytes Absolute: 0.6 10*3/uL (ref 0.1–1.0)
Monocytes Relative: 8 %
Neutro Abs: 3.6 10*3/uL (ref 1.7–7.7)
Neutrophils Relative %: 53 %
Platelets: 318 10*3/uL (ref 150–400)
RBC: 4.42 MIL/uL (ref 4.22–5.81)
RDW: 12.2 % (ref 11.5–15.5)
WBC: 6.7 10*3/uL (ref 4.0–10.5)
nRBC: 0 % (ref 0.0–0.2)

## 2022-06-24 LAB — CBG MONITORING, ED
Glucose-Capillary: 117 mg/dL — ABNORMAL HIGH (ref 70–99)
Glucose-Capillary: 288 mg/dL — ABNORMAL HIGH (ref 70–99)
Glucose-Capillary: 201 mg/dL — ABNORMAL HIGH (ref 70–99)
Glucose-Capillary: 161 mg/dL — ABNORMAL HIGH (ref 70–99)

## 2022-06-24 LAB — COMPREHENSIVE METABOLIC PANEL
ALT: 90 U/L — ABNORMAL HIGH (ref 0–44)
AST: 76 U/L — ABNORMAL HIGH (ref 15–41)
Albumin: 3.5 g/dL (ref 3.5–5.0)
Alkaline Phosphatase: 67 U/L (ref 38–126)
Anion gap: 6 (ref 5–15)
BUN: 7 mg/dL (ref 6–20)
CO2: 26 mmol/L (ref 22–32)
Calcium: 8.9 mg/dL (ref 8.9–10.3)
Chloride: 109 mmol/L (ref 98–111)
Creatinine, Ser: 0.49 mg/dL — ABNORMAL LOW (ref 0.61–1.24)
GFR, Estimated: >60 mL/min (ref >60)
Glucose, Bld: 107 mg/dL — ABNORMAL HIGH (ref 70–99)
Potassium: 3.5 mmol/L (ref 3.5–5.1)
Sodium: 141 mmol/L (ref 135–145)
Total Bilirubin: 0.6 mg/dL (ref 0.3–1.2)
Total Protein: 6.8 g/dL (ref 6.5–8.1)

## 2022-06-24 NOTE — ED Notes (Signed)
Patient provided his medication Semaglitude (Rebelsus) because pharmacy does not have any. Sent medication (71 tabs) to pharmacy. Medication receipt is in patient's folder. Pharmacy will schedule medication.

## 2022-06-24 NOTE — ED Notes (Signed)
Pt. CBG 201, RN made aware.

## 2022-06-24 NOTE — ED Notes (Signed)
Poison control called and recommended another EKG and repeat AST and ALT labs

## 2022-06-24 NOTE — ED Notes (Signed)
Pt. TTS consult to [facility] clicked off by mistake.

## 2022-06-25 DIAGNOSIS — T50902A Poisoning by unspecified drugs, medicaments and biological substances, intentional self-harm, initial encounter: Secondary | ICD-10-CM

## 2022-06-25 LAB — CBG MONITORING, ED
Glucose-Capillary: 269 mg/dL — ABNORMAL HIGH (ref 70–99)
Glucose-Capillary: 277 mg/dL — ABNORMAL HIGH (ref 70–99)
Glucose-Capillary: 219 mg/dL — ABNORMAL HIGH (ref 70–99)
Glucose-Capillary: 175 mg/dL — ABNORMAL HIGH (ref 70–99)

## 2022-06-25 NOTE — Discharge Instructions (Signed)
Thank you for coming to [facility] Emergency Department. You were seen for intentional overdose of medication. You were treated and evaluated by psychiatry. Please follow up with [facility]. Please also follow up with your primary care provider within 1-2 weeks.   Do not hesitate to return to the ED or call 911 if you experience: -Worsening symptoms of hopelessness, helplessness, depression, or suicidal thoughts -Lightheadedness, passing out -Fevers/chills -Anything else that concerns you
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
## 2022-06-25 NOTE — ED Provider Notes (Signed)
Emergency Medicine Observation Re-evaluation Note  Jason Wolf is a 41 y.o. male, seen on rounds today.  Pt initially presented to the ED for complaints of SA/OD. Currently, the patient is resting comfortably no acute distress.  Physical Exam  BP (!) 141/89   Pulse 76   Temp 98 F (36.7 C) (Oral)   Resp 17   SpO2 96%  Physical Exam General: Appears to be resting comfortably in bed, no acute distress. Cardiac: Regular rate, normal heart rate, non-emergent blood pressure for this morning's vitals. Lungs: No increased work of breathing.  Equal chest rise appreciated Psych: Calm, asleep in bed.   ED Course / MDM  EKG:EKG Interpretation  Date/Time:  Saturday June 23 2022 02:44:30 EST Ventricular Rate:  105 PR Interval:  162 QRS Duration: 94 QT Interval:  359 QTC Calculation: 475 R Axis:   36 Text Interpretation: Sinus tachycardia Left atrial enlargement Interpretation limited secondary to artifact Confirmed by Zadie Rhine ([id number]) on 06/23/2022 2:59:01 AM  I have reviewed the labs performed to date as well as medications administered while in observation..  Plan  Current plan is for psychiatric Inpatient admission.    Glyn Ade, MD 06/25/22 [phone]

## 2022-06-25 NOTE — Progress Notes (Signed)
TOC consulted to provide [facility] resource. Resource provided and attached to AVS.

## 2022-07-03 DIAGNOSIS — Z23 Encounter for immunization: Secondary | ICD-10-CM | POA: Diagnosis not present

## 2022-07-16 DIAGNOSIS — I10 Essential (primary) hypertension: Secondary | ICD-10-CM | POA: Diagnosis not present

## 2022-07-16 DIAGNOSIS — E1165 Type 2 diabetes mellitus with hyperglycemia: Secondary | ICD-10-CM | POA: Diagnosis not present

## 2022-07-16 DIAGNOSIS — E785 Hyperlipidemia, unspecified: Secondary | ICD-10-CM | POA: Diagnosis not present

## 2022-07-17 DIAGNOSIS — F32A Depression, unspecified: Secondary | ICD-10-CM | POA: Diagnosis not present

## 2022-07-17 DIAGNOSIS — G4700 Insomnia, unspecified: Secondary | ICD-10-CM | POA: Diagnosis not present

## 2022-07-17 DIAGNOSIS — F411 Generalized anxiety disorder: Secondary | ICD-10-CM | POA: Diagnosis not present

## 2022-09-07 DIAGNOSIS — I10 Essential (primary) hypertension: Secondary | ICD-10-CM | POA: Diagnosis not present

## 2022-09-07 DIAGNOSIS — E1165 Type 2 diabetes mellitus with hyperglycemia: Secondary | ICD-10-CM | POA: Diagnosis not present

## 2022-09-07 DIAGNOSIS — E785 Hyperlipidemia, unspecified: Secondary | ICD-10-CM | POA: Diagnosis not present

## 2022-10-10 ENCOUNTER — Other Ambulatory Visit (HOSPITAL_BASED_OUTPATIENT_CLINIC_OR_DEPARTMENT_OTHER): Payer: Self-pay

## 2022-10-10 MED ORDER — MOUNJARO 10 MG/0.5ML ~~LOC~~ SOAJ
10.0000 mg | SUBCUTANEOUS | 1 refills | Status: DC
  Filled 2022-10-10: qty 2, 28d supply, fill #0

## 2022-12-05 ENCOUNTER — Other Ambulatory Visit: Payer: Self-pay

## 2022-12-05 ENCOUNTER — Emergency Department (HOSPITAL_BASED_OUTPATIENT_CLINIC_OR_DEPARTMENT_OTHER)
Admission: EM | Admit: 2022-12-05 | Discharge: 2022-12-05 | Disposition: A | Discharge status: Home / Self Care | Payer: BC Managed Care – PPO | Attending: Emergency Medicine | Admitting: Emergency Medicine

## 2022-12-05 ENCOUNTER — Encounter (HOSPITAL_BASED_OUTPATIENT_CLINIC_OR_DEPARTMENT_OTHER): Payer: Self-pay

## 2022-12-05 ENCOUNTER — Emergency Department (HOSPITAL_BASED_OUTPATIENT_CLINIC_OR_DEPARTMENT_OTHER): Payer: BC Managed Care – PPO

## 2022-12-05 DIAGNOSIS — I10 Essential (primary) hypertension: Secondary | ICD-10-CM | POA: Insufficient documentation

## 2022-12-05 DIAGNOSIS — Z23 Encounter for immunization: Secondary | ICD-10-CM | POA: Diagnosis not present

## 2022-12-05 DIAGNOSIS — M79642 Pain in left hand: Secondary | ICD-10-CM | POA: Diagnosis not present

## 2022-12-05 DIAGNOSIS — Z794 Long term (current) use of insulin: Secondary | ICD-10-CM | POA: Diagnosis not present

## 2022-12-05 DIAGNOSIS — Z79899 Other long term (current) drug therapy: Secondary | ICD-10-CM | POA: Insufficient documentation

## 2022-12-05 DIAGNOSIS — L03114 Cellulitis of left upper limb: Secondary | ICD-10-CM | POA: Insufficient documentation

## 2022-12-05 DIAGNOSIS — E119 Type 2 diabetes mellitus without complications: Secondary | ICD-10-CM | POA: Diagnosis not present

## 2022-12-05 DIAGNOSIS — M7989 Other specified soft tissue disorders: Secondary | ICD-10-CM | POA: Diagnosis not present

## 2022-12-05 LAB — CBC WITH DIFFERENTIAL/PLATELET
Abs Immature Granulocytes: 0.03 10*3/uL (ref 0.00–0.07)
Basophils Absolute: 0.1 10*3/uL (ref 0.0–0.1)
Basophils Relative: 1 %
Eosinophils Absolute: 0.1 10*3/uL (ref 0.0–0.5)
Eosinophils Relative: 1 %
HCT: 36.1 % — ABNORMAL LOW (ref 39.0–52.0)
Hemoglobin: 13.3 g/dL (ref 13.0–17.0)
Immature Granulocytes: 0 %
Lymphocytes Relative: 28 %
Lymphs Abs: 2.7 10*3/uL (ref 0.7–4.0)
MCH: 30.7 pg (ref 26.0–34.0)
MCHC: 36.8 g/dL — ABNORMAL HIGH (ref 30.0–36.0)
MCV: 83.4 fL (ref 80.0–100.0)
Monocytes Absolute: 0.5 10*3/uL (ref 0.1–1.0)
Monocytes Relative: 5 %
Neutro Abs: 6.2 10*3/uL (ref 1.7–7.7)
Neutrophils Relative %: 65 %
Platelets: 338 10*3/uL (ref 150–400)
RBC: 4.33 MIL/uL (ref 4.22–5.81)
RDW: 11.9 % (ref 11.5–15.5)
WBC: 9.5 10*3/uL (ref 4.0–10.5)
nRBC: 0 % (ref 0.0–0.2)

## 2022-12-05 LAB — BASIC METABOLIC PANEL
Anion gap: 7 (ref 5–15)
BUN: 5 mg/dL — ABNORMAL LOW (ref 6–20)
CO2: 24 mmol/L (ref 22–32)
Calcium: 8.7 mg/dL — ABNORMAL LOW (ref 8.9–10.3)
Chloride: 109 mmol/L (ref 98–111)
Creatinine, Ser: 0.64 mg/dL (ref 0.61–1.24)
GFR, Estimated: >60 mL/min (ref >60)
Glucose, Bld: 99 mg/dL (ref 70–99)
Potassium: 3.7 mmol/L (ref 3.5–5.1)
Sodium: 140 mmol/L (ref 135–145)

## 2022-12-05 MED ORDER — AMLODIPINE BESYLATE 5 MG PO TABS
5.0000 mg | ORAL_TABLET | Freq: Once | ORAL | Status: AC
  Administered 2022-12-05: 5 mg via ORAL
  Filled 2022-12-05: qty 1

## 2022-12-05 MED ORDER — CLINDAMYCIN HCL 150 MG PO CAPS
300.0000 mg | ORAL_CAPSULE | Freq: Once | ORAL | Status: AC
  Administered 2022-12-05: 300 mg via ORAL
  Filled 2022-12-05: qty 2

## 2022-12-05 MED ORDER — CLINDAMYCIN HCL 300 MG PO CAPS: 300.0000 mg | ORAL_CAPSULE | Freq: Three times a day (TID) | ORAL | 0 refills | Status: AC

## 2022-12-05 NOTE — ED Provider Notes (Signed)
[facility] Provider Note   CSN: [id number] Arrival date & time: 12/05/22  0532     History  Chief Complaint  Patient presents with   Hand Pain    Jason Wolf is a 42 y.o. male.  The history is provided by the patient and medical records.  Hand Pain  Jason Wolf is a 42 y.o. male who presents to the Emergency Department complaining of hand pain.  He presents to the emergency department for evaluation of left hand pain.  He states that 3 days ago he injected meth into the dorsal vein on his left hand.  About 2 days ago he felt like his hand was more swollen over the radial portion of the hand.  He describes it as feeling tight.  He has tingling to the first digit of the left hand that started 24 to 48 hours ago.  No associated fevers, nausea, vomiting, difficulty breathing, chest pain.  He does have a history of hypertension and diabetes.  He is compliant with his home medications.  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  He did miss his blood pressure medications today.     Home Medications Prior to Admission medications   Medication Sig Start Date End Date Taking? Authorizing Provider  clindamycin (CLEOCIN) 300 MG capsule Take 1 capsule (300 mg total) by mouth 3 (three) times daily for 7 days. 12/05/22 12/12/22 Yes Tilden Fossa, MD  Blood Glucose Monitoring Suppl (TRUE METRIX METER) w/Device KIT Use as instructed. Check blood glucose level by fingerstick 3-5 times per day. 06/29/20   Claiborne Rigg, NP  Calcium Carbonate Antacid (TUMS CHEWY DELIGHTS PO) Take 2 tablets by mouth daily as needed (heartburn).    [provider]  Continuous Blood Gluc Sensor (FREESTYLE LIBRE 3 SENSOR) MISC Change sensor every 14 days. 05/18/22     glucose monitoring kit (FREESTYLE) monitoring kit 1 each by Does not apply route 4 (four) times daily - after meals and at bedtime. 1 month Diabetic Testing Supplies for QAC-QHS accuchecks. 02/23/20    Ghimire, Werner Lean, MD  insulin aspart (NOVOLOG FLEXPEN) 100 UNIT/ML FlexPen Inject 15-20 Units into the skin three times daily and correctional scale as directed. 05/18/22     insulin glargine (SEMGLEE) 100 UNIT/ML injection Inject 50 Units into the skin at bedtime.    [provider]  Insulin Pen Needle (PEN NEEDLES) 32G X 4 MM MISC Use as instructed. Inject into the skin 5 times per day 06/29/20   Claiborne Rigg, NP  losartan (COZAAR) 25 MG tablet Take 25 mg by mouth daily.    [provider]  melatonin 5 MG TABS Take 5-10 mg by mouth at bedtime.    [provider]  tirzepatide Greggory Keen) 2.5 MG/0.5ML Pen Inject 2.5 mg into the skin once a week. 05/18/22         Allergies    Bee venom, Wasp venom, Yellow jacket venom, and Codeine    Review of Systems   Review of Systems  All other systems reviewed and are negative.   Physical Exam Updated Vital Signs BP (!) 144/81   Pulse (!) 101   Temp 98.2 F (36.8 C) (Oral)   Resp 18   Ht 5\' 11"  (1.803 m)   Wt 108.9 kg   SpO2 100%   BMI 33.47 kg/m  Physical Exam Vitals and nursing note reviewed.  Constitutional:      Appearance: He is well-developed.  HENT:  Head: Normocephalic and atraumatic.  Cardiovascular:     Rate and Rhythm: Normal rate and regular rhythm.  Pulmonary:     Effort: Pulmonary effort is normal. No respiratory distress.  Abdominal:     Palpations: Abdomen is soft.     Tenderness: There is no abdominal tenderness. There is no guarding or rebound.  Musculoskeletal:     Comments: There is mild erythema and soft tissue swelling over the left first metacarpal and a small amount over the second metacarpal.  There is tenderness to palpation throughout this region that is mild in nature.  There is no palpable fluid collection.  He is able to fully flex and extend throughout all the digits against resistance.  2+ radial pulses bilaterally.  Skin:    General: Skin is warm and dry.   Neurological:     Mental Status: He is alert and oriented to person, place, and time.     Comments: 5 out of 5 strength in all the muscles of the hand and wrist.  He does have altered sensation to light touch throughout the left first digit but he is able to feel throughout the first digit.  Psychiatric:        Behavior: Behavior normal.     ED Results / Procedures / Treatments   Labs (all labs ordered are listed, but only abnormal results are displayed) Labs Reviewed  BASIC METABOLIC PANEL - Abnormal; Notable for the following components:      Result Value   BUN 5 (*)    Calcium 8.7 (*)    All other components within normal limits  CBC WITH DIFFERENTIAL/PLATELET - Abnormal; Notable for the following components:   HCT 36.1 (*)    MCHC 36.8 (*)    All other components within normal limits    EKG None  Radiology DG Hand Complete Left  Result Date: 12/05/2022 CLINICAL DATA:  Pain after injecting math 3 days ago. EXAM: LEFT HAND - COMPLETE 3+ VIEW COMPARISON:  None Available. FINDINGS: There is no evidence of fracture or dislocation. There is no evidence of arthropathy or other focal bone abnormality. Dorsal soft tissue swelling identified overlying the metacarpal bones. IMPRESSION: 1. No acute bone abnormality. 2. Dorsal soft tissue swelling. Electronically Signed   By: Signa Kell M.D.   On: 12/05/2022 06:16    Procedures Procedures    Medications Ordered in ED Medications  amLODipine (NORVASC) tablet 5 mg (5 mg Oral Given 12/05/22 0608)  clindamycin (CLEOCIN) capsule 300 mg (300 mg Oral Given 12/05/22 1191)    ED Course/ Medical Decision Making/ A&P                             Medical Decision Making Amount and/or Complexity of Data Reviewed Labs: ordered. Radiology: ordered.  Risk Prescription drug management.   Patient with history of methamphetamine use here for evaluation of pain and swelling to his left hand after injecting into that hand.  He does have some  erythema and soft tissue swelling throughout the radial aspect of the dorsal left hand without any evidence of infection.  Unable to visualize where patient injected as there are no skin wounds but he points to the medial dorsal hand.  There is no evidence of foreign body on imaging-images personally reviewed and interpreted, agree with radiologist interpretation.  He was significantly hypertensive at time of ED presentation but he did miss his home medication-his blood pressure was improved after administration  of his home meds.  Will start on antibiotics for likely cellulitis.  Discussed close return precautions if he has new or concerning symptoms.        Final Clinical Impression(s) / ED Diagnoses Final diagnoses:  Cellulitis of left upper extremity    Rx / DC Orders ED Discharge Orders          Ordered    clindamycin (CLEOCIN) 300 MG capsule  3 times daily        12/05/22 0650              Tilden Fossa, MD 12/05/22 [phone]

## 2022-12-05 NOTE — ED Triage Notes (Signed)
Pt states he shot up meth on the top of his LT hand 3 days ago and it is now swollen. Sensation intact with tingling. Pt denies fevers, SOB, or other issues.

## 2022-12-13 DIAGNOSIS — R809 Proteinuria, unspecified: Secondary | ICD-10-CM | POA: Diagnosis not present

## 2022-12-13 DIAGNOSIS — B182 Chronic viral hepatitis C: Secondary | ICD-10-CM | POA: Diagnosis not present

## 2022-12-13 DIAGNOSIS — I10 Essential (primary) hypertension: Secondary | ICD-10-CM | POA: Diagnosis not present

## 2022-12-13 DIAGNOSIS — R7401 Elevation of levels of liver transaminase levels: Secondary | ICD-10-CM | POA: Diagnosis not present

## 2022-12-13 DIAGNOSIS — E1165 Type 2 diabetes mellitus with hyperglycemia: Secondary | ICD-10-CM | POA: Diagnosis not present

## 2022-12-13 DIAGNOSIS — K760 Fatty (change of) liver, not elsewhere classified: Secondary | ICD-10-CM | POA: Diagnosis not present

## 2023-01-22 ENCOUNTER — Other Ambulatory Visit: Payer: Self-pay

## 2023-01-22 ENCOUNTER — Encounter (HOSPITAL_BASED_OUTPATIENT_CLINIC_OR_DEPARTMENT_OTHER): Payer: Self-pay | Admitting: Emergency Medicine

## 2023-01-22 ENCOUNTER — Emergency Department (HOSPITAL_BASED_OUTPATIENT_CLINIC_OR_DEPARTMENT_OTHER)
Admission: EM | Admit: 2023-01-22 | Discharge: 2023-01-22 | Disposition: A | Discharge status: Home / Self Care | Payer: BC Managed Care – PPO | Attending: Emergency Medicine | Admitting: Emergency Medicine

## 2023-01-22 DIAGNOSIS — Z79899 Other long term (current) drug therapy: Secondary | ICD-10-CM | POA: Insufficient documentation

## 2023-01-22 DIAGNOSIS — R000 Tachycardia, unspecified: Secondary | ICD-10-CM | POA: Diagnosis not present

## 2023-01-22 DIAGNOSIS — F17210 Nicotine dependence, cigarettes, uncomplicated: Secondary | ICD-10-CM | POA: Insufficient documentation

## 2023-01-22 DIAGNOSIS — Z794 Long term (current) use of insulin: Secondary | ICD-10-CM | POA: Insufficient documentation

## 2023-01-22 DIAGNOSIS — E1165 Type 2 diabetes mellitus with hyperglycemia: Secondary | ICD-10-CM | POA: Diagnosis not present

## 2023-01-22 DIAGNOSIS — I10 Essential (primary) hypertension: Secondary | ICD-10-CM | POA: Insufficient documentation

## 2023-01-22 DIAGNOSIS — R739 Hyperglycemia, unspecified: Secondary | ICD-10-CM

## 2023-01-22 DIAGNOSIS — T483X4A Poisoning by antitussives, undetermined, initial encounter: Secondary | ICD-10-CM | POA: Diagnosis not present

## 2023-01-22 HISTORY — DX: Type 2 diabetes mellitus without complications: E11.9

## 2023-01-22 LAB — RAPID URINE DRUG SCREEN, HOSP PERFORMED
Amphetamines: NOT DETECTED
Barbiturates: NOT DETECTED
Benzodiazepines: NOT DETECTED
Cocaine: NOT DETECTED
Opiates: NOT DETECTED
Tetrahydrocannabinol: NOT DETECTED

## 2023-01-22 LAB — CBG MONITORING, ED: Glucose-Capillary: 302 mg/dL — ABNORMAL HIGH (ref 70–99)

## 2023-01-22 MED ORDER — SODIUM CHLORIDE 0.9 % IV BOLUS: 1000.0000 mL | Freq: Once | INTRAVENOUS | Status: DC

## 2023-01-22 MED ORDER — INSULIN ASPART 100 UNIT/ML IJ SOLN
5.0000 [IU] | Freq: Once | INTRAMUSCULAR | Status: AC
  Administered 2023-01-22: 5 [IU] via SUBCUTANEOUS

## 2023-01-22 NOTE — ED Provider Notes (Addendum)
[facility] Provider Note: Jason Dell, MD, FACEP  CSN: [id number] MRN: [id number] ARRIVAL: 01/22/23 at 0035 ROOM: [facility]   CHIEF COMPLAINT  Drug Overdose   HISTORY OF PRESENT ILLNESS  01/22/23 1:10 AM Jason Wolf is a 42 y.o. male who took 9, 30 mg dextromethorphan tablets about 9:30 PM.  He did this in an attempt to get high.  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  He denies any suicidal or homicidal ideations.  He denies any auditory or visual hallucinations.  He is having no muscle pain.  He has no acute complaint apart from feeling sleepy.  He is on Zoloft, and trazodone as needed.  He did take 1 trazodone yesterday.  Poison control was contacted.  They advised observation for 6 hours from the time of the ingestion.   Past Medical History:  Diagnosis Date   Bipolar disorder (HCC)    DM (diabetes mellitus) (HCC)    ETOH abuse    Hypertension    Opiate abuse, episodic (HCC)     Past Surgical History:  Procedure Laterality Date   I & D EXTREMITY Right 02/20/2020   Procedure: IRRIGATION AND DEBRIDEMENT HAND;  Surgeon: Ernest Mallick, MD;  Location: [facility] OR;  Service: Orthopedics;  Laterality: Right;   KNEE ARTHROSCOPY      Family History  Problem Relation Age of Onset   Diabetes type II Mother    Diabetes Mother     Social History   Tobacco Use   Smoking status: Every Day    Packs/day: 1    Types: Cigarettes   Smokeless tobacco: Never  Vaping Use   Vaping Use: Every day  Substance Use Topics   Alcohol use: No    Comment: in rehab   Drug use: Yes    Types: Methamphetamines    Comment: was in rehab and relapsed after 22 months. 02/19/20    Prior to Admission medications   Medication Sig Start Date End Date Taking? Authorizing Provider  Blood Glucose Monitoring Suppl (TRUE METRIX METER) w/Device KIT Use as instructed. Check blood glucose level by fingerstick 3-5 times per day. 06/29/20   Claiborne Rigg, NP  Calcium Carbonate  Antacid (TUMS CHEWY DELIGHTS PO) Take 2 tablets by mouth daily as needed (heartburn).    [provider]  Continuous Blood Gluc Sensor (FREESTYLE LIBRE 3 SENSOR) MISC Change sensor every 14 days. 05/18/22     glucose monitoring kit (FREESTYLE) monitoring kit 1 each by Does not apply route 4 (four) times daily - after meals and at bedtime. 1 month Diabetic Testing Supplies for QAC-QHS accuchecks. 02/23/20   Ghimire, Werner Lean, MD  insulin aspart (NOVOLOG FLEXPEN) 100 UNIT/ML FlexPen Inject 15-20 Units into the skin three times daily and correctional scale as directed. 05/18/22     insulin glargine (SEMGLEE) 100 UNIT/ML injection Inject 50 Units into the skin at bedtime.    [provider]  Insulin Pen Needle (PEN NEEDLES) 32G X 4 MM MISC Use as instructed. Inject into the skin 5 times per day 06/29/20   Claiborne Rigg, NP  losartan (COZAAR) 25 MG tablet Take 25 mg by mouth daily.    [provider]  melatonin 5 MG TABS Take 5-10 mg by mouth at bedtime.    [provider]  tirzepatide Greggory Keen) 2.5 MG/0.5ML Pen Inject 2.5 mg into the skin once a week. 05/18/22       Allergies Bee venom, Wasp venom, Yellow jacket venom, and Codeine  REVIEW OF SYSTEMS  Negative except as noted here or in the History of Present Illness.   PHYSICAL EXAMINATION  Initial Vital Signs Blood pressure (!) 146/97, pulse (!) 107, temperature 98.9 F (37.2 C), resp. rate 20, height 5\' 11"  (1.803 m), weight 116.6 kg, SpO2 98 %.  Examination General: Well-developed, well-nourished male in no acute distress; appearance consistent with age of record HENT: normocephalic; atraumatic Eyes: pupils equal, round and reactive to light; mild rotatory nystagmus Neck: supple Heart: regular rate and rhythm Lungs: clear to auscultation bilaterally Abdomen: soft; nondistended; nontender; bowel sounds present Extremities: No deformity; full range of motion; pulses normal Neurologic: Awake, alert  and oriented; motor function intact in all extremities and symmetric; no facial droop Skin: Clammy Psychiatric: Flat affect   RESULTS  Summary of this visit's results, reviewed and interpreted by myself:   EKG Interpretation  Date/Time:  Tuesday January 22 2023 00:46:31 EDT Ventricular Rate:  105 PR Interval:  153 QRS Duration: 107 QT Interval:  373 QTC Calculation: 493 R Axis:   13 Text Interpretation: Sinus tachycardia Abnormal R-wave progression, early transition Borderline T abnormalities, inferior leads Borderline prolonged QT interval No significant change was found Confirmed by Paula Libra ([id number]) on 01/22/2023 12:52:35 AM       Laboratory Studies: Results for orders placed or performed during the hospital encounter of 01/22/23 (from the past 24 hour(s))  CBG monitoring, ED     Status: Abnormal   Collection Time: 01/22/23 12:42 AM  Result Value Ref Range   Glucose-Capillary 302 (H) 70 - 99 mg/dL   Comment 1 Notify RN   Rapid urine drug screen (hospital performed)     Status: None   Collection Time: 01/22/23  2:14 AM  Result Value Ref Range   Opiates NONE DETECTED NONE DETECTED   Cocaine NONE DETECTED NONE DETECTED   Benzodiazepines NONE DETECTED NONE DETECTED   Amphetamines NONE DETECTED NONE DETECTED   Tetrahydrocannabinol NONE DETECTED NONE DETECTED   Barbiturates NONE DETECTED NONE DETECTED   Imaging Studies: No results found.  ED COURSE and MDM  Nursing notes, initial and subsequent vitals signs, including pulse oximetry, reviewed and interpreted by myself.  Vitals:   01/22/23 0041 01/22/23 0059 01/22/23 0145  BP: (!) 146/97  (!) 151/91  Pulse: (!) 107  93  Resp: 20  15  Temp: 98.9 F (37.2 C)    SpO2: 98%  99%  Weight:  116.6 kg   Height:  5\' 11"  (1.803 m)    Medications  sodium chloride 0.9 % bolus 1,000 mL (has no administration in time range)  insulin aspart (novoLOG) injection 5 Units (5 Units Subcutaneous Given 01/22/23 0210)   The patient's  drowsiness and rotatory nystagmus are consistent with dextromethorphan overdose.  It has ketamine like properties and large doses.  He does have some risk for serotonin syndrome given that he is also on antidepressants but has no muscle pain, rigidity or hyperthermia.  3:28 AM Patient now awake and alert.  No nystagmus noted.  Nursing staff was unable to secure IV access or draw blood.  He he has been hydrated orally and was given subcu insulin for his hyperglycemia.  He was advised of the dangers not only inherently with overdosing on dextromethorphan but also in his case the risk of serotonin syndrome as he is on other serotonergic drugs.  He continues to deny suicidal intent and states he took this recreationally.  PROCEDURES  Procedures   ED DIAGNOSES     ICD-10-CM  1. Dextromethorphan overdose, undetermined intent, initial encounter  T48.3X4A     2. Hyperglycemia  R73.9          Yukio Bisping, Jonny Ruiz, MD 01/22/23 4742    Paula Libra, MD 01/22/23 [phone]

## 2023-01-22 NOTE — ED Triage Notes (Signed)
Pt POV with father-  Father reports pt took 11 coricedin appx 2130, pt denies SI/HI. Denies AVH.

## 2023-03-19 ENCOUNTER — Other Ambulatory Visit: Payer: Self-pay

## 2023-04-12 DIAGNOSIS — F39 Unspecified mood [affective] disorder: Secondary | ICD-10-CM | POA: Diagnosis not present

## 2023-04-15 DIAGNOSIS — E1165 Type 2 diabetes mellitus with hyperglycemia: Secondary | ICD-10-CM | POA: Diagnosis not present

## 2023-04-18 ENCOUNTER — Emergency Department (HOSPITAL_COMMUNITY): Payer: BC Managed Care – PPO

## 2023-04-18 ENCOUNTER — Encounter (HOSPITAL_COMMUNITY): Payer: Self-pay | Admitting: Emergency Medicine

## 2023-04-18 ENCOUNTER — Emergency Department (HOSPITAL_COMMUNITY)
Admission: EM | Admit: 2023-04-18 | Discharge: 2023-04-18 | Disposition: A | Discharge status: Home / Self Care | Payer: BC Managed Care – PPO | Attending: Emergency Medicine | Admitting: Emergency Medicine

## 2023-04-18 DIAGNOSIS — Z794 Long term (current) use of insulin: Secondary | ICD-10-CM | POA: Diagnosis not present

## 2023-04-18 DIAGNOSIS — E876 Hypokalemia: Secondary | ICD-10-CM | POA: Insufficient documentation

## 2023-04-18 DIAGNOSIS — I10 Essential (primary) hypertension: Secondary | ICD-10-CM | POA: Diagnosis not present

## 2023-04-18 DIAGNOSIS — R0789 Other chest pain: Secondary | ICD-10-CM | POA: Diagnosis not present

## 2023-04-18 DIAGNOSIS — E119 Type 2 diabetes mellitus without complications: Secondary | ICD-10-CM | POA: Insufficient documentation

## 2023-04-18 DIAGNOSIS — Z79899 Other long term (current) drug therapy: Secondary | ICD-10-CM | POA: Diagnosis not present

## 2023-04-18 DIAGNOSIS — Z20822 Contact with and (suspected) exposure to covid-19: Secondary | ICD-10-CM | POA: Insufficient documentation

## 2023-04-18 DIAGNOSIS — R079 Chest pain, unspecified: Secondary | ICD-10-CM | POA: Diagnosis not present

## 2023-04-18 DIAGNOSIS — R0602 Shortness of breath: Secondary | ICD-10-CM | POA: Diagnosis not present

## 2023-04-18 DIAGNOSIS — R0600 Dyspnea, unspecified: Secondary | ICD-10-CM | POA: Diagnosis not present

## 2023-04-18 DIAGNOSIS — B349 Viral infection, unspecified: Secondary | ICD-10-CM | POA: Diagnosis not present

## 2023-04-18 LAB — TROPONIN I (HIGH SENSITIVITY)
Troponin I (High Sensitivity): 7 ng/L (ref <18)
Troponin I (High Sensitivity): 6 ng/L (ref <18)

## 2023-04-18 LAB — CBC
HCT: 41.9 % (ref 39.0–52.0)
Hemoglobin: 15.1 g/dL (ref 13.0–17.0)
MCH: 30.7 pg (ref 26.0–34.0)
MCHC: 36 g/dL (ref 30.0–36.0)
MCV: 85.2 fL (ref 80.0–100.0)
Platelets: 414 10*3/uL — ABNORMAL HIGH (ref 150–400)
RBC: 4.92 MIL/uL (ref 4.22–5.81)
RDW: 12.1 % (ref 11.5–15.5)
WBC: 8.2 10*3/uL (ref 4.0–10.5)
nRBC: 0 % (ref 0.0–0.2)

## 2023-04-18 LAB — BASIC METABOLIC PANEL
Anion gap: 13 (ref 5–15)
BUN: <5 mg/dL — ABNORMAL LOW (ref 6–20)
CO2: 21 mmol/L — ABNORMAL LOW (ref 22–32)
Calcium: 9.3 mg/dL (ref 8.9–10.3)
Chloride: 106 mmol/L (ref 98–111)
Creatinine, Ser: 0.64 mg/dL (ref 0.61–1.24)
GFR, Estimated: >60 mL/min (ref >60)
Glucose, Bld: 79 mg/dL (ref 70–99)
Potassium: 3.2 mmol/L — ABNORMAL LOW (ref 3.5–5.1)
Sodium: 140 mmol/L (ref 135–145)

## 2023-04-18 LAB — RESP PANEL BY RT-PCR (RSV, FLU A&B, COVID)  RVPGX2
Influenza A by PCR: NEGATIVE
Influenza B by PCR: NEGATIVE
Resp Syncytial Virus by PCR: NEGATIVE
SARS Coronavirus 2 by RT PCR: NEGATIVE

## 2023-04-18 LAB — SARS CORONAVIRUS 2 BY RT PCR: SARS Coronavirus 2 by RT PCR: NEGATIVE

## 2023-04-18 MED ORDER — POTASSIUM CHLORIDE CRYS ER 20 MEQ PO TBCR
40.0000 meq | EXTENDED_RELEASE_TABLET | Freq: Once | ORAL | Status: AC
  Administered 2023-04-18: 40 meq via ORAL
  Filled 2023-04-18: qty 2

## 2023-04-18 MED ORDER — ALBUTEROL SULFATE HFA 108 (90 BASE) MCG/ACT IN AERS
2.0000 | INHALATION_SPRAY | Freq: Once | RESPIRATORY_TRACT | Status: AC
  Administered 2023-04-18: 2 via RESPIRATORY_TRACT
  Filled 2023-04-18: qty 6.7

## 2023-04-18 NOTE — ED Provider Triage Note (Signed)
Emergency Medicine Provider Triage Evaluation Note  Jason Wolf , a 42 y.o. male  was evaluated in triage.  Pt complains of chest tightness.  Started feeling fatigued yesterday and then today has cough, pain with swallowing, chest tightness and shortness of breath.  Review of Systems  Positive: Shortness of breath, cough, chest tightness Negative: Fever  Physical Exam  BP (!) 147/86   Pulse 95   Temp (!) 97.5 F (36.4 C) (Oral)   Resp 18   Ht 5\' 11"  (1.803 m)   Wt 116 kg   SpO2 100%   BMI 35.67 kg/m  Gen:   Awake, no distress   Resp:  Normal effort, no wheezing MSK:   Moves extremities without difficulty Other:  Heart regular rate and rhythm  Medical Decision Making  Medically screening exam initiated at 10:40 AM.  Appropriate orders placed.  Jason Wolf was informed that the remainder of the evaluation will be completed by another provider, this initial triage assessment does not replace that evaluation, and the importance of remaining in the ED until their evaluation is complete.  EKG benign compared to baseline.  Troponin sent as well as will get chest x-ray and COVID swab.   Pricilla Loveless, MD 04/18/23 1041

## 2023-04-18 NOTE — Discharge Instructions (Signed)
You have been seen today for your complaint of chest pain, shortness of breath. Your lab work was reassuring. Your imaging was reassuring. Your discharge medications include albuterol inhaler.  This has been sent home with you.  Take 1 puff every 6 hours as needed for shortness of breath and wheezing. Follow up with: Your PCP in 1 week for reevaluation Please seek immediate medical care if you develop any of the following symptoms: Your chest pain gets worse. You have a cough that gets worse, or you cough up blood. You have severe pain in your abdomen. You faint. You have sudden, unexplained chest discomfort. You have sudden, unexplained discomfort in your arms, back, neck, or jaw. You have shortness of breath at any time. You suddenly start to sweat, or your skin gets clammy. You feel nausea or you vomit. You suddenly feel lightheaded or dizzy. You have severe weakness, or unexplained weakness or fatigue. Your heart begins to beat quickly, or it feels like it is skipping beats. At this time there does not appear to be the presence of an emergent medical condition, however there is always the potential for conditions to change. Please read and follow the below instructions.  Do not take your medicine if  develop an itchy rash, swelling in your mouth or lips, or difficulty breathing; call 911 and seek immediate emergency medical attention if this occurs.  You may review your lab tests and imaging results in their entirety on your MyChart account.  Please discuss all results of fully with your primary care provider and other specialist at your follow-up visit.  Note: Portions of this text may have been transcribed using voice recognition software. Every effort was made to ensure accuracy; however, inadvertent computerized transcription errors may still be present.

## 2023-04-18 NOTE — ED Triage Notes (Signed)
Pt arrives via EMS with central chest tightness and SOB since last night. EMS gave 324 ASA and 1 nitroglycerin with minimal changes. 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 Cough yesterday.

## 2023-04-18 NOTE — ED Provider Notes (Signed)
[place] [facility] AT [facility] Provider Note   CSN: [id number] Arrival date & time: 04/18/23  1024     History  Chief Complaint  Patient presents with   Chest Pain    Jason Wolf is a 42 y.o. male.  With history of hypertension, diabetes, bipolar disorder presenting for evaluation of shortness of breath, cough, congestion, chest tightness.  He began to feel poorly yesterday afternoon.  He states he woke up with chest tightness and some difficulty breathing.  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  Chest tightness is localized across the anterior of the chest.  It is not worse with activity.  No radiation.  No associated nausea, vomiting or diaphoresis.  Reports hypertension but no history of CAD.  States he feels like he has the flu.  He was given nitroglycerin and aspirin by EMS with no improvement in his symptoms.   Chest Pain Associated symptoms: cough and shortness of breath        Home Medications Prior to Admission medications   Medication Sig Start Date End Date Taking? Authorizing Provider  Blood Glucose Monitoring Suppl (TRUE METRIX METER) w/Device KIT Use as instructed. Check blood glucose level by fingerstick 3-5 times per day. 06/29/20   Claiborne Rigg, NP  Calcium Carbonate Antacid (TUMS CHEWY DELIGHTS PO) Take 2 tablets by mouth daily as needed (heartburn).    [provider]  Continuous Blood Gluc Sensor (FREESTYLE LIBRE 3 SENSOR) MISC Change sensor every 14 days. 05/18/22     glucose monitoring kit (FREESTYLE) monitoring kit 1 each by Does not apply route 4 (four) times daily - after meals and at bedtime. 1 month Diabetic Testing Supplies for QAC-QHS accuchecks. 02/23/20   Ghimire, Werner Lean, MD  insulin aspart (NOVOLOG FLEXPEN) 100 UNIT/ML FlexPen Inject 15-20 Units into the skin three times daily and correctional scale as directed. 05/18/22     insulin glargine (SEMGLEE) 100 UNIT/ML  injection Inject 50 Units into the skin at bedtime.    [provider]  Insulin Pen Needle (PEN NEEDLES) 32G X 4 MM MISC Use as instructed. Inject into the skin 5 times per day 06/29/20   Claiborne Rigg, NP  losartan (COZAAR) 25 MG tablet Take 25 mg by mouth daily.    [provider]  melatonin 5 MG TABS Take 5-10 mg by mouth at bedtime.    [provider]  tirzepatide Greggory Keen) 2.5 MG/0.5ML Pen Inject 2.5 mg into the skin once a week. 05/18/22         Allergies    Bee venom, Wasp venom, Yellow jacket venom, and Codeine    Review of Systems   Review of Systems  HENT:  Positive for congestion.   Respiratory:  Positive for cough and shortness of breath.   Cardiovascular:  Positive for chest pain.  All other systems reviewed and are negative.   Physical Exam Updated Vital Signs BP (!) 140/90   Pulse 84   Temp 98.2 F (36.8 C) (Oral)   Resp 17   Ht 5\' 11"  (1.803 m)   Wt 116 kg   SpO2 100%   BMI 35.67 kg/m  Physical Exam Vitals and nursing note reviewed.  Constitutional:      General: He is not in acute distress.    Appearance: He is well-developed. He is not ill-appearing, toxic-appearing or diaphoretic.     Comments: Resting comfortably in bed  HENT:  Head: Normocephalic and atraumatic.     Mouth/Throat:     Comments: Edentulous Eyes:     Conjunctiva/sclera: Conjunctivae normal.  Cardiovascular:     Rate and Rhythm: Normal rate and regular rhythm.     Heart sounds: No murmur heard. Pulmonary:     Effort: Pulmonary effort is normal. No respiratory distress.     Breath sounds: Normal breath sounds. No decreased breath sounds, wheezing, rhonchi or rales.  Abdominal:     Palpations: Abdomen is soft.     Tenderness: There is no abdominal tenderness.  Musculoskeletal:        General: No swelling.     Cervical back: Neck supple.     Right lower leg: No edema.     Left lower leg: No edema.  Skin:    General: Skin is warm and dry.      Capillary Refill: Capillary refill takes less than 2 seconds.  Neurological:     General: No focal deficit present.     Mental Status: He is alert and oriented to person, place, and time.  Psychiatric:        Mood and Affect: Mood normal.     ED Results / Procedures / Treatments   Labs (all labs ordered are listed, but only abnormal results are displayed) Labs Reviewed  CBC - Abnormal; Notable for the following components:      Result Value   Platelets 414 (*)    All other components within normal limits  BASIC METABOLIC PANEL - Abnormal; Notable for the following components:   Potassium 3.2 (*)    CO2 21 (*)    BUN <5 (*)    All other components within normal limits  RESP PANEL BY RT-PCR (RSV, FLU A&B, COVID)  [id number]  SARS CORONAVIRUS 2 BY RT PCR  TROPONIN I (HIGH SENSITIVITY)  TROPONIN I (HIGH SENSITIVITY)    EKG EKG Interpretation Date/Time:  Thursday April 18 2023 10:26:35 EDT Ventricular Rate:  93 PR Interval:  146 QRS Duration:  88 QT Interval:  416 QTC Calculation: 517 R Axis:   47  Text Interpretation: Normal sinus rhythm Prolonged QT  nonspecific changes similar to June 2024 Confirmed by Pricilla Loveless [id number]) on 04/18/2023 10:29:17 AM  Radiology DG Chest 2 View  Result Date: 04/18/2023 CLINICAL DATA:  Shortness of breath.  Chest pain. EXAM: CHEST - 2 VIEW COMPARISON:  Chest radiographs 02/19/2020 FINDINGS: Cardiac silhouette and mediastinal contours are within normal limits. The lungs are clear. No pulmonary edema, pleural effusion, or pneumothorax. Mild-to-moderate multilevel degenerative disc changes of the thoracic spine. IMPRESSION: No active cardiopulmonary disease. Electronically Signed   By: Neita Garnet M.D.   On: 04/18/2023 12:53    Procedures Procedures    Medications Ordered in ED Medications  albuterol (VENTOLIN HFA) 108 (90 Base) MCG/ACT inhaler 2 puff (2 puffs Inhalation Given 04/18/23 1704)  potassium chloride SA (KLOR-CON M) CR tablet  40 mEq (40 mEq Oral Given 04/18/23 1704)    ED Course/ Medical Decision Making/ A&P             HEART Score: 1                    Medical Decision Making Amount and/or Complexity of Data Reviewed Labs: ordered. Radiology: ordered.  Risk Prescription drug management.   This patient presents to the ED for concern of chest tightness, shortness of breath, this involves an extensive number of treatment options, and is a complaint that carries with  it a high risk of complications and morbidity.  The emergent differential diagnosis of chest pain includes: Acute coronary syndrome, pericarditis, aortic dissection, pulmonary embolism, tension pneumothorax, and esophageal rupture.  I do not believe the patient has an emergent cause of chest pain, other urgent/non-acute considerations include, but are not limited to: chronic angina, aortic stenosis, cardiomyopathy, myocarditis, mitral valve prolapse, pulmonary hypertension, hypertrophic obstructive cardiomyopathy (HOCM), aortic insufficiency, right ventricular hypertrophy, pneumonia, pleuritis, bronchitis, pneumothorax, tumor, gastroesophageal reflux disease (GERD), esophageal spasm, Mallory-Weiss syndrome, peptic ulcer disease, biliary disease, pancreatitis, functional gastrointestinal pain, cervical or thoracic disk disease or arthritis, shoulder arthritis, costochondritis, subacromial bursitis, anxiety or panic attack, herpes zoster, breast disorders, chest wall tumors, thoracic outlet syndrome, mediastinitis.  My initial workup includes ACS rule out  Additional history obtained from: Nursing notes from this visit. EMS provides a portion of the history  I ordered, reviewed and interpreted labs which include: CBC, BMP, troponin, delta troponin, respiratory panel.  Negative initial and delta troponin.  Hypokalemia of 3.2.  Labs otherwise reassuring  I ordered imaging studies including chest x-ray I independently visualized and interpreted imaging  which showed negative I agree with the radiologist interpretation  Cardiac Monitoring:  The patient was maintained on a cardiac monitor.  I personally viewed and interpreted the cardiac monitored which showed an underlying rhythm of: NSR  Afebrile, hemodynamically stable.  42 year old male presenting for evaluation of chest pain and shortness of breath.  Pain is described as a tightness across the anterior of the chest.  It began this morning.  EKG was without acute ischemic changes.  Chest x-ray normal.  Initial and delta troponin negative.  Heart score of 1 and I have low suspicion for ACS as the cause of his symptoms.  PE was on the differential as well but considered significantly less likely given his lack of risk factors.  He is also reporting some congestion and rhinorrhea.  He has known sick contacts.  Overall suspect a viral illness as the cause of his symptoms.  He reported improvement in his symptoms after albuterol inhaler.  This will be sent home with him for use on an as-needed basis.  Hypokalemia was treated in the ED as well.  Patient was encouraged to follow-up with his PCP in 1 week for reevaluation.  He was given strict return precautions.  Stable at discharge.  At this time there does not appear to be any evidence of an acute emergency medical condition and the patient appears stable for discharge with appropriate outpatient follow up. Diagnosis was discussed with patient who verbalizes understanding of care plan and is agreeable to discharge. I have discussed return precautions with patient who verbalizes understanding. Patient encouraged to follow-up with their PCP within 1 week. All questions answered.  Note: Portions of this report may have been transcribed using voice recognition software. Every effort was made to ensure accuracy; however, inadvertent computerized transcription errors may still be present.        Final Clinical Impression(s) / ED Diagnoses Final  diagnoses:  Viral illness  Atypical chest pain    Rx / DC Orders ED Discharge Orders     None         Michelle Piper, Cordelia Poche 04/18/23 1810    Arby Barrette, MD 04/20/23 0040

## 2023-04-30 ENCOUNTER — Other Ambulatory Visit: Payer: Self-pay

## 2023-04-30 ENCOUNTER — Emergency Department (HOSPITAL_COMMUNITY)
Admission: EM | Admit: 2023-04-30 | Discharge: 2023-04-30 | Disposition: A | Discharge status: Home / Self Care | Payer: BC Managed Care – PPO | Attending: Emergency Medicine | Admitting: Emergency Medicine

## 2023-04-30 DIAGNOSIS — T485X2A Poisoning by other anti-common-cold drugs, intentional self-harm, initial encounter: Secondary | ICD-10-CM | POA: Insufficient documentation

## 2023-04-30 DIAGNOSIS — R0789 Other chest pain: Secondary | ICD-10-CM | POA: Diagnosis not present

## 2023-04-30 DIAGNOSIS — T50904A Poisoning by unspecified drugs, medicaments and biological substances, undetermined, initial encounter: Secondary | ICD-10-CM

## 2023-04-30 DIAGNOSIS — I10 Essential (primary) hypertension: Secondary | ICD-10-CM | POA: Diagnosis not present

## 2023-04-30 DIAGNOSIS — F1594 Other stimulant use, unspecified with stimulant-induced mood disorder: Secondary | ICD-10-CM | POA: Diagnosis present

## 2023-04-30 DIAGNOSIS — R059 Cough, unspecified: Secondary | ICD-10-CM | POA: Diagnosis not present

## 2023-04-30 DIAGNOSIS — T483X1A Poisoning by antitussives, accidental (unintentional), initial encounter: Secondary | ICD-10-CM | POA: Diagnosis present

## 2023-04-30 LAB — RAPID URINE DRUG SCREEN, HOSP PERFORMED
Amphetamines: POSITIVE — AB
Barbiturates: NOT DETECTED
Benzodiazepines: NOT DETECTED
Cocaine: NOT DETECTED
Opiates: NOT DETECTED
Tetrahydrocannabinol: NOT DETECTED

## 2023-04-30 LAB — CBC
HCT: 39.2 % (ref 39.0–52.0)
Hemoglobin: 13.7 g/dL (ref 13.0–17.0)
MCH: 30 pg (ref 26.0–34.0)
MCHC: 34.9 g/dL (ref 30.0–36.0)
MCV: 86 fL (ref 80.0–100.0)
Platelets: 347 10*3/uL (ref 150–400)
RBC: 4.56 MIL/uL (ref 4.22–5.81)
RDW: 11.8 % (ref 11.5–15.5)
WBC: 9.4 10*3/uL (ref 4.0–10.5)
nRBC: 0 % (ref 0.0–0.2)

## 2023-04-30 LAB — COMPREHENSIVE METABOLIC PANEL
ALT: 75 U/L — ABNORMAL HIGH (ref 0–44)
AST: 40 U/L (ref 15–41)
Albumin: 4.2 g/dL (ref 3.5–5.0)
Alkaline Phosphatase: 56 U/L (ref 38–126)
Anion gap: 10 (ref 5–15)
BUN: 7 mg/dL (ref 6–20)
CO2: 23 mmol/L (ref 22–32)
Calcium: 8.9 mg/dL (ref 8.9–10.3)
Chloride: 102 mmol/L (ref 98–111)
Creatinine, Ser: 0.65 mg/dL (ref 0.61–1.24)
GFR, Estimated: >60 mL/min (ref >60)
Glucose, Bld: 169 mg/dL — ABNORMAL HIGH (ref 70–99)
Potassium: 3.3 mmol/L — ABNORMAL LOW (ref 3.5–5.1)
Sodium: 135 mmol/L (ref 135–145)
Total Bilirubin: 0.8 mg/dL (ref 0.3–1.2)
Total Protein: 7.7 g/dL (ref 6.5–8.1)

## 2023-04-30 LAB — HEMOGLOBIN A1C
Hgb A1c MFr Bld: 10 % — ABNORMAL HIGH (ref 4.8–5.6)
Mean Plasma Glucose: 240.3 mg/dL

## 2023-04-30 LAB — CBG MONITORING, ED
Glucose-Capillary: 77 mg/dL (ref 70–99)
Glucose-Capillary: 144 mg/dL — ABNORMAL HIGH (ref 70–99)

## 2023-04-30 LAB — ETHANOL: Alcohol, Ethyl (B): <10 mg/dL (ref <10)

## 2023-04-30 LAB — ACETAMINOPHEN LEVEL: Acetaminophen (Tylenol), Serum: <10 ug/mL — ABNORMAL LOW (ref 10–30)

## 2023-04-30 LAB — SALICYLATE LEVEL: Salicylate Lvl: <7 mg/dL — ABNORMAL LOW (ref 7.0–30.0)

## 2023-04-30 MED ORDER — INSULIN ASPART 100 UNIT/ML IJ SOLN
0.0000 [IU] | Freq: Three times a day (TID) | INTRAMUSCULAR | Status: DC
  Administered 2023-04-30: 3 [IU] via SUBCUTANEOUS
  Filled 2023-04-30: qty 0.2

## 2023-04-30 MED ORDER — LOSARTAN POTASSIUM 25 MG PO TABS
25.0000 mg | ORAL_TABLET | Freq: Every day | ORAL | Status: DC
  Administered 2023-04-30: 25 mg via ORAL
  Filled 2023-04-30: qty 1

## 2023-04-30 MED ORDER — INSULIN GLARGINE-YFGN 100 UNIT/ML ~~LOC~~ SOLN
50.0000 [IU] | Freq: Every day | SUBCUTANEOUS | Status: DC
  Filled 2023-04-30: qty 0.5

## 2023-04-30 NOTE — ED Provider Notes (Signed)
[place] EMERGENCY DEPARTMENT AT [facility] Provider Note   CSN: [id number] Arrival date & time: 04/30/23  0102     History  Chief Complaint  Patient presents with   Suicide Attempt    Jason Wolf is a 42 y.o. male.  Patient presents to the emergency department after overdose.  Patient reports that he took multiple boxes of Coricidin cold and cough from [employer].  He reports that he has been doing this since he was 42 years old, does not to get high.  He apparently told EMS, however, that he was trying to kill himself.  At this point, in the ED, patient denies suicidal ideation.       Home Medications Prior to Admission medications   Medication Sig Start Date End Date Taking? Authorizing Provider  Blood Glucose Monitoring Suppl (TRUE METRIX METER) w/Device KIT Use as instructed. Check blood glucose level by fingerstick 3-5 times per day. 06/29/20   Claiborne Rigg, NP  Calcium Carbonate Antacid (TUMS CHEWY DELIGHTS PO) Take 2 tablets by mouth daily as needed (heartburn).    [provider]  Continuous Blood Gluc Sensor (FREESTYLE LIBRE 3 SENSOR) MISC Change sensor every 14 days. 05/18/22     glucose monitoring kit (FREESTYLE) monitoring kit 1 each by Does not apply route 4 (four) times daily - after meals and at bedtime. 1 month Diabetic Testing Supplies for QAC-QHS accuchecks. 02/23/20   Ghimire, Werner Lean, MD  insulin aspart (NOVOLOG FLEXPEN) 100 UNIT/ML FlexPen Inject 15-20 Units into the skin three times daily and correctional scale as directed. 05/18/22     insulin glargine (SEMGLEE) 100 UNIT/ML injection Inject 50 Units into the skin at bedtime.    [provider]  Insulin Pen Needle (PEN NEEDLES) 32G X 4 MM MISC Use as instructed. Inject into the skin 5 times per day 06/29/20   Claiborne Rigg, NP  losartan (COZAAR) 25 MG tablet Take 25 mg by mouth daily.    [provider]  melatonin 5 MG TABS Take 5-10 mg by mouth at bedtime.     [provider]  tirzepatide Greggory Keen) 2.5 MG/0.5ML Pen Inject 2.5 mg into the skin once a week. 05/18/22         Allergies    Bee venom, Wasp venom, Yellow jacket venom, and Codeine    Review of Systems   Review of Systems  Physical Exam Updated Vital Signs BP (!) 173/104 (BP Location: Right Arm)   Pulse 88   Temp 98.4 F (36.9 C) (Oral)   Resp 16   Wt 105.2 kg   SpO2 95%   BMI 32.36 kg/m  Physical Exam Vitals and nursing note reviewed.  Constitutional:      General: He is not in acute distress.    Appearance: He is well-developed.  HENT:     Head: Normocephalic and atraumatic.     Mouth/Throat:     Mouth: Mucous membranes are moist.  Eyes:     General: Vision grossly intact. Gaze aligned appropriately.     Extraocular Movements: Extraocular movements intact.     Conjunctiva/sclera: Conjunctivae normal.  Cardiovascular:     Rate and Rhythm: Normal rate and regular rhythm.     Pulses: Normal pulses.     Heart sounds: Normal heart sounds, S1 normal and S2 normal. No murmur heard.    No friction rub. No gallop.  Pulmonary:     Effort: Pulmonary effort is normal. No respiratory distress.  Breath sounds: Normal breath sounds.  Abdominal:     Palpations: Abdomen is soft.     Tenderness: There is no abdominal tenderness. There is no guarding or rebound.     Hernia: No hernia is present.  Musculoskeletal:        General: No swelling.     Cervical back: Full passive range of motion without pain, normal range of motion and neck supple. No pain with movement, spinous process tenderness or muscular tenderness. Normal range of motion.     Right lower leg: No edema.     Left lower leg: No edema.  Skin:    General: Skin is warm and dry.     Capillary Refill: Capillary refill takes less than 2 seconds.     Findings: No ecchymosis, erythema, lesion or wound.  Neurological:     Mental Status: He is alert and oriented to person, place, and time.     GCS: GCS eye  subscore is 4. GCS verbal subscore is 5. GCS motor subscore is 6.     Cranial Nerves: Cranial nerves 2-12 are intact.     Sensory: Sensation is intact.     Motor: Motor function is intact. No weakness or abnormal muscle tone.     Coordination: Coordination is intact.  Psychiatric:        Mood and Affect: Mood normal.        Speech: Speech normal.        Behavior: Behavior normal.     ED Results / Procedures / Treatments   Labs (all labs ordered are listed, but only abnormal results are displayed) Labs Reviewed  COMPREHENSIVE METABOLIC PANEL - Abnormal; Notable for the following components:      Result Value   Potassium 3.3 (*)    Glucose, Bld 169 (*)    ALT 75 (*)    All other components within normal limits  SALICYLATE LEVEL - Abnormal; Notable for the following components:   Salicylate Lvl <7.0 (*)    All other components within normal limits  ACETAMINOPHEN LEVEL - Abnormal; Notable for the following components:   Acetaminophen (Tylenol), Serum <10 (*)    All other components within normal limits  RAPID URINE DRUG SCREEN, HOSP PERFORMED - Abnormal; Notable for the following components:   Amphetamines POSITIVE (*)    All other components within normal limits  ETHANOL  CBC    EKG None  Radiology No results found.  Procedures Procedures    Medications Ordered in ED Medications - No data to display  ED Course/ Medical Decision Making/ A&P                                 Medical Decision Making Amount and/or Complexity of Data Reviewed Labs: ordered.   Patient reports intentionally overdosing on Coricidin cough and cold pills.  He reports that the ingestion was all at once around 10:30 PM.  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  He reports that he does have increased stress.  He is currently denying suicidal ideation.  Will require psychiatric evaluation.  Ingestion was more than 6 hours  ago, patient is awake, alert, oriented with normal vital signs.        Final Clinical Impression(s) / ED Diagnoses Final diagnoses:  Overdose of undetermined intent, initial encounter    Rx / DC Orders ED Discharge  Orders     None         Gilda Crease, MD 04/30/23 [phone]

## 2023-04-30 NOTE — Inpatient Diabetes Management (Signed)
Inpatient Diabetes Program Recommendations  AACE/ADA: New Consensus Statement on Inpatient Glycemic Control (2015)  Target Ranges:  Prepandial:   less than 140 mg/dL      Peak postprandial:   less than 180 mg/dL (1-2 hours)      Critically ill patients:  140 - 180 mg/dL   Lab Results  Component Value Date   GLUCAP 77 04/30/2023   HGBA1C 10.0 (H) 04/30/2023    Review of Glycemic Control  Latest Reference Range & Units 04/30/23 08:05  Glucose-Capillary 70 - 99 mg/dL 77    Diabetes history: DM2  Outpatient Diabetes medications:  Semglee 50 units at bedtime Novolog 15-20 units TID  Mounjaro 2.5 mg weekly Freestyle Libre 3  Current orders for Inpatient glycemic control:  Semglee 50 units at bedtime Novolog 0-20 units TID  Inpatient Diabetes Program Recommendations:    Please consider:  Semglee 25 units at bedtime (50% of home dose) if cbgs > 180 mg/dL.  Will continue to follow while inpatient.  Thank you, Dulce Sellar, MSN, CDCES Diabetes Coordinator Inpatient Diabetes Program [phone] (team pager from 8a-5p)

## 2023-04-30 NOTE — ED Provider Notes (Signed)
Patient cleared for discharge by psychiatry   Lorre Nick, MD 04/30/23 1337

## 2023-04-30 NOTE — Discharge Summary (Cosign Needed Addendum)
[facility] Psych ED Discharge  04/30/2023 11:47 AM Jason Wolf  MRN:  [id number]  Principal Problem: Dextromethorphan overdose Discharge Diagnoses: Principal Problem:   Dextromethorphan overdose Active Problems:   Stimulant-induced mood disorder (HCC)  Clinical Impression:  Final diagnoses:  Overdose of undetermined intent, initial encounter   Subjective: per Triage note"  Pt took a lot of cough and cold medication for 2 reasons per EMS, " to get high and kill himself".   he amount the pt took is unknown but they are tablets and both of boxes are empty EMS reports pt vitals are stable but pt is getting more tired, and agitated with them" Patient was seen this morning for evaluation.  Patient has previous diagnoses of BPD, Bipolar disorder, IVDU with Hepatitis, Polysubstance abuse,Depression, Anxiety.  He vehemently denied feeling suicidal or mentioned suicide ideation to EMS when he called them.  Patient states he took the Coricidin cough and cold Medication to get high and not to die.  Patient states he has been addicted to this Medication for 18 year.  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  Patient also states he works full time and his job is very stressful.  Patient added that he is stressed out because no matter how much he works his Production designer, theatre/television/film is not satisfied.  He does not want to drop out of school but plans to may be cut his hours down.  Patient during our interaction denied SI/HI/AVH.  Patient declined offer for IOP which benefits students and adult.  Patient denied using any drug but his urine is positive for Amphetamine. Patient is a caucasian male, 42 years old who was brought in by EMS after international OD on Coricidin cough and cold syrup. And he admits he has been addicted to this cough Medication for 18 years.  Today he denies SI/HI/AVH.  He plans to go back to school and continue care with the student Clinic and Counsellor.  We reviewed safety  plan-call 911 or [phone] or go to student clinic for mental health crisis.  Go to [facility] for mental health care.  DR Lucianne Muss reviewed discharged with Provider and is in agreement with discharge.  Resources for outpatient mental health care, long term substance abuse care given  ED Assessment Time Calculation: Start Time: 1116 Stop Time: 1137 Total Time in Minutes (Assessment Completion): 21   Past Psychiatric History: See above  Past Medical History:  Past Medical History:  Diagnosis Date   Bipolar disorder (HCC)    DM (diabetes mellitus) (HCC)    ETOH abuse    Hypertension    Opiate abuse, episodic (HCC)     Past Surgical History:  Procedure Laterality Date   I & D EXTREMITY Right 02/20/2020   Procedure: IRRIGATION AND DEBRIDEMENT HAND;  Surgeon: Ernest Mallick, MD;  Location: [facility] OR;  Service: Orthopedics;  Laterality: Right;   KNEE ARTHROSCOPY     Family History:  Family History  Problem Relation Age of Onset   Diabetes type II Mother    Diabetes Mother    Family Psychiatric  History: unknown by patient. Previous admission was [facility], [facility].  Rehab Drug treatment at [facility] for 90 days and 30 days treatment. Social History:  Social History   Substance and Sexual Activity  Alcohol Use No   Comment: in rehab     Social History   Substance and Sexual Activity  Drug Use Yes  Types: Methamphetamines   Comment: was in rehab and relapsed after 22 months. 02/19/20    Social History   Socioeconomic History   Marital status: Single    Spouse name: Not on file   Number of children: Not on file   Years of education: Not on file   Highest education level: Not on file  Occupational History   Not on file  Tobacco Use   Smoking status: Every Day    Current packs/day: 1.00    Types: Cigarettes   Smokeless tobacco: Never  Vaping Use   Vaping status: Every Day  Substance and Sexual Activity   Alcohol use: No    Comment: in rehab   Drug  use: Yes    Types: Methamphetamines    Comment: was in rehab and relapsed after 22 months. 02/19/20   Sexual activity: Not Currently  Other Topics Concern   Not on file  Social History Narrative   Not on file   Social Determinants of Health   Financial Resource Strain: Not on file  Food Insecurity: Not on file  Transportation Needs: Not on file  Physical Activity: Not on file  Stress: Not on file  Social Connections: Not on file    Tobacco Cessation:  N/A, patient does not currently use tobacco products  Current Medications: Current Facility-Administered Medications  Medication Dose Route Frequency Provider Last Rate Last Admin   insulin aspart (novoLOG) injection 0-20 Units  0-20 Units Subcutaneous TID WC Pollina, Canary Brim, MD       insulin glargine-yfgn (SEMGLEE) injection 50 Units  50 Units Subcutaneous QHS Pollina, Canary Brim, MD       losartan (COZAAR) tablet 25 mg  25 mg Oral Daily Pollina, Canary Brim, MD   25 mg at 04/30/23 1005   Current Outpatient Medications  Medication Sig Dispense Refill   Calcium Carbonate Antacid (TUMS CHEWY DELIGHTS PO) Take 1 tablet by mouth as needed (heartburn).     hydrOXYzine (VISTARIL) 25 MG capsule Take 25 mg by mouth 2 (two) times daily as needed for anxiety.     SEMGLEE, YFGN, 100 UNIT/ML Pen Inject into the skin.     traZODone (DESYREL) 50 MG tablet Take 50 mg by mouth at bedtime.     Continuous Blood Gluc Sensor (FREESTYLE LIBRE 3 SENSOR) MISC Change sensor every 14 days. 2 each 11   glucose monitoring kit (FREESTYLE) monitoring kit 1 each by Does not apply route 4 (four) times daily - after meals and at bedtime. 1 month Diabetic Testing Supplies for QAC-QHS accuchecks. 1 each 1   insulin aspart (NOVOLOG FLEXPEN) 100 UNIT/ML FlexPen Inject 15-20 Units into the skin three times daily and correctional scale as directed. 24 mL 3   insulin glargine (SEMGLEE) 100 UNIT/ML injection Inject 50 Units into the skin at bedtime.      Insulin Pen Needle (PEN NEEDLES) 32G X 4 MM MISC Use as instructed. Inject into the skin 5 times per day 200 each 6   losartan (COZAAR) 25 MG tablet Take 25 mg by mouth daily.     melatonin 5 MG TABS Take 5-10 mg by mouth at bedtime.     tirzepatide [facility]) 2.5 MG/0.5ML Pen Inject 2.5 mg into the skin once a week. 2 mL 1   PTA Medications: (Not in a hospital admission)   [place] Scale:  Flowsheet Row ED from 04/30/2023 in [facility] Emergency Department at [facility] ED from 04/18/2023 in [facility] Emergency Department at [facility]  ED from 01/22/2023 in [facility] Emergency Department at [facility]  C-SSRS RISK CATEGORY No Risk No Risk No Risk       Musculoskeletal: Strength & Muscle Tone: within normal limits Gait & Station: normal Patient leans: Front  Psychiatric Specialty Exam: Presentation  General Appearance:  Casual; Neat  Eye Contact: Good  Speech: Clear and Coherent; Normal Rate  Speech Volume: Normal  Handedness: Right   Mood and Affect  Mood: Anxious  Affect: Congruent   Thought Process  Thought Processes: Coherent  Descriptions of Associations:Intact  Orientation:Full (Time, Place and Person)  Thought Content:Logical  History of Schizophrenia/Schizoaffective disorder:No data recorded Duration of Psychotic Symptoms:No data recorded Hallucinations:Hallucinations: None  Ideas of Reference:None  Suicidal Thoughts:Suicidal Thoughts: No  Homicidal Thoughts:Homicidal Thoughts: No   Sensorium  Memory: Immediate Good; Recent Fair; Remote Fair  Judgment: Good  Insight: Good   Executive Functions  Concentration: Good  Attention Span: Good  Recall: Good  Fund of Knowledge: Good  Language: Good   Psychomotor Activity  Psychomotor Activity: Psychomotor Activity: Normal   Assets  Assets: Communication Skills; Desire for Improvement; Housing; Health and safety inspector; Social  Support   Sleep  Sleep: Sleep: Fair    Physical Exam: Physical Exam Vitals and nursing note reviewed.  Constitutional:      Appearance: Normal appearance.  HENT:     Nose: Nose normal.  Cardiovascular:     Rate and Rhythm: Normal rate and regular rhythm.  Pulmonary:     Effort: Pulmonary effort is normal.  Musculoskeletal:        General: Normal range of motion.     Cervical back: Normal range of motion.  Skin:    General: Skin is dry.  Neurological:     Mental Status: He is alert and oriented to person, place, and time.  Psychiatric:        Attention and Perception: Attention and perception normal.        Mood and Affect: Mood is anxious.        Speech: Speech normal.        Behavior: Behavior normal. Behavior is cooperative.        Thought Content: Thought content normal.        Judgment: Judgment normal.    Review of Systems  Constitutional: Negative.   HENT: Negative.    Eyes: Negative.   Respiratory: Negative.    Cardiovascular: Negative.   Gastrointestinal: Negative.   Genitourinary: Negative.   Musculoskeletal: Negative.   Skin: Negative.   Neurological: Negative.   Endo/Heme/Allergies: Negative.   Psychiatric/Behavioral:  Positive for substance abuse. The patient is nervous/anxious.    Blood pressure (!) 173/104, pulse 88, temperature 98.4 F (36.9 C), temperature source Oral, resp. rate 16, weight 105.2 kg, SpO2 95%. Body mass index is 32.36 kg/m.   Demographic Factors:  Male, Adolescent or young adult, Caucasian, and Low socioeconomic status  Loss Factors: NA  Historical Factors: Prior suicide attempts and Impulsivity  Risk Reduction Factors:   Employed and Living with another person, especially a relative  Continued Clinical Symptoms:  Bipolar Disorder:   Mixed State More than one psychiatric diagnosis Previous Psychiatric Diagnoses and Treatments  Cognitive Features That Contribute To Risk:  None    Suicide Risk:  Minimal: No  identifiable suicidal ideation.  Patients presenting with no risk factors but with morbid ruminations; may be classified as minimal risk based on the severity of the depressive symptoms    Plan Of Care/Follow-up recommendations:  Activity:  as  tolerated Diet:  Low Carb diet  Medical Decision Making: Patient denies SI/HI/AVH.  He is a Consulting civil engineer and employed.  Patient admits he is addicted to cough syrup and plan to seek substance abuse treatment.  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  Patient is Psychiatrically cleared.   Problem 1: Stimulant induced Mood Disorder  Problem 2: OD on Cough Medication Dextromethorphan-Intentional  Disposition: Psychiatrically Cleared Earney Navy, NP-PMHNP-BC 04/30/2023, 11:47 AM

## 2023-04-30 NOTE — ED Triage Notes (Addendum)
Pt took a lot of cough and cold medication for 2 reasons per EMS, " to get high and kill himself".  The amount the pt took is unknown but they are tablets and both of boxes are empty EMS reports pt vitals are stable but pt is getting more tired, and agitated with them.

## 2023-04-30 NOTE — Progress Notes (Signed)
TOC consulted for mental health resources. Resources attached to AVS. No further TOC needs.

## 2023-05-08 DIAGNOSIS — F32A Depression, unspecified: Secondary | ICD-10-CM | POA: Diagnosis not present

## 2023-05-14 ENCOUNTER — Other Ambulatory Visit: Payer: Self-pay

## 2023-05-14 ENCOUNTER — Emergency Department (HOSPITAL_BASED_OUTPATIENT_CLINIC_OR_DEPARTMENT_OTHER): Payer: BC Managed Care – PPO

## 2023-05-14 ENCOUNTER — Encounter (HOSPITAL_BASED_OUTPATIENT_CLINIC_OR_DEPARTMENT_OTHER): Payer: Self-pay | Admitting: Emergency Medicine

## 2023-05-14 ENCOUNTER — Emergency Department (HOSPITAL_BASED_OUTPATIENT_CLINIC_OR_DEPARTMENT_OTHER)
Admission: EM | Admit: 2023-05-14 | Discharge: 2023-05-14 | Disposition: A | Discharge status: Home / Self Care | Payer: BC Managed Care – PPO | Attending: Emergency Medicine | Admitting: Emergency Medicine

## 2023-05-14 DIAGNOSIS — S20212A Contusion of left front wall of thorax, initial encounter: Secondary | ICD-10-CM | POA: Insufficient documentation

## 2023-05-14 DIAGNOSIS — I10 Essential (primary) hypertension: Secondary | ICD-10-CM | POA: Diagnosis not present

## 2023-05-14 DIAGNOSIS — Z794 Long term (current) use of insulin: Secondary | ICD-10-CM | POA: Insufficient documentation

## 2023-05-14 DIAGNOSIS — W010XXA Fall on same level from slipping, tripping and stumbling without subsequent striking against object, initial encounter: Secondary | ICD-10-CM | POA: Insufficient documentation

## 2023-05-14 DIAGNOSIS — M25512 Pain in left shoulder: Secondary | ICD-10-CM | POA: Diagnosis not present

## 2023-05-14 DIAGNOSIS — S299XXA Unspecified injury of thorax, initial encounter: Secondary | ICD-10-CM | POA: Diagnosis not present

## 2023-05-14 DIAGNOSIS — R0781 Pleurodynia: Secondary | ICD-10-CM | POA: Diagnosis not present

## 2023-05-14 DIAGNOSIS — Z79899 Other long term (current) drug therapy: Secondary | ICD-10-CM | POA: Insufficient documentation

## 2023-05-14 DIAGNOSIS — E119 Type 2 diabetes mellitus without complications: Secondary | ICD-10-CM | POA: Insufficient documentation

## 2023-05-14 DIAGNOSIS — W19XXXA Unspecified fall, initial encounter: Secondary | ICD-10-CM

## 2023-05-14 HISTORY — DX: Unspecified viral hepatitis C without hepatic coma: B19.20

## 2023-05-14 LAB — URINALYSIS, ROUTINE W REFLEX MICROSCOPIC
Bilirubin Urine: NEGATIVE
Glucose, UA: NEGATIVE mg/dL
Ketones, ur: NEGATIVE mg/dL
Nitrite: NEGATIVE
Protein, ur: 100 mg/dL — AB
Specific Gravity, Urine: 1.025 (ref 1.005–1.030)
pH: 5.5 (ref 5.0–8.0)

## 2023-05-14 LAB — URINALYSIS, MICROSCOPIC (REFLEX)

## 2023-05-14 MED ORDER — METHOCARBAMOL 500 MG PO TABS
500.0000 mg | ORAL_TABLET | Freq: Once | ORAL | Status: AC
  Administered 2023-05-14: 500 mg via ORAL
  Filled 2023-05-14: qty 1

## 2023-05-14 MED ORDER — IBUPROFEN 800 MG PO TABS
800.0000 mg | ORAL_TABLET | Freq: Once | ORAL | Status: AC
  Administered 2023-05-14: 800 mg via ORAL
  Filled 2023-05-14: qty 1

## 2023-05-14 MED ORDER — NAPROXEN 500 MG PO TABS: 500.0000 mg | ORAL_TABLET | Freq: Two times a day (BID) | ORAL | 0 refills | Status: DC | PRN

## 2023-05-14 NOTE — ED Notes (Signed)
ED Provider at bedside. 

## 2023-05-14 NOTE — ED Provider Notes (Signed)
[place] EMERGENCY DEPARTMENT AT [facility] Provider Note   CSN: [id number] Arrival date & time: 05/14/23  0503     History  Chief Complaint  Patient presents with   Jason Wolf is a 42 y.o. male.  Patient with a history of hypertension, diabetes, polysubstance abuse presenting with left-sided rib pain.  States he was in a chair last night when he fell because of "being clumsy" onto his left side onto concrete.  This happened around 7 PM.  Struck his left shoulder and left ribs.  Denies hitting head or losing consciousness.  Denies any blood thinner use.  Has pain to his left anterior lateral ribs and some shortness of breath progressively worsening since last night.  Has not take anything for the pain at home.  No midline neck or back pain.  No headache.  No chest pain or abdominal pain.  No nausea or vomiting.  No blood thinner use.  The history is provided by the patient.  Fall Pertinent negatives include no chest pain, no abdominal pain, no headaches and no shortness of breath.       Home Medications Prior to Admission medications   Medication Sig Start Date End Date Taking? Authorizing Provider  Calcium Carbonate Antacid (TUMS CHEWY DELIGHTS PO) Take 1 tablet by mouth as needed (heartburn).    [provider]  Continuous Blood Gluc Sensor (FREESTYLE LIBRE 3 SENSOR) MISC Change sensor every 14 days. 05/18/22     glucose monitoring kit (FREESTYLE) monitoring kit 1 each by Does not apply route 4 (four) times daily - after meals and at bedtime. 1 month Diabetic Testing Supplies for QAC-QHS accuchecks. 02/23/20   Ghimire, Werner Lean, MD  hydrOXYzine (VISTARIL) 25 MG capsule Take 25 mg by mouth 2 (two) times daily as needed for anxiety.    [provider]  insulin aspart (NOVOLOG FLEXPEN) 100 UNIT/ML FlexPen Inject 15-20 Units into the skin three times daily and correctional scale as directed. 05/18/22     insulin glargine (SEMGLEE) 100 UNIT/ML  injection Inject 50 Units into the skin at bedtime.    [provider]  Insulin Pen Needle (PEN NEEDLES) 32G X 4 MM MISC Use as instructed. Inject into the skin 5 times per day 06/29/20   Claiborne Rigg, NP  losartan (COZAAR) 25 MG tablet Take 25 mg by mouth daily.    [provider]  melatonin 5 MG TABS Take 5-10 mg by mouth at bedtime.    [provider]  SEMGLEE, YFGN, 100 UNIT/ML Pen Inject into the skin. 04/04/23   [provider]  tirzepatide Greggory Keen) 2.5 MG/0.5ML Pen Inject 2.5 mg into the skin once a week. 05/18/22     traZODone (DESYREL) 50 MG tablet Take 50 mg by mouth at bedtime. 04/12/23   [provider]      Allergies    Bee venom, Wasp venom, Yellow jacket venom, and Codeine    Review of Systems   Review of Systems  Constitutional:  Negative for activity change, appetite change and fever.  HENT:  Negative for congestion and rhinorrhea.   Respiratory:  Negative for cough, chest tightness and shortness of breath.   Cardiovascular:  Negative for chest pain.  Gastrointestinal:  Negative for abdominal pain, nausea and vomiting.  Genitourinary:  Negative for dysuria and hematuria.  Musculoskeletal:  Positive for arthralgias and myalgias.  Skin:  Negative for rash.  Neurological:  Negative for dizziness, weakness and headaches.   all  other systems are negative except as noted in the HPI and PMH.    Physical Exam Updated Vital Signs BP (!) 172/101 (BP Location: Right Arm)   Pulse 98   Temp 98 F (36.7 C) (Oral)   Resp 20   SpO2 100%  Physical Exam Vitals and nursing note reviewed.  Constitutional:      General: He is not in acute distress.    Appearance: He is well-developed.  HENT:     Head: Normocephalic and atraumatic.     Mouth/Throat:     Pharynx: No oropharyngeal exudate.  Eyes:     Conjunctiva/sclera: Conjunctivae normal.     Pupils: Pupils are equal, round, and reactive to light.  Neck:     Comments: No  meningismus. Cardiovascular:     Rate and Rhythm: Normal rate and regular rhythm.     Heart sounds: Normal heart sounds. No murmur heard. Pulmonary:     Effort: Pulmonary effort is normal. No respiratory distress.     Breath sounds: Normal breath sounds.     Comments: Tender left anterior lateral ribs, no ecchymosis, no crepitus Chest:     Chest wall: Tenderness present.  Abdominal:     Palpations: Abdomen is soft.     Tenderness: There is no abdominal tenderness. There is no guarding or rebound.  Musculoskeletal:        General: No tenderness. Normal range of motion.     Cervical back: Normal range of motion and neck supple.     Comments: Abrasion left lateral shoulder without crepitus or bony tenderness.  Skin:    General: Skin is warm.  Neurological:     Mental Status: He is alert and oriented to person, place, and time.     Cranial Nerves: No cranial nerve deficit.     Motor: No abnormal muscle tone.     Coordination: Coordination normal.     Comments:  5/5 strength throughout. CN 2-12 intact.Equal grip strength.   Psychiatric:        Behavior: Behavior normal.     ED Results / Procedures / Treatments   Labs (all labs ordered are listed, but only abnormal results are displayed) Labs Reviewed - No data to display  EKG None  Radiology DG Shoulder Left  Result Date: 05/14/2023 CLINICAL DATA:  Patient fell out of his chair.  Left shoulder pain. EXAM: LEFT SHOULDER - 2+ VIEW COMPARISON:  None Available. FINDINGS: There is no evidence of fracture or dislocation. There is no evidence of arthropathy or other focal bone abnormality. Soft tissues are unremarkable. IMPRESSION: Negative. Electronically Signed   By: Kennith Center M.D.   On: 05/14/2023 06:46   DG Ribs Unilateral W/Chest Left  Result Date: 05/14/2023 CLINICAL DATA:  Patient fell out of his chair. Left rib and shoulder pain. EXAM: LEFT RIBS AND CHEST - 3+ VIEW COMPARISON:  None Available. FINDINGS: The lungs are  clear without focal pneumonia, edema, pneumothorax or pleural effusion. The cardiopericardial silhouette is within normal limits for size. Oblique views of the left ribs were obtained. Radio-opaque marker has been placed on the skin at the site of patient concern. No evidence for an acute displaced left-sided rib fracture. IMPRESSION: 1. No acute cardiopulmonary findings. 2. No evidence for an acute displaced left-sided rib fracture. Electronically Signed   By: Kennith Center M.D.   On: 05/14/2023 06:45    Procedures Ultrasound ED FAST  Date/Time: 05/14/2023 5:27 AM  Performed by: Glynn Octave, MD Authorized by: Glynn Octave, MD  Procedure details:    Indications: blunt abdominal trauma and blunt chest trauma       Assess for:  Hemothorax, intra-abdominal fluid, pneumothorax and pericardial effusion    Technique:  Abdominal and cardiac    Images: archived    Study Limitations: body habitus  Abdominal findings:    L kidney:  Visualized   R kidney:  Visualized   Liver:  Visualized    Bladder:  Visualized   Hepatorenal space visualized: identified     Splenorenal space: identified     Rectovesical free fluid: not identified     Splenorenal free fluid: not identified     Hepatorenal space free fluid: not identified   Cardiac findings:    Heart:  Visualized   Wall motion: identified     Pericardial effusion: not identified       Medications Ordered in ED Medications  ibuprofen (ADVIL) tablet 800 mg (has no administration in time range)  methocarbamol (ROBAXIN) tablet 500 mg (has no administration in time range)    ED Course/ Medical Decision Making/ A&P                                 Medical Decision Making Amount and/or Complexity of Data Reviewed Labs: ordered. Decision-making details documented in ED Course. Radiology: ordered and independent interpretation performed. Decision-making details documented in ED Course. ECG/medicine tests: ordered and independent  interpretation performed. Decision-making details documented in ED Course.  Risk Prescription drug management.   Fall onto left shoulder and ribs.  No head injury.  No loss of consciousness.  No vomiting.  No blood thinner use. No hypoxia or increased work of breathing.  Bedside ultrasound shows no free fluid in abdomen.  Left rib x-ray is negative for fracture or pneumothorax.  Left shoulder x-ray is negative.  Results reviewed and interpreted by me.  Patient tolerating p.o. and ambulatory.  Does have hemoglobin in his urine but negative RBCs.  Low suspicion for renal contusion.  Will treat supportively with pain control and PCP follow-up.  Return precautions discussed.        Final Clinical Impression(s) / ED Diagnoses Final diagnoses:  Fall, initial encounter  Contusion of rib on left side, initial encounter    Rx / DC Orders ED Discharge Orders     None         Tonique Mendonca, Jeannett Senior, MD 05/14/23 [phone]

## 2023-05-14 NOTE — Discharge Instructions (Addendum)
Your testing is negative for any kind of broken bones.  Use Tylenol or ibuprofen as needed for aches and for pains.  Follow-up with your doctor.  Return to the ED with new or worsening symptoms.

## 2023-05-14 NOTE — ED Notes (Signed)
Patient transported to X-ray 

## 2023-05-14 NOTE — ED Triage Notes (Signed)
States fell out of his chair last night and is having left rib pain. Gait steady in ED. Denies LOC

## 2023-05-29 DIAGNOSIS — F32A Depression, unspecified: Secondary | ICD-10-CM | POA: Diagnosis not present

## 2023-06-25 DIAGNOSIS — Z23 Encounter for immunization: Secondary | ICD-10-CM | POA: Diagnosis not present

## 2023-07-04 DIAGNOSIS — J029 Acute pharyngitis, unspecified: Secondary | ICD-10-CM | POA: Diagnosis not present

## 2023-07-11 DIAGNOSIS — F32A Depression, unspecified: Secondary | ICD-10-CM | POA: Diagnosis not present

## 2023-08-25 ENCOUNTER — Other Ambulatory Visit: Payer: Self-pay

## 2023-08-25 ENCOUNTER — Emergency Department (HOSPITAL_BASED_OUTPATIENT_CLINIC_OR_DEPARTMENT_OTHER): Payer: BC Managed Care – PPO

## 2023-08-25 ENCOUNTER — Emergency Department (HOSPITAL_BASED_OUTPATIENT_CLINIC_OR_DEPARTMENT_OTHER)
Admission: EM | Admit: 2023-08-25 | Discharge: 2023-08-25 | Disposition: A | Discharge status: Home / Self Care | Payer: BC Managed Care – PPO | Attending: Emergency Medicine | Admitting: Emergency Medicine

## 2023-08-25 ENCOUNTER — Encounter (HOSPITAL_BASED_OUTPATIENT_CLINIC_OR_DEPARTMENT_OTHER): Payer: Self-pay

## 2023-08-25 DIAGNOSIS — R079 Chest pain, unspecified: Secondary | ICD-10-CM | POA: Diagnosis not present

## 2023-08-25 DIAGNOSIS — R1011 Right upper quadrant pain: Secondary | ICD-10-CM | POA: Diagnosis not present

## 2023-08-25 DIAGNOSIS — K7689 Other specified diseases of liver: Secondary | ICD-10-CM | POA: Diagnosis not present

## 2023-08-25 DIAGNOSIS — R0602 Shortness of breath: Secondary | ICD-10-CM | POA: Diagnosis not present

## 2023-08-25 LAB — LIPASE, BLOOD: Lipase: 55 U/L — ABNORMAL HIGH (ref 11–51)

## 2023-08-25 LAB — COMPREHENSIVE METABOLIC PANEL
ALT: 80 U/L — ABNORMAL HIGH (ref 0–44)
AST: 38 U/L (ref 15–41)
Albumin: 4.4 g/dL (ref 3.5–5.0)
Alkaline Phosphatase: 73 U/L (ref 38–126)
Anion gap: 13 (ref 5–15)
BUN: 18 mg/dL (ref 6–20)
CO2: 22 mmol/L (ref 22–32)
Calcium: 9.6 mg/dL (ref 8.9–10.3)
Chloride: 95 mmol/L — ABNORMAL LOW (ref 98–111)
Creatinine, Ser: 0.78 mg/dL (ref 0.61–1.24)
GFR, Estimated: >60 mL/min (ref >60)
Glucose, Bld: 384 mg/dL — ABNORMAL HIGH (ref 70–99)
Potassium: 4.1 mmol/L (ref 3.5–5.1)
Sodium: 130 mmol/L — ABNORMAL LOW (ref 135–145)
Total Bilirubin: 1.1 mg/dL (ref 0.0–1.2)
Total Protein: 8.3 g/dL — ABNORMAL HIGH (ref 6.5–8.1)

## 2023-08-25 LAB — CBC WITH DIFFERENTIAL/PLATELET
Abs Immature Granulocytes: 0.08 10*3/uL — ABNORMAL HIGH (ref 0.00–0.07)
Basophils Absolute: 0.1 10*3/uL (ref 0.0–0.1)
Basophils Relative: 1 %
Eosinophils Absolute: 0.5 10*3/uL (ref 0.0–0.5)
Eosinophils Relative: 4 %
HCT: 43.9 % (ref 39.0–52.0)
Hemoglobin: 16 g/dL (ref 13.0–17.0)
Immature Granulocytes: 1 %
Lymphocytes Relative: 36 %
Lymphs Abs: 4.3 10*3/uL — ABNORMAL HIGH (ref 0.7–4.0)
MCH: 30 pg (ref 26.0–34.0)
MCHC: 36.4 g/dL — ABNORMAL HIGH (ref 30.0–36.0)
MCV: 82.2 fL (ref 80.0–100.0)
Monocytes Absolute: 0.8 10*3/uL (ref 0.1–1.0)
Monocytes Relative: 7 %
Neutro Abs: 6.2 10*3/uL (ref 1.7–7.7)
Neutrophils Relative %: 51 %
Platelets: 467 10*3/uL — ABNORMAL HIGH (ref 150–400)
RBC: 5.34 MIL/uL (ref 4.22–5.81)
RDW: 11.9 % (ref 11.5–15.5)
WBC: 11.9 10*3/uL — ABNORMAL HIGH (ref 4.0–10.5)
nRBC: 0 % (ref 0.0–0.2)

## 2023-08-25 MED ORDER — IBUPROFEN 400 MG PO TABS
600.0000 mg | ORAL_TABLET | Freq: Once | ORAL | Status: AC
  Administered 2023-08-25: 600 mg via ORAL
  Filled 2023-08-25: qty 1

## 2023-08-25 MED ORDER — ALBUTEROL SULFATE HFA 108 (90 BASE) MCG/ACT IN AERS: 2.0000 | INHALATION_SPRAY | RESPIRATORY_TRACT | Status: DC | PRN

## 2023-08-25 NOTE — ED Provider Notes (Signed)
Patient with history of hepatitis C presenting with concern for right upper quadrant pain that started this morning. Awaiting labs and right upper quadrant ultrasound results. Physical Exam  BP 122/89   Pulse 89   Temp 98.1 F (36.7 C) (Oral)   Resp 19   Ht 5\' 11"  (1.803 m)   Wt 102.1 kg   SpO2 100%   BMI 31.38 kg/m   Physical Exam Vitals and nursing note reviewed.  Constitutional:      General: He is not in acute distress.    Appearance: He is well-developed.     Comments: Well appearing, moving around comfortably.  No vomiting  HENT:     Head: Normocephalic and atraumatic.  Eyes:     Conjunctiva/sclera: Conjunctivae normal.  Cardiovascular:     Rate and Rhythm: Normal rate and regular rhythm.     Heart sounds: No murmur heard. Pulmonary:     Effort: Pulmonary effort is normal. No respiratory distress.     Breath sounds: Normal breath sounds.  Abdominal:     Palpations: Abdomen is soft.     Tenderness: There is no abdominal tenderness.     Comments: Mild tenderness to palpation of the epigastric area and right upper quadrant, no rebound or guarding  Musculoskeletal:        General: No swelling.     Cervical back: Neck supple.  Skin:    General: Skin is warm and dry.     Capillary Refill: Capillary refill takes less than 2 seconds.  Neurological:     Mental Status: He is alert.  Psychiatric:        Mood and Affect: Mood normal.     Procedures  Procedures  ED Course / MDM    Medical Decision Making Amount and/or Complexity of Data Reviewed Labs: ordered. Radiology: ordered.  Risk Prescription drug management.     Differential diagnosis includes but is not limited to Cholelithiasis, cholangitis, choledocholithiasis, peptic ulcer, gastritis, gastroenteritis, appendicitis, IBS, IBD, DKA, nephrolithiasis, UTI, pyelonephritis, pancreatitis, diverticulitis, mesenteric ischemia, abdominal aortic aneurysm, small bowel obstruction, volvulus, testicular torsion in  males, ovarian torsion and pregnancy related concerns in females of childbearing age    ED Course:  Patient well-appearing, stable vitals.  Presents with some right upper quadrant pain today.  He does have mild right upper quadrant abdominal tenderness to palpation, but no rebound or guarding.  Abdominal ultrasound was obtained by previous provider which showed normal gallbladder and biliary tree and some hepatic steatosis.  This seems consistent with patient's history of hep C.  Pancreas was unable to be visualized.  Lipase only slightly elevated at 55, given no nausea or vomiting, radiation of pain to the back, and lipase not 2-3 times upper limit of normal, low concern for pancreatitis at this time.  Patient does have a low sodium at 130 here, states he has not eaten much today due to decreased appetite.  Suspect this could be the cause of his hyponatremia.  He did not have any nausea or vomiting that would have contributed to this electrolyte abnormality.  He does have a leukocytosis of 11.9, but low concern for any other acute abdominal pathology that would warrant further imaging at this time.  I discussed these results with the patient.  Ibuprofen was given for pain.  He feels like he can manage symptoms at home.  Feel patient is stable and appropriate for discharge home at this time.  Impression: Epigastric/right upper quadrant abdominal pain  Disposition:  The patient was  discharged home with instructions to use Tylenol and ibuprofen as needed for pain.  Bland diet and then may advance diet as tolerated. Return precautions given.  Imaging Studies ordered: I ordered imaging studies including right upper quadrant ultrasound, chest x-ray I independently visualized the imaging with scope of interpretation limited to determining acute life threatening conditions related to emergency care. Imaging showed no acute abnormalities I agree with the radiologist  interpretation               Alver Fisher 08/25/23 Maeola Sarah, MD 08/26/23 [phone]

## 2023-08-25 NOTE — Discharge Instructions (Addendum)
The abdominal ultrasound did not show any abnormalities with your gallbladder.  It did show hepatic steatosis which is expected with your hepatitis C.  We were unable to visualize the pancreas on your ultrasound.  Your chest x-ray is normal today.  Your liver enzymes are at your baseline today.  Your kidney function is normal.  Your lipase which is the pancreas enzyme is slightly elevated at 55 (normal between 11 and 51), however, it is not elevated enough to where we would be concerned for pancreatitis at this time.  Your electrolytes are normal besides a slightly low sodium.  Please try to eat some salty snacks at home to bring this number back up.  Please eat a bland diet, no fried foods or spicy foods.  You may advance your diet as tolerated.  You may use up to 600mg  ibuprofen every 6 hours as needed for pain.  Do not exceed 2.4g of ibuprofen per day.  You were given your first dose here today.  Your next dose can be no sooner than 2 AM.  Please follow-up with your PCP if your symptoms do not start to improve within the next 2-3 days.  If you have uncontrolled pain, uncontrolled nausea or vomiting, any other new or concerning symptoms, please return to the ER

## 2023-08-25 NOTE — ED Notes (Signed)
Delay in labs due to patient being a hard stick and failed attempts.  Provider aware  Waiting for MD to place an [place] IV.

## 2023-08-25 NOTE — ED Provider Notes (Signed)
[facility] Provider Note   CSN: [id number] Arrival date & time: 08/25/23  1454     History  Chief Complaint  Patient presents with   Muscle Pain    R ribcage     Jason Wolf is a 43 y.o. male.  43 yo M with PMHx of Hep C, HTN, T2DM who presents to ER complaining of RUQ pain that started this morning. Pt reports he woke up with a sharp stabbing pain in his RUQ that at times radiates to his back. The pain has not dulled down or gone away. Pt has had no N/V/D but has had a decreased appetite and has not ate anything today. Pt was able to work today through the pain but had to take extended breaks. He has never had pain like this before. He denies any fevers, chest pain, or SOB. Pt does not regularly use NSAIDs and reports no alcohol use or abdominal surgeries. Pt reports his doctor did not offer him treatment for hep C.   Muscle Pain Associated symptoms include abdominal pain.       Home Medications Prior to Admission medications   Medication Sig Start Date End Date Taking? Authorizing Provider  Calcium Carbonate Antacid (TUMS CHEWY DELIGHTS PO) Take 1 tablet by mouth as needed (heartburn).    [provider]  Continuous Blood Gluc Sensor (FREESTYLE LIBRE 3 SENSOR) MISC Change sensor every 14 days. 05/18/22     glucose monitoring kit (FREESTYLE) monitoring kit 1 each by Does not apply route 4 (four) times daily - after meals and at bedtime. 1 month Diabetic Testing Supplies for QAC-QHS accuchecks. 02/23/20   Ghimire, Werner Lean, MD  hydrOXYzine (VISTARIL) 25 MG capsule Take 25 mg by mouth 2 (two) times daily as needed for anxiety.    [provider]  insulin aspart (NOVOLOG FLEXPEN) 100 UNIT/ML FlexPen Inject 15-20 Units into the skin three times daily and correctional scale as directed. 05/18/22     insulin glargine (SEMGLEE) 100 UNIT/ML injection Inject 50 Units into the skin at bedtime.    [provider]   Insulin Pen Needle (PEN NEEDLES) 32G X 4 MM MISC Use as instructed. Inject into the skin 5 times per day 06/29/20   Claiborne Rigg, NP  losartan (COZAAR) 25 MG tablet Take 25 mg by mouth daily.    [provider]  melatonin 5 MG TABS Take 5-10 mg by mouth at bedtime.    [provider]  naproxen (NAPROSYN) 500 MG tablet Take 1 tablet (500 mg total) by mouth 2 (two) times daily as needed for moderate pain (pain score 4-6). 05/14/23   Rancour, Jeannett Senior, MD  SEMGLEE, YFGN, 100 UNIT/ML Pen Inject into the skin. 04/04/23   [provider]  tirzepatide Greggory Keen) 2.5 MG/0.5ML Pen Inject 2.5 mg into the skin once a week. 05/18/22     traZODone (DESYREL) 50 MG tablet Take 50 mg by mouth at bedtime. 04/12/23   [provider]      Allergies    Bee venom, Wasp venom, Yellow jacket venom, and Codeine    Review of Systems   Review of Systems  Gastrointestinal:  Positive for abdominal pain.  All other systems reviewed and are negative.   Physical Exam Updated Vital Signs BP 138/89 (BP Location: Left Arm)   Pulse (!) 103   Temp 98.1 F (36.7 C) (Oral)   Resp 17   Ht 5\' 11"  (1.803 m)   Wt 102.1  kg   SpO2 100%   BMI 31.38 kg/m  Physical Exam Vitals and nursing note reviewed.  Constitutional:      General: He is not in acute distress.    Appearance: He is well-developed.  HENT:     Head: Normocephalic and atraumatic.  Eyes:     Conjunctiva/sclera: Conjunctivae normal.  Cardiovascular:     Rate and Rhythm: Normal rate and regular rhythm.     Heart sounds: No murmur heard. Pulmonary:     Effort: Pulmonary effort is normal. No respiratory distress.     Breath sounds: Normal breath sounds.  Abdominal:     Palpations: Abdomen is soft.     Tenderness: There is abdominal tenderness.  Musculoskeletal:        General: No swelling.  Skin:    General: Skin is warm and dry.     Capillary Refill: Capillary refill takes less than 2 seconds.  Neurological:      Mental Status: He is alert.  Psychiatric:        Mood and Affect: Mood normal.     ED Results / Procedures / Treatments   Labs (all labs ordered are listed, but only abnormal results are displayed) Labs Reviewed  CBC WITH DIFFERENTIAL/PLATELET  COMPREHENSIVE METABOLIC PANEL  LIPASE, BLOOD    EKG None  Radiology DG Chest 2 View Result Date: 08/25/2023 CLINICAL DATA:  Shortness of breath. Pain under right pectoralis area. An injury. EXAM: CHEST - 2 VIEW COMPARISON:  Chest and left rib radiographs 05/14/2023 FINDINGS: Cardiac silhouette and mediastinal contours are within normal limits. The lungs are clear. No pleural effusion or pneumothorax. Mild multilevel degenerative disc changes of the thoracic spine. IMPRESSION: No active cardiopulmonary disease. Electronically Signed   By: Neita Garnet M.D.   On: 08/25/2023 16:00    Procedures Procedures    Medications Ordered in ED Medications  albuterol (VENTOLIN HFA) 108 (90 Base) MCG/ACT inhaler 2 puff (has no administration in time range)    ED Course/ Medical Decision Making/ A&P                                 Medical Decision Making Patient complains of pain in his right side.  Patient has a past medical history of hepatitis C.  Amount and/or Complexity of Data Reviewed Labs: ordered. Decision-making details documented in ED Course.    Details: Labs ordered reviewed and interpreted Radiology: ordered.    Details: Ultrasound abdomen ordered reviewed.   Pt's care turned over to [facility] results pending        Final Clinical Impression(s) / ED Diagnoses Final diagnoses:  Right upper quadrant abdominal pain    Rx / DC Orders ED Discharge Orders     None         Osie Cheeks 08/25/23 1854    Pricilla Loveless, MD 08/26/23 [phone]

## 2023-08-25 NOTE — ED Triage Notes (Addendum)
Reports R sided pain under ribcage since this morning. Pain worse when moving. Reports SHOB when moving. Denies known injury

## 2023-08-25 NOTE — ED Provider Notes (Signed)
Angiocath insertion Performed by: Audree Camel  Consent: Verbal consent obtained. Risks and benefits: risks, benefits and alternatives were discussed Time out: Immediately prior to procedure a "time out" was called to verify the correct patient, procedure, equipment, support staff and site/side marked as required.  Preparation: Patient was prepped and draped in the usual sterile fashion.  Vein Location: right basilic  Ultrasound Guided  Gauge: 20  Normal blood return and flush without difficulty Patient tolerance: Patient tolerated the procedure well with no immediate complications.     Pricilla Loveless, MD 08/25/23 [phone]

## 2023-10-18 DIAGNOSIS — F32A Depression, unspecified: Secondary | ICD-10-CM | POA: Diagnosis not present

## 2023-10-23 DIAGNOSIS — E1165 Type 2 diabetes mellitus with hyperglycemia: Secondary | ICD-10-CM | POA: Diagnosis not present

## 2023-10-25 DIAGNOSIS — F32A Depression, unspecified: Secondary | ICD-10-CM | POA: Diagnosis not present

## 2023-10-31 DIAGNOSIS — F32A Depression, unspecified: Secondary | ICD-10-CM | POA: Diagnosis not present

## 2023-11-14 DIAGNOSIS — F32A Depression, unspecified: Secondary | ICD-10-CM | POA: Diagnosis not present

## 2023-11-25 ENCOUNTER — Telehealth: Payer: Self-pay

## 2023-11-25 ENCOUNTER — Other Ambulatory Visit (HOSPITAL_COMMUNITY): Payer: Self-pay

## 2023-11-25 NOTE — Telephone Encounter (Signed)
 [facility] Patient Advocate Encounter  Insurance verification completed.    The patient is insured through [facility]. Patient has [employer], may use a copay card, and/or apply for patient assistance if available.    Ran test claim for MAVYRET  Medication will need a PA.  Ran test claim for EPCL[place]  Medication will need a PA.   We will continue to follow to see if copay assistance is needed.  This test claim was processed through [facility]- copay amounts may vary at other pharmacies due to pharmacy/plan contracts, or as the patient moves through the different stages of their insurance plan.

## 2023-12-02 ENCOUNTER — Other Ambulatory Visit: Payer: Self-pay

## 2023-12-02 ENCOUNTER — Ambulatory Visit (INDEPENDENT_AMBULATORY_CARE_PROVIDER_SITE_OTHER): Admitting: Infectious Diseases

## 2023-12-02 VITALS — BP 154/92 | HR 100 | Temp 96.0°F | Ht 71.0 in | Wt 232.0 lb

## 2023-12-02 DIAGNOSIS — B182 Chronic viral hepatitis C: Secondary | ICD-10-CM | POA: Diagnosis not present

## 2023-12-02 DIAGNOSIS — Z87898 Personal history of other specified conditions: Secondary | ICD-10-CM

## 2023-12-02 DIAGNOSIS — Z114 Encounter for screening for human immunodeficiency virus [HIV]: Secondary | ICD-10-CM | POA: Diagnosis not present

## 2023-12-02 DIAGNOSIS — F17210 Nicotine dependence, cigarettes, uncomplicated: Secondary | ICD-10-CM | POA: Insufficient documentation

## 2023-12-02 DIAGNOSIS — Z1159 Encounter for screening for other viral diseases: Secondary | ICD-10-CM | POA: Diagnosis not present

## 2023-12-02 DIAGNOSIS — Z79899 Other long term (current) drug therapy: Secondary | ICD-10-CM | POA: Diagnosis not present

## 2023-12-02 NOTE — Progress Notes (Addendum)
 [facility]                                      [address], [place], [place], [address]                                               Phn. [phone]; Fax: [phone]                                                               Date: 12/02/23 Reason for Visit: Hepatitis C    HPI: Jason Wolf is a 43 y.o.old male with DM, HTN, IVDU/alcohol abuse, bipolar d/o, Rt UE abscess who is referred by PCP for HCV.   Reports being diagnosed with hepatitis C in 2023 during routine blood screening. He is uncertain if diagnosis was based on antibody or RNA test. No treatment received for hepatitis C. No known liver disease in family. Interested to start treatment.   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 Denies recent alcohol use. Smokes cigarettes, approximately one pack every three days. Not currently sexually active and denies known h/o HCV in sexual partners.  Denies any hospitalizations related to liver disease, jaundice, ascites, GI bleeding, mental status changes, abdominal pain and acholic stool.   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  No acute complaints today   ROS: Denies yellowish discoloration of sclera and skin, abdominal pain/distension, hematemesis, nausea, vomiting, diarrhea, constipation           Dcough, fever, chills, nightsweats, appetite/weight loss, recent hospitalizations, rashes, joint complaints, shortness of breath, chest pain, cough, headaches or GU complaints.   Current Outpatient Medications on File Prior to Visit  Medication Sig Dispense Refill   Calcium  Carbonate Antacid (TUMS CHEWY DELIGHTS PO) Take 1 tablet by mouth as needed (heartburn).     Continuous Blood Gluc Sensor (FREESTYLE LIBRE 3 SENSOR) MISC Change sensor every 14 days. 2 each 11   glucose monitoring kit (FREESTYLE) monitoring kit 1  each by Does not apply route 4 (four) times daily - after meals and at bedtime. 1 month Diabetic Testing Supplies for QAC-QHS accuchecks. 1 each 1   hydrOXYzine  (VISTARIL ) 25 MG capsule Take 25 mg by mouth 2 (two) times daily as needed for anxiety.     insulin  aspart (NOVOLOG  FLEXPEN) 100 UNIT/ML FlexPen Inject 15-20 Units into the skin three times daily and correctional scale as directed. 24 mL 3   insulin  glargine (SEMGLEE ) 100 UNIT/ML injection Inject 50 Units into the skin at bedtime.     Insulin  Pen Needle (PEN NEEDLES) 32G X 4 MM MISC Use as instructed. Inject into the skin 5 times per day 200 each 6   losartan  (COZAAR ) 25 MG tablet Take 25 mg by mouth daily.     melatonin 5 MG TABS Take 5-10 mg by mouth at bedtime.     naproxen  (NAPROSYN ) 500 MG tablet Take 1 tablet (500 mg total) by mouth 2 (two) times daily as needed for moderate  pain (pain score 4-6). 30 tablet 0   SEMGLEE , YFGN, 100 UNIT/ML Pen Inject into the skin.     tirzepatide  (MOUNJARO ) 2.5 MG/0.5ML Pen Inject 2.5 mg into the skin once a week. 2 mL 1   traZODone (DESYREL) 50 MG tablet Take 50 mg by mouth at bedtime.     No current facility-administered medications on file prior to visit.   Allergies  Allergen Reactions   Bee Venom Anaphylaxis   Wasp Venom Anaphylaxis   Yellow Jacket Venom Anaphylaxis   Codeine Nausea And Vomiting and Other (See Comments)    headaches   Past Medical History:  Diagnosis Date   Bipolar disorder (HCC)    DM (diabetes mellitus) (HCC)    ETOH abuse    Hepatitis C    Hypertension    Opiate abuse, episodic (HCC)    Past Surgical History:  Procedure Laterality Date   I & D EXTREMITY Right 02/20/2020   Procedure: IRRIGATION AND DEBRIDEMENT HAND;  Surgeon: Oralia Bills, MD;  Location: [facility] OR;  Service: Orthopedics;  Laterality: Right;   KNEE ARTHROSCOPY     Social History   Socioeconomic History   Marital status: Single    Spouse name: Not on file   Number of children: Not on  file   Years of education: Not on file   Highest education level: Not on file  Occupational History   Not on file  Tobacco Use   Smoking status: Every Day    Current packs/day: 1.00    Types: Cigarettes   Smokeless tobacco: Never  Vaping Use   Vaping status: Every Day  Substance and Sexual Activity   Alcohol use: No    Comment: in rehab   Drug use: Not Currently    Types: Methamphetamines    Comment: was in rehab and relapsed after 22 months. 02/19/20   Sexual activity: Not Currently  Other Topics Concern   Not on file  Social History Narrative   Not on file   Social Drivers of Health   Financial Resource Strain: Not on file  Food Insecurity: Not on file  Transportation Needs: Not on file  Physical Activity: Not on file  Stress: Not on file  Social Connections: Not on file  Intimate Partner Violence: Not on file   Family History  Problem Relation Age of Onset   Diabetes type II Mother    Diabetes Mother    Vitals BP (!) 154/92   Pulse 100   Temp (!) 96 F (35.6 C) (Oral)   Ht 5\' 11"  (1.803 m)   Wt 232 lb (105.2 kg)   BMI 32.36 kg/m   Gen:  no acute distress HEENT: [place]/AT, no scleral icterus, no pale conjunctivae, hearing normal, oral mucosa moist Neck: Supple Cardio: Regular rate and rhythm, S1S2 Resp: Pulmonary effort normal on room air, normal breath sounds  GI: Soft, nontender, nondistended GU: MSK - no pedal edema Skin: No rashes Neuro: Grossly non focal, awake, alert and oriented * 3 Psych: Calm, cooperative  Laboratory     Latest Ref Rng & Units 08/25/2023    6:48 PM 04/30/2023    3:56 AM 04/18/2023   10:34 AM  CBC  WBC 4.0 - 10.5 K/uL 11.9  9.4  8.2   Hemoglobin 13.0 - 17.0 g/dL 16.1  09.6  04.5   Hematocrit 39.0 - 52.0 % 43.9  39.2  41.9   Platelets 150 - 400 K/uL 467  347  414  Latest Ref Rng & Units 08/25/2023    6:48 PM 04/30/2023    3:56 AM 04/18/2023   11:51 AM  CMP  Glucose 70 - 99 mg/dL 413  244  79   BUN 6 - 20 mg/dL 18  7   <5   Creatinine 0.61 - 1.24 mg/dL 0.10  2.72  5.36   Sodium 135 - 145 mmol/L 130  135  140   Potassium 3.5 - 5.1 mmol/L 4.1  3.3  3.2   Chloride 98 - 111 mmol/L 95  102  106   CO2 22 - 32 mmol/L 22  23  21    Calcium  8.9 - 10.3 mg/dL 9.6  8.9  9.3   Total Protein 6.5 - 8.1 g/dL 8.3  7.7    Total Bilirubin 0.0 - 1.2 mg/dL 1.1  0.8    Alkaline Phos 38 - 126 U/L 73  56    AST 15 - 41 U/L 38  40    ALT 0 - 44 U/L 80  75     Imaging US  abdomen 08/25/23 IMPRESSION: 1. Increased hepatic echogenicity consistent with steatosis. 2. Normal sonographic appearance of the gallbladder and biliary tree. 3. Midline structures including the pancreas are obscured by overlying bowel gas.    Assessment/Plan: # Chronic Hepatitis C - Outside labs 05/10/22  Hepatitis B surface ab <5, HIV ag/ab NR, RPR NR, Hep A IgM NR, Hep B surface ag NR, Hep B core ab NR, HCV ab Reactive  - HCV RNA today to see if active infection first. If + RNA, will do further labs for HCV tx including US  abdomen w elastography  - HIV and HBV for screening  - Fu in 10 days    # Smoking/Alcohol use/IVDU - Reports being clean from IVD for approx 3 years  - counseled on cutting down on smoking/cessation as well as alcohol use  I have personally spent 69 minutes involved in face-to-face and non-face-to-face activities for this patient on the day of the visit. Professional time spent includes the following activities: Preparing to see the patient (review of tests), Obtaining and/or reviewing separately obtained history (admission/discharge record), Performing a medically appropriate examination and/or evaluation , Ordering medications/tests/procedures, referring and communicating with other health care professionals, Documenting clinical information in the EMR, Independently interpreting results (not separately reported), Communicating results to the patient/family/caregiver, Counseling and educating the patient/family/caregiver and Care  coordination (not separately reported).   Patients questions were addressed and answered.   Of note, portions of this note may have been created with voice recognition software. While this note has been edited for accuracy, occasional wrong-word or 'sound-a-like' substitutions may have occurred due to the inherent limitations of voice recognition software.   Electronically signed by:  Terre Ferri, MD Infectious Diseases  Office phone [phone] Fax no. [phone]

## 2023-12-04 DIAGNOSIS — F32A Depression, unspecified: Secondary | ICD-10-CM | POA: Diagnosis not present

## 2023-12-04 LAB — HEPATITIS B CORE ANTIBODY, TOTAL: Hep B Core Total Ab: NONREACTIVE

## 2023-12-04 LAB — HEPATITIS C RNA QUANTITATIVE
HCV Quantitative Log: 6.02 {Log_IU}/mL — ABNORMAL HIGH
HCV RNA, PCR, QN: 1040000 [IU]/mL — ABNORMAL HIGH

## 2023-12-04 LAB — HEPATITIS B SURFACE ANTIBODY,QUALITATIVE: Hep B S Ab: REACTIVE — AB

## 2023-12-04 LAB — HIV ANTIBODY (ROUTINE TESTING W REFLEX): HIV 1&2 Ab, 4th Generation: NONREACTIVE

## 2023-12-04 LAB — HEPATITIS B SURFACE ANTIGEN: Hepatitis B Surface Ag: NONREACTIVE

## 2023-12-05 ENCOUNTER — Other Ambulatory Visit: Payer: Self-pay | Admitting: Infectious Diseases

## 2023-12-05 DIAGNOSIS — B182 Chronic viral hepatitis C: Secondary | ICD-10-CM

## 2023-12-09 ENCOUNTER — Ambulatory Visit (HOSPITAL_COMMUNITY)

## 2023-12-09 ENCOUNTER — Telehealth: Payer: Self-pay | Admitting: Infectious Diseases

## 2023-12-09 NOTE — Telephone Encounter (Signed)
 Jason Wolf student services requested the latest office note via fax to [phone]. Please send to his attention.

## 2023-12-09 NOTE — Telephone Encounter (Signed)
 Office notes faxed

## 2023-12-10 ENCOUNTER — Ambulatory Visit: Admitting: Infectious Diseases

## 2023-12-20 ENCOUNTER — Ambulatory Visit (HOSPITAL_COMMUNITY)
Admission: RE | Admit: 2023-12-20 | Discharge: 2023-12-20 | Disposition: A | Discharge status: Home / Self Care | Source: Ambulatory Visit | Attending: Infectious Diseases | Admitting: Infectious Diseases

## 2023-12-20 DIAGNOSIS — B182 Chronic viral hepatitis C: Secondary | ICD-10-CM | POA: Diagnosis not present

## 2023-12-20 DIAGNOSIS — R932 Abnormal findings on diagnostic imaging of liver and biliary tract: Secondary | ICD-10-CM | POA: Diagnosis not present

## 2023-12-20 DIAGNOSIS — K760 Fatty (change of) liver, not elsewhere classified: Secondary | ICD-10-CM | POA: Diagnosis not present

## 2023-12-20 DIAGNOSIS — K709 Alcoholic liver disease, unspecified: Secondary | ICD-10-CM | POA: Diagnosis not present

## 2023-12-20 DIAGNOSIS — Z944 Liver transplant status: Secondary | ICD-10-CM | POA: Diagnosis not present

## 2023-12-30 ENCOUNTER — Other Ambulatory Visit: Payer: Self-pay

## 2023-12-30 ENCOUNTER — Encounter: Payer: Self-pay | Admitting: Infectious Diseases

## 2023-12-30 ENCOUNTER — Ambulatory Visit (INDEPENDENT_AMBULATORY_CARE_PROVIDER_SITE_OTHER): Admitting: Infectious Diseases

## 2023-12-30 VITALS — BP 130/77 | HR 68 | Ht 71.0 in | Wt 233.0 lb

## 2023-12-30 DIAGNOSIS — Z23 Encounter for immunization: Secondary | ICD-10-CM | POA: Insufficient documentation

## 2023-12-30 DIAGNOSIS — F17210 Nicotine dependence, cigarettes, uncomplicated: Secondary | ICD-10-CM

## 2023-12-30 DIAGNOSIS — Z79899 Other long term (current) drug therapy: Secondary | ICD-10-CM

## 2023-12-30 DIAGNOSIS — B182 Chronic viral hepatitis C: Secondary | ICD-10-CM | POA: Diagnosis not present

## 2023-12-30 DIAGNOSIS — E1165 Type 2 diabetes mellitus with hyperglycemia: Secondary | ICD-10-CM | POA: Diagnosis not present

## 2023-12-30 DIAGNOSIS — F32A Depression, unspecified: Secondary | ICD-10-CM | POA: Diagnosis not present

## 2023-12-30 NOTE — Progress Notes (Unsigned)
 [facility]                                      [address], [place], [place], [address]                                               Phn. [phone]; Fax: [phone]                                                               Date: 12/30/23 Reason for Visit: Hepatitis C fu/additional labs    HPI: Jason Wolf is a 43 y.o.old male with DM, HTN, IVDU/alcohol abuse, bipolar d/o, Rt UE abscess who I here for HCV fu.   Reports being diagnosed with hepatitis C in 2023 during routine blood screening. He is uncertain if diagnosis was based on antibody or RNA test. No treatment received for hepatitis C. No known liver disease in family. Interested to start treatment.   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 Denies recent alcohol use. Smokes cigarettes, approximately one pack every three days. Not currently sexually active and denies known h/o HCV in sexual partners.  Denies any hospitalizations related to liver disease, jaundice, ascites, GI bleeding, mental status changes, abdominal pain and acholic stool.   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  No acute complaints today   6/2 Discussed last lab work from 5/5 including US  findings confirming active HCV infection and need for additional labs for treatment guidance. Continues to smoke 5-7 cigarettes a day. No other changes since last visit. No complaints today.   ROS: Denies yellowish discoloration of sclera and skin, abdominal pain/distension, hematemesis, nausea, vomiting, diarrhea, constipation           Dcough, fever, chills, nightsweats, appetite/weight loss, recent hospitalizations, rashes, joint complaints, shortness of breath, chest pain, cough, headaches or GU complaints.   Current Outpatient Medications on File Prior to Visit  Medication Sig Dispense Refill    Calcium  Carbonate Antacid (TUMS CHEWY DELIGHTS PO) Take 1 tablet by mouth as needed (heartburn).     Continuous Blood Gluc Sensor (FREESTYLE LIBRE 3 SENSOR) MISC Change sensor every 14 days. 2 each 11   glucose monitoring kit (FREESTYLE) monitoring kit 1 each by Does not apply route 4 (four) times daily - after meals and at bedtime. 1 month Diabetic Testing Supplies for QAC-QHS accuchecks. 1 each 1   hydrOXYzine  (VISTARIL ) 25 MG capsule Take 25 mg by mouth 2 (two) times daily as needed for anxiety.     insulin  aspart (NOVOLOG  FLEXPEN) 100 UNIT/ML FlexPen Inject 15-20 Units into the skin three times daily and correctional scale as directed. 24 mL 3   insulin  glargine (SEMGLEE ) 100 UNIT/ML injection Inject 50 Units into the skin at bedtime.     Insulin  Pen Needle (PEN NEEDLES) 32G X 4 MM MISC Use as instructed. Inject into the skin 5 times per day 200 each 6   losartan  (COZAAR ) 25 MG tablet Take 25 mg by mouth  daily.     melatonin 5 MG TABS Take 5-10 mg by mouth at bedtime.     naproxen  (NAPROSYN ) 500 MG tablet Take 1 tablet (500 mg total) by mouth 2 (two) times daily as needed for moderate pain (pain score 4-6). 30 tablet 0   SEMGLEE , YFGN, 100 UNIT/ML Pen Inject into the skin.     tirzepatide  (MOUNJARO ) 2.5 MG/0.5ML Pen Inject 2.5 mg into the skin once a week. 2 mL 1   traZODone (DESYREL) 50 MG tablet Take 50 mg by mouth at bedtime.     No current facility-administered medications on file prior to visit.   Allergies  Allergen Reactions   Bee Venom Anaphylaxis   Wasp Venom Anaphylaxis   Yellow Jacket Venom Anaphylaxis   Codeine Nausea And Vomiting and Other (See Comments)    headaches   Past Medical History:  Diagnosis Date   Bipolar disorder (HCC)    DM (diabetes mellitus) (HCC)    ETOH abuse    Hepatitis C    Hypertension    Opiate abuse, episodic (HCC)    Past Surgical History:  Procedure Laterality Date   I & D EXTREMITY Right 02/20/2020   Procedure: IRRIGATION AND DEBRIDEMENT  HAND;  Surgeon: Oralia Bills, MD;  Location: [facility] OR;  Service: Orthopedics;  Laterality: Right;   KNEE ARTHROSCOPY     Social History   Socioeconomic History   Marital status: Single    Spouse name: Not on file   Number of children: Not on file   Years of education: Not on file   Highest education level: Not on file  Occupational History   Not on file  Tobacco Use   Smoking status: Every Day    Current packs/day: 1.00    Types: Cigarettes   Smokeless tobacco: Never  Vaping Use   Vaping status: Every Day  Substance and Sexual Activity   Alcohol use: No    Comment: in rehab   Drug use: Not Currently    Types: Methamphetamines    Comment: was in rehab and relapsed after 22 months. 02/19/20   Sexual activity: Not Currently  Other Topics Concern   Not on file  Social History Narrative   Not on file   Social Drivers of Health   Financial Resource Strain: Not on file  Food Insecurity: Not on file  Transportation Needs: Not on file  Physical Activity: Not on file  Stress: Not on file  Social Connections: Not on file  Intimate Partner Violence: Not on file   Family History  Problem Relation Age of Onset   Diabetes type II Mother    Diabetes Mother    Vitals BP 130/77   Pulse 68   Ht 5\' 11"  (1.803 m)   Wt 233 lb (105.7 kg)   SpO2 97%   BMI 32.50 kg/m   Gen:  no acute distress HEENT: Harvey/AT, no scleral icterus, no pale conjunctivae, hearing normal, oral mucosa moist Neck: Supple Cardio: Regular rate and rhythm Resp: Pulmonary effort normal on room air GI: nondistended GU: MSK - no pedal edema Skin: No rashes Neuro: Grossly non focal, awake, alert and oriented * 3 Psych: Calm, cooperative  Laboratory     Latest Ref Rng & Units 08/25/2023    6:48 PM 04/30/2023    3:56 AM 04/18/2023   10:34 AM  CBC  WBC 4.0 - 10.5 K/uL 11.9  9.4  8.2   Hemoglobin 13.0 - 17.0 g/dL 16.1  09.6  04.5  Hematocrit 39.0 - 52.0 % 43.9  39.2  41.9   Platelets 150 - 400 K/uL  467  347  414       Latest Ref Rng & Units 08/25/2023    6:48 PM 04/30/2023    3:56 AM 04/18/2023   11:51 AM  CMP  Glucose 70 - 99 mg/dL 102  725  79   BUN 6 - 20 mg/dL 18  7  <5   Creatinine 0.61 - 1.24 mg/dL 3.66  4.40  3.47   Sodium 135 - 145 mmol/L 130  135  140   Potassium 3.5 - 5.1 mmol/L 4.1  3.3  3.2   Chloride 98 - 111 mmol/L 95  102  106   CO2 22 - 32 mmol/L 22  23  21    Calcium  8.9 - 10.3 mg/dL 9.6  8.9  9.3   Total Protein 6.5 - 8.1 g/dL 8.3  7.7    Total Bilirubin 0.0 - 1.2 mg/dL 1.1  0.8    Alkaline Phos 38 - 126 U/L 73  56    AST 15 - 41 U/L 38  40    ALT 0 - 44 U/L 80  75     Outside labs 05/10/22  Hepatitis B surface ab <5, HIV ag/ab NR, RPR NR, Hep A IgM NR, Hep B surface ag NR, Hep B core ab NR, HCV ab Reactive  Imaging US  abdomen 08/25/23 IMPRESSION: 1. Increased hepatic echogenicity consistent with steatosis. 2. Normal sonographic appearance of the gallbladder and biliary tree. 3. Midline structures including the pancreas are obscured by overlying bowel gas.   Assessment/Plan: # Chronic Hepatitis C  Plan - additional labs as ordered including genotype - fu in 2 weeks pending labs for tx   # Smoking/Alcohol use/IVDU - Reports being clean from IVD for approx 3 years  - counseled on cutting down on smoking/cessation as well as alcohol use  # DM/HTN/Bipolar d/o - fu with PCP   I spent 15 minutes involved in face-to-face and non-face-to-face activities for this patient on the day of the visit. Professional time spent includes the following activities: Preparing to see the patient (review of tests),  Performing a medically appropriate examination and  evaluation , Ordering labs, Documenting clinical information in the EMR, Independently interpreting results (not separately reported), Communicating results to the patient, Counseling and educating the patient and Care coordination (not separately reported).   Patients questions were addressed and answered.    Of note, portions of this note may have been created with voice recognition software. While this note has been edited for accuracy, occasional wrong-word or 'sound-a-like' substitutions may have occurred due to the inherent limitations of voice recognition software.   Electronically signed by:  Terre Ferri, MD Infectious Diseases  Office phone [phone] Fax no. [phone]

## 2023-12-31 ENCOUNTER — Ambulatory Visit: Payer: Self-pay | Admitting: Infectious Diseases

## 2023-12-31 NOTE — Telephone Encounter (Signed)
 Called patient to relay results and recommendations, no answer. Left HIPAA compliant voicemail requesting callback.   Jason Wolf, BSN, RN

## 2024-01-04 LAB — HEPATITIS C GENOTYPE

## 2024-01-04 LAB — COMPREHENSIVE METABOLIC PANEL WITH GFR
AG Ratio: 1.4 (calc) (ref 1.0–2.5)
ALT: 61 U/L — ABNORMAL HIGH (ref 9–46)
AST: 32 U/L (ref 10–40)
Albumin: 4 g/dL (ref 3.6–5.1)
Alkaline phosphatase (APISO): 81 U/L (ref 36–130)
BUN: 11 mg/dL (ref 7–25)
CO2: 23 mmol/L (ref 20–32)
Calcium: 9 mg/dL (ref 8.6–10.3)
Chloride: 96 mmol/L — ABNORMAL LOW (ref 98–110)
Creat: 0.82 mg/dL (ref 0.60–1.29)
Globulin: 2.8 g/dL (ref 1.9–3.7)
Glucose, Bld: 596 mg/dL (ref 65–99)
Potassium: 4.1 mmol/L (ref 3.5–5.3)
Sodium: 131 mmol/L — ABNORMAL LOW (ref 135–146)
Total Bilirubin: 0.5 mg/dL (ref 0.2–1.2)
Total Protein: 6.8 g/dL (ref 6.1–8.1)
eGFR: 112 mL/min/{1.73_m2} (ref >=60)

## 2024-01-04 LAB — LIVER FIBROSIS, FIBROTEST-ACTITEST
ALT: 63 U/L — ABNORMAL HIGH (ref 9–46)
Alpha-2-Macroglobulin: 355 mg/dL — ABNORMAL HIGH (ref 106–279)
Apolipoprotein A1: 189 mg/dL — ABNORMAL HIGH (ref 94–176)
Bilirubin: 0.5 mg/dL (ref 0.2–1.2)
Fibrosis Score: 0.4
GGT: 77 U/L (ref 3–95)
Haptoglobin: 125 mg/dL (ref 43–212)
Necroinflammat ACT Score: 0.41
Reference ID: 5527514

## 2024-01-04 LAB — PROTIME-INR
INR: 1
Prothrombin Time: 10.5 s (ref 9.0–11.5)

## 2024-01-04 LAB — CBC
HCT: 42.8 % (ref 38.5–50.0)
Hemoglobin: 13.8 g/dL (ref 13.2–17.1)
MCH: 29.9 pg (ref 27.0–33.0)
MCHC: 32.2 g/dL (ref 32.0–36.0)
MCV: 92.8 fL (ref 80.0–100.0)
MPV: 10.1 fL (ref 7.5–12.5)
Platelets: 356 10*3/uL (ref 140–400)
RBC: 4.61 10*6/uL (ref 4.20–5.80)
RDW: 12.1 % (ref 11.0–15.0)
WBC: 7.5 10*3/uL (ref 3.8–10.8)

## 2024-01-04 LAB — HEPATITIS A ANTIBODY, TOTAL: Hepatitis A AB,Total: REACTIVE — AB

## 2024-01-09 ENCOUNTER — Telehealth: Payer: Self-pay

## 2024-01-09 NOTE — Telephone Encounter (Signed)
 Yes - thank you! Donna/Courtney - can you start PA for Mavyret x 8 weeks or Epclusa x 12 weeks? Thanks!

## 2024-01-09 NOTE — Telephone Encounter (Signed)
 Patient called office today to follow up on lab results/ treatment. Will forward message to MD to advise on treatment options. Patient would like call back once determination is made. Julien Odor, RMA

## 2024-01-10 ENCOUNTER — Telehealth: Payer: Self-pay

## 2024-01-10 ENCOUNTER — Other Ambulatory Visit: Payer: Self-pay

## 2024-01-10 ENCOUNTER — Other Ambulatory Visit (HOSPITAL_COMMUNITY): Payer: Self-pay

## 2024-01-10 ENCOUNTER — Other Ambulatory Visit: Payer: Self-pay | Admitting: Pharmacist

## 2024-01-10 DIAGNOSIS — B182 Chronic viral hepatitis C: Secondary | ICD-10-CM

## 2024-01-10 MED ORDER — MAVYRET 100-40 MG PO TABS
3.0000 | ORAL_TABLET | Freq: Every day | ORAL | 1 refills | Status: DC
  Filled 2024-01-10: qty 84, 28d supply, fill #0
  Filled 2024-02-05: qty 84, 28d supply, fill #1

## 2024-01-10 NOTE — Progress Notes (Signed)
 Specialty Pharmacy Initial Fill Coordination Note  Jason Wolf is a 43 y.o. male contacted today regarding initial fill of specialty medication(s) Glecaprevir-Pibrentasvir Herbalist)   Patient requested Courier to Provider Office   Delivery date: 01/14/24   Verified address: [address] [place] [address]   Medication will be filled on 01/13/24.   Patient is aware of 5.00 copayment.  Put on AR/ACCT

## 2024-01-10 NOTE — Telephone Encounter (Signed)
 Submitted a Prior Authorization request to [facility] for MAVYRET via CoverMyMeds. Will update once we receive a response.   PA ID: [id number]

## 2024-01-10 NOTE — Telephone Encounter (Signed)
 Received notification from [facility] regarding a prior authorization for MAVYRET. Authorization has been APPROVED from 01/10/24 to 07/08/24.   Per test claim, copay for 28 days supply is $5.00  Patient can fill through [facility]: [phone]   Authorization #  [id number]

## 2024-01-13 ENCOUNTER — Emergency Department (HOSPITAL_BASED_OUTPATIENT_CLINIC_OR_DEPARTMENT_OTHER)
Admission: EM | Admit: 2024-01-13 | Discharge: 2024-01-14 | Disposition: A | Discharge status: Home / Self Care | Attending: Emergency Medicine | Admitting: Emergency Medicine

## 2024-01-13 ENCOUNTER — Encounter (HOSPITAL_BASED_OUTPATIENT_CLINIC_OR_DEPARTMENT_OTHER): Payer: Self-pay

## 2024-01-13 ENCOUNTER — Other Ambulatory Visit: Payer: Self-pay

## 2024-01-13 DIAGNOSIS — F17200 Nicotine dependence, unspecified, uncomplicated: Secondary | ICD-10-CM | POA: Insufficient documentation

## 2024-01-13 DIAGNOSIS — T485X2A Poisoning by other anti-common-cold drugs, intentional self-harm, initial encounter: Secondary | ICD-10-CM | POA: Insufficient documentation

## 2024-01-13 DIAGNOSIS — Z794 Long term (current) use of insulin: Secondary | ICD-10-CM | POA: Insufficient documentation

## 2024-01-13 DIAGNOSIS — T50902A Poisoning by unspecified drugs, medicaments and biological substances, intentional self-harm, initial encounter: Secondary | ICD-10-CM | POA: Diagnosis not present

## 2024-01-13 DIAGNOSIS — R000 Tachycardia, unspecified: Secondary | ICD-10-CM | POA: Diagnosis not present

## 2024-01-13 DIAGNOSIS — E119 Type 2 diabetes mellitus without complications: Secondary | ICD-10-CM | POA: Diagnosis not present

## 2024-01-13 LAB — CBC
HCT: 38.4 % — ABNORMAL LOW (ref 39.0–52.0)
Hemoglobin: 13.8 g/dL (ref 13.0–17.0)
MCH: 29.9 pg (ref 26.0–34.0)
MCHC: 35.9 g/dL (ref 30.0–36.0)
MCV: 83.1 fL (ref 80.0–100.0)
Platelets: 371 10*3/uL (ref 150–400)
RBC: 4.62 MIL/uL (ref 4.22–5.81)
RDW: 12 % (ref 11.5–15.5)
WBC: 10.4 10*3/uL (ref 4.0–10.5)
nRBC: 0 % (ref 0.0–0.2)

## 2024-01-13 LAB — I-STAT VENOUS BLOOD GAS, ED
Acid-base deficit: 2 mmol/L (ref 0.0–2.0)
Bicarbonate: 21.2 mmol/L (ref 20.0–28.0)
Calcium, Ion: 1.1 mmol/L — ABNORMAL LOW (ref 1.15–1.40)
HCT: 39 % (ref 39.0–52.0)
Hemoglobin: 13.3 g/dL (ref 13.0–17.0)
O2 Saturation: 98 %
Patient temperature: 98.7
Potassium: 3.6 mmol/L (ref 3.5–5.1)
Sodium: 132 mmol/L — ABNORMAL LOW (ref 135–145)
TCO2: 22 mmol/L (ref 22–32)
pCO2, Ven: 32.7 mmHg — ABNORMAL LOW (ref 44–60)
pH, Ven: 7.42 (ref 7.25–7.43)
pO2, Ven: 104 mmHg — ABNORMAL HIGH (ref 32–45)

## 2024-01-13 LAB — CBG MONITORING, ED: Glucose-Capillary: 126 mg/dL — ABNORMAL HIGH (ref 70–99)

## 2024-01-13 MED ORDER — LORAZEPAM 2 MG/ML IJ SOLN
1.0000 mg | Freq: Once | INTRAMUSCULAR | Status: AC
  Administered 2024-01-14: 1 mg via INTRAVENOUS
  Filled 2024-01-13: qty 1

## 2024-01-13 MED ORDER — SODIUM CHLORIDE 0.9 % IV BOLUS
1000.0000 mL | Freq: Once | INTRAVENOUS | Status: AC
Start: 2024-01-13 — End: 2024-01-14

## 2024-01-13 NOTE — ED Notes (Signed)
 Placed a call to Poison Control at [phone] and spoke to [employer]. Per Edwina Gram, pt may present with symptoms including euphoria, drowsiness or agitation, tachycardia, hypertension, ataxia, dilated pupils, hallucinations, nystagmus, seizures, and diaphoresis.   Recommend fluids PRN for tachycardia. Give benzos PRN for agitation or seizures.   Recommend checking magnesium and potassium and correcting to high end of normal.  Recheck EKG in 4 hours. Recheck acetaminophen  level 4 hours post-ingestion if concerned about potential for self-harm.  Observe for at least 6 hours.  Recheck CBG to assess for hypoglycemia and give dextrose -containing fluids PRN for low blood glucose levels.   Poison Control will call back in a couple hours to check on pt status.

## 2024-01-13 NOTE — ED Provider Notes (Signed)
 [place] [facility] Provider Note   CSN: [id number] Arrival date & time: 01/13/24  2128     Patient presents with: Drug Overdose   Jason Wolf is a 43 y.o. male.  With a past medical history of IV drug abuse, bipolar disorder, chronic polysubstance abuse, daily smoking, insulin -dependent diabetes, bipolar disorder  who presents to the emergency department with drug overdose.  He has a past medical history of polysubstance abuse.  His mother and father at bedside.  He states that he took 4 boxes of Coricidin tablets today.  He has done this multiple times in the past. His father states that he has seen him high on this medication before but has never seen him so sweaty and the patient has been twitching which is abnormal.  Patient denies taking any other substances.  Patient is able to give most of history is somewhat confused.  He denies audiovisual hallucinations.  His family states that he is also very ataxic which is abnormal and he has not been able to walk tonight.  Patient denies chest pain back pain shortness of breath.  {Add pertinent medical, surgical, social history, OB history to WUJ:81191}  Drug Overdose      Prior to Admission medications   Medication Sig Start Date End Date Taking? Authorizing Provider  Calcium  Carbonate Antacid (TUMS CHEWY DELIGHTS PO) Take 1 tablet by mouth as needed (heartburn).    [provider]  Continuous Blood Gluc Sensor (FREESTYLE LIBRE 3 SENSOR) MISC Change sensor every 14 days. 05/18/22     Glecaprevir-Pibrentasvir (MAVYRET) 100-40 MG TABS Take 3 tablets by mouth daily with breakfast. 01/10/24   Kuppelweiser, Cassie L, RPH-CPP  glucose monitoring kit (FREESTYLE) monitoring kit 1 each by Does not apply route 4 (four) times daily - after meals and at bedtime. 1 month Diabetic Testing Supplies for QAC-QHS accuchecks. 02/23/20   Ghimire, Estil Heman, MD  hydrOXYzine  (VISTARIL ) 25 MG capsule Take 25 mg by  mouth 2 (two) times daily as needed for anxiety.    [provider]  insulin  aspart (NOVOLOG  FLEXPEN) 100 UNIT/ML FlexPen Inject 15-20 Units into the skin three times daily and correctional scale as directed. 05/18/22     insulin  glargine (SEMGLEE ) 100 UNIT/ML injection Inject 50 Units into the skin at bedtime.    [provider]  Insulin  Pen Needle (PEN NEEDLES) 32G X 4 MM MISC Use as instructed. Inject into the skin 5 times per day 06/29/20   Fleming, Zelda W, NP  losartan  (COZAAR ) 25 MG tablet Take 25 mg by mouth daily.    [provider]  melatonin 5 MG TABS Take 5-10 mg by mouth at bedtime.    [provider]  naproxen  (NAPROSYN ) 500 MG tablet Take 1 tablet (500 mg total) by mouth 2 (two) times daily as needed for moderate pain (pain score 4-6). 05/14/23   Rancour, Mara [place]in[place], MD  SEMGLEE , YFGN, 100 UNIT/ML Pen Inject into the skin. 04/04/23   [provider]  tirzepatide  (MOUNJARO ) 2.5 MG/0.5ML Pen Inject 2.5 mg into the skin once a week. 05/18/22     traZODone (DESYREL) 50 MG tablet Take 50 mg by mouth at bedtime. 04/12/23   [provider]    Allergies: Bee venom, Wasp venom, Yellow jacket venom, and Codeine    Review of Systems  Updated Vital Signs BP (!) 149/95 (BP Location: Right Arm)   Pulse (!) 107   Temp 99.8 F (37.7 C) (Oral)   Resp 18  SpO2 100%   Physical Exam Constitutional:      Appearance: He is diaphoretic.     Comments: Appears clinically intoxcated  HENT:     Head: Normocephalic and atraumatic.     Mouth/Throat:     Mouth: Mucous membranes are dry.   Eyes:     Extraocular Movements: Extraocular movements intact.     Pupils: Pupils are equal, round, and reactive to light.     Comments: Dilated pupils + nystagmus   Cardiovascular:     Rate and Rhythm: Tachycardia present.  Pulmonary:     Effort: Pulmonary effort is normal.     Breath sounds: Normal breath sounds.  Abdominal:     General: Abdomen is  flat.     Palpations: Abdomen is soft.   Musculoskeletal:     Cervical back: Normal range of motion.   Skin:    Comments: Flushed appearache   Neurological:     Mental Status: He is alert. He is disoriented.     Motor: No abnormal muscle tone or seizure activity.     Comments: ataxia    (all labs ordered are listed, but only abnormal results are displayed) Labs Reviewed  CBG MONITORING, ED - Abnormal; Notable for the following components:      Result Value   Glucose-Capillary 126 (*)    All other components within normal limits  COMPREHENSIVE METABOLIC PANEL WITH GFR  ETHANOL  CBC  URINE DRUG SCREEN  SALICYLATE LEVEL  ACETAMINOPHEN  LEVEL  MAGNESIUM  CBG MONITORING, ED    EKG: EKG Interpretation Date/Time:  Monday January 13 2024 21:40:27 EDT Ventricular Rate:  103 PR Interval:  157 QRS Duration:  103 QT Interval:  377 QTC Calculation: 494 R Axis:   23  Text Interpretation: Sinus tachycardia Atrial premature complex Probable left atrial enlargement Abnormal R-wave progression, early transition Borderline prolonged QT interval Baseline wander in lead(s) III No significant change since prior 1/25 Confirmed by Racheal Buddle [phone]) on 01/13/2024 9:54:45 PM  Radiology: No results found.  {Document cardiac monitor, telemetry assessment procedure when appropriate:32947} Procedures   Medications Ordered in the ED  sodium chloride  0.9 % bolus 1,000 mL (has no administration in time range)    Clinical Course as of 01/13/24 2241  Mon Jan 13, 2024  [id number] 43 year old male brought in by his parents after intentional overdose of dextromethorphan.  He was diaphoretic and tachycardic.  Does have clear sensorium though.  Getting lab work fluids.  Poison control consult.  Disposition per results of testing and response to treatment. [MB]    Clinical Course User Index [MB] Tonya Fredrickson, MD   {Click here for ABCD2, HEART and other calculators REFRESH Note before signing:1}                               Medical Decision Making Amount and/or Complexity of Data Reviewed Labs: ordered.   ***  {Document critical care time when appropriate  Document review of labs and clinical decision tools ie CHADS2VASC2, etc  Document your independent review of radiology images and any outside records  Document your discussion with family members, caretakers and with consultants  Document social determinants of health affecting pt's care  Document your decision making why or why not admission, treatments were needed:32947:::1}   Final diagnoses:  None    ED Discharge Orders     None

## 2024-01-13 NOTE — ED Triage Notes (Signed)
 Pt. Bib by parents states he took 4 boxes of dexamathorphan around 6:30/7pm to get high. Pt. Is diabetic type 2 cbg was 400 at 7:30pm. He took 20 units of insulin  to bring his blood sugar down. In triage monitor read 144 Pt. Also hx of hep C Father reports pt. Has a tick movement that he's never seen before after getting high Pt. Is Alert to person, place and was confused to time Pt. Reports right sided abd pain and diaphoretic. Pt. Has had no vomitng has been able to keep down 3 16oz glasses of water

## 2024-01-14 ENCOUNTER — Telehealth: Payer: Self-pay

## 2024-01-14 ENCOUNTER — Other Ambulatory Visit: Payer: Self-pay

## 2024-01-14 LAB — COMPREHENSIVE METABOLIC PANEL WITH GFR
ALT: 68 U/L — ABNORMAL HIGH (ref 0–44)
AST: 37 U/L (ref 15–41)
Albumin: 4.2 g/dL (ref 3.5–5.0)
Alkaline Phosphatase: 80 U/L (ref 38–126)
Anion gap: 13 (ref 5–15)
BUN: 9 mg/dL (ref 6–20)
CO2: 21 mmol/L — ABNORMAL LOW (ref 22–32)
Calcium: 8.8 mg/dL — ABNORMAL LOW (ref 8.9–10.3)
Chloride: 97 mmol/L — ABNORMAL LOW (ref 98–111)
Creatinine, Ser: 0.66 mg/dL (ref 0.61–1.24)
GFR, Estimated: >60 mL/min (ref >60)
Glucose, Bld: 106 mg/dL — ABNORMAL HIGH (ref 70–99)
Potassium: 3.6 mmol/L (ref 3.5–5.1)
Sodium: 131 mmol/L — ABNORMAL LOW (ref 135–145)
Total Bilirubin: 0.5 mg/dL (ref 0.0–1.2)
Total Protein: 7.2 g/dL (ref 6.5–8.1)

## 2024-01-14 LAB — URINE DRUG SCREEN
Amphetamines: DETECTED — AB
Barbiturates: NOT DETECTED
Benzodiazepines: NOT DETECTED
Cocaine: NOT DETECTED
Fentanyl: DETECTED — AB
Methadone Scn, Ur: NOT DETECTED
Opiates: NOT DETECTED
Tetrahydrocannabinol: NOT DETECTED

## 2024-01-14 LAB — SALICYLATE LEVEL: Salicylate Lvl: <7 mg/dL — ABNORMAL LOW (ref 7.0–30.0)

## 2024-01-14 LAB — AMMONIA: Ammonia: 30 umol/L (ref 9–35)

## 2024-01-14 LAB — ACETAMINOPHEN LEVEL: Acetaminophen (Tylenol), Serum: <10 ug/mL — ABNORMAL LOW (ref 10–30)

## 2024-01-14 LAB — ETHANOL: Alcohol, Ethyl (B): <15 mg/dL (ref <15)

## 2024-01-14 LAB — TROPONIN T, HIGH SENSITIVITY: Troponin T High Sensitivity: <15 ng/L (ref <19)

## 2024-01-14 LAB — MAGNESIUM: Magnesium: 1.6 mg/dL — ABNORMAL LOW (ref 1.7–2.4)

## 2024-01-14 NOTE — Discharge Instructions (Signed)
 Follow-up with primary doctor, and return to the emergency department if you experience any new and/or concerning issues.

## 2024-01-14 NOTE — Telephone Encounter (Signed)
 [facility] Patient Advocate Encounter  Patient's medications Mavyret have been couriered to [facility] from [facility] and will be  picked up on 01/16/24.  Roylene Corn, CPhT Specialty Pharmacy Patient [facility] for Infectious Disease Phone: [phone] Fax:  [phone]

## 2024-01-15 ENCOUNTER — Other Ambulatory Visit (HOSPITAL_COMMUNITY): Payer: Self-pay

## 2024-01-15 ENCOUNTER — Telehealth: Payer: Self-pay

## 2024-01-15 NOTE — Telephone Encounter (Signed)
 Mavyret counseling with father

## 2024-01-15 NOTE — Telephone Encounter (Signed)
 Jason Wolf's father arrived to clinic to retrieve his Mavyret prescription for HCV treatment. He was counseled on the importance of adherence to help clear the virus. He understands to take 3 tablets at once daily with food for 8 weeks. He was informed on the common side effects including: nausea, vomiting, headache, and fatigue. He will have Sylvia reach out to the clinic if these side effects do not resolve after the first 2 weeks on treatment. He was also informed of the risk of hypoglycemia on Mavyret. Advised to check BG as directed and watch for symptoms of low blood sugar (shaking, sweating, dizziness, lightheadedness, etc.)   Jason Wolf was scheduled for 1 month therapy follow up with Dr. Gillian Lacrosse. HCV RNA will be collected at that time.   Jason Wolf, PharmD [facility] PGY-1

## 2024-01-27 DIAGNOSIS — F411 Generalized anxiety disorder: Secondary | ICD-10-CM | POA: Diagnosis not present

## 2024-01-28 ENCOUNTER — Other Ambulatory Visit (HOSPITAL_COMMUNITY): Payer: Self-pay

## 2024-01-31 ENCOUNTER — Other Ambulatory Visit: Payer: Self-pay | Admitting: Pharmacist

## 2024-01-31 NOTE — Progress Notes (Signed)
 Specialty Pharmacy Initiation Note   Jason Wolf is a 43 y.o. male who will be followed by the specialty pharmacy service for RxSp Hepatitis C    Review of administration, indication, effectiveness, safety, potential side effects, storage/disposable, and missed dose instructions occurred today for patient's specialty medication(s) Glecaprevir -Pibrentasvir  (Mavyret )     Patient/Caregiver did not have any additional questions or concerns.   Patient's therapy is appropriate to: Initiate    Goals Addressed             This Visit's Progress    Achieve virologic cure as evidenced by SVR       Patient is initiating therapy. Patient will be evaluated at upcoming provider appointment to assess progress      Comply with lab assessments       Patient is on track. Patient will adhere to provider and/or lab appointments      Maintain optimal adherence to therapy       Patient is initiating therapy. Patient will work on increased adherence and be evaluated at upcoming provider appointment to assess progress         Alan JINNY Geralds Specialty Pharmacist

## 2024-02-03 ENCOUNTER — Other Ambulatory Visit: Payer: Self-pay

## 2024-02-05 ENCOUNTER — Other Ambulatory Visit: Payer: Self-pay | Admitting: Pharmacy Technician

## 2024-02-05 ENCOUNTER — Other Ambulatory Visit: Payer: Self-pay

## 2024-02-05 NOTE — Progress Notes (Signed)
 Specialty Pharmacy Refill Coordination Note  Jason Wolf is a 43 y.o. male contacted today regarding refills of specialty medication(s) Glecaprevir -Pibrentasvir  (Mavyret )   Patient requested Courier to Provider Office   Delivery date: 02/11/24   Verified address: [address] [place], [place] [address]   Medication will be filled on 02/10/24.

## 2024-02-07 DIAGNOSIS — E1165 Type 2 diabetes mellitus with hyperglycemia: Secondary | ICD-10-CM | POA: Diagnosis not present

## 2024-02-11 ENCOUNTER — Telehealth: Payer: Self-pay

## 2024-02-11 NOTE — Telephone Encounter (Signed)
 [facility] Patient Advocate Encounter  Patient's medications Mavyret  have been couriered to [facility] from [facility] and will be  picked up at the patients appointment on 02/17/24.  2nd Mavyret  box.  Arland Hutchinson, CPhT Specialty Pharmacy Patient [facility] for Infectious Disease Phone: [phone] Fax:  [phone]

## 2024-02-12 ENCOUNTER — Telehealth: Payer: Self-pay

## 2024-02-12 NOTE — Telephone Encounter (Signed)
 Medication was picked up at [facility] on 02/12/24 @2 :07PM   2ND BOX

## 2024-02-17 ENCOUNTER — Ambulatory Visit: Admitting: Infectious Diseases

## 2024-02-21 ENCOUNTER — Telehealth: Payer: Self-pay | Admitting: Infectious Diseases

## 2024-02-21 NOTE — Telephone Encounter (Signed)
 Jason Wolf called wondering if the previous appt he canceled requires a fasting lab. This will determine when he reschedules. Pt can be reached at [phone]

## 2024-02-21 NOTE — Telephone Encounter (Signed)
 Left vm informing pt that our office does not do fasting labs. Can reschedule appt.

## 2024-02-24 DIAGNOSIS — G4700 Insomnia, unspecified: Secondary | ICD-10-CM | POA: Diagnosis not present

## 2024-02-24 DIAGNOSIS — F411 Generalized anxiety disorder: Secondary | ICD-10-CM | POA: Diagnosis not present

## 2024-02-24 DIAGNOSIS — F32A Depression, unspecified: Secondary | ICD-10-CM | POA: Diagnosis not present

## 2024-02-25 ENCOUNTER — Other Ambulatory Visit: Payer: Self-pay

## 2024-02-25 ENCOUNTER — Ambulatory Visit (INDEPENDENT_AMBULATORY_CARE_PROVIDER_SITE_OTHER): Admitting: Infectious Diseases

## 2024-02-25 VITALS — BP 145/87 | HR 92 | Temp 99.1°F | Wt 237.0 lb

## 2024-02-25 DIAGNOSIS — Z79899 Other long term (current) drug therapy: Secondary | ICD-10-CM

## 2024-02-25 DIAGNOSIS — B182 Chronic viral hepatitis C: Secondary | ICD-10-CM

## 2024-02-25 DIAGNOSIS — F17210 Nicotine dependence, cigarettes, uncomplicated: Secondary | ICD-10-CM

## 2024-02-25 NOTE — Progress Notes (Signed)
 [facility]                                      [address], [place], [place], [address]                                               Phn. [phone]; Fax: [phone]                                                               Date: 02/25/24  Reason for Visit: Hepatitis C fu/labs     HPI: Jason Wolf is a 43 y.o.old male with DM, HTN, IVDU/alcohol abuse, bipolar d/o, Rt UE abscess who is here for HCV fu.   Reports being diagnosed with hepatitis C in 2023 during routine blood screening. He is uncertain if diagnosis was based on antibody or RNA test. No treatment received for hepatitis C. No known liver disease in family. Interested to start treatment.   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 Denies recent alcohol use. Smokes cigarettes, approximately one pack every three days. Not currently sexually active and denies known h/o HCV in sexual partners.  Denies any hospitalizations related to liver disease, jaundice, ascites, GI bleeding, mental status changes, abdominal pain and acholic stool.   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  No acute complaints today   6/2 Discussed last lab work from 5/5 including US  findings confirming active HCV infection and need for additional labs for treatment guidance. Continues to smoke 5-7 cigarettes a day. No other changes since last visit. No complaints today.   7/29 Seen in the ED on 6/16 for recreational drug overdose. Seen by PCP on 7/11 for management of DM and HTN, Compliant with Mavyret  with 20 more days left. No concerns or missed doses. Discussed plan for labs today and fu in 20 days for end of tx labs. No complaints.   ROS: all systems reviewed and negative.   Current Outpatient Medications on File Prior to Visit  Medication Sig Dispense Refill   Calcium  Carbonate Antacid  (TUMS CHEWY DELIGHTS PO) Take 1 tablet by mouth as needed (heartburn).     Continuous Blood Gluc Sensor (FREESTYLE LIBRE 3 SENSOR) MISC Change sensor every 14 days. 2 each 11   Glecaprevir -Pibrentasvir  (MAVYRET ) 100-40 MG TABS Take 3 tablets by mouth daily with breakfast. 84 tablet 1   glucose monitoring kit (FREESTYLE) monitoring kit 1 each by Does not apply route 4 (four) times daily - after meals and at bedtime. 1 month Diabetic Testing Supplies for QAC-QHS accuchecks. 1 each 1   hydrOXYzine  (VISTARIL ) 25 MG capsule Take 25 mg by mouth 2 (two) times daily as needed for anxiety.     insulin  aspart (NOVOLOG  FLEXPEN) 100 UNIT/ML FlexPen Inject 15-20 Units into the skin three times daily and correctional scale as directed. 24 mL 3   insulin  glargine (SEMGLEE ) 100 UNIT/ML injection Inject 50 Units into the skin at bedtime.     Insulin  Pen Needle (  PEN NEEDLES) 32G X 4 MM MISC Use as instructed. Inject into the skin 5 times per day 200 each 6   losartan  (COZAAR ) 25 MG tablet Take 25 mg by mouth daily.     melatonin 5 MG TABS Take 5-10 mg by mouth at bedtime.     naproxen  (NAPROSYN ) 500 MG tablet Take 1 tablet (500 mg total) by mouth 2 (two) times daily as needed for moderate pain (pain score 4-6). 30 tablet 0   SEMGLEE , YFGN, 100 UNIT/ML Pen Inject into the skin.     tirzepatide  (MOUNJARO ) 2.5 MG/0.5ML Pen Inject 2.5 mg into the skin once a week. 2 mL 1   traZODone (DESYREL) 50 MG tablet Take 50 mg by mouth at bedtime.     No current facility-administered medications on file prior to visit.   Allergies  Allergen Reactions   Bee Venom Anaphylaxis   Wasp Venom Anaphylaxis   Yellow Jacket Venom Anaphylaxis   Codeine Nausea And Vomiting and Other (See Comments)    headaches   Past Medical History:  Diagnosis Date   Bipolar disorder (HCC)    DM (diabetes mellitus) (HCC)    ETOH abuse    Hepatitis C    Hypertension    Opiate abuse, episodic (HCC)    Past Surgical History:  Procedure  Laterality Date   I & D EXTREMITY Right 02/20/2020   Procedure: IRRIGATION AND DEBRIDEMENT HAND;  Surgeon: Carolee Lynwood JINNY DOUGLAS, MD;  Location: [facility] OR;  Service: Orthopedics;  Laterality: Right;   KNEE ARTHROSCOPY     Social History   Socioeconomic History   Marital status: Single    Spouse name: Not on file   Number of children: Not on file   Years of education: Not on file   Highest education level: Not on file  Occupational History   Not on file  Tobacco Use   Smoking status: Every Day    Current packs/day: 1.00    Types: Cigarettes   Smokeless tobacco: Never  Vaping Use   Vaping status: Every Day  Substance and Sexual Activity   Alcohol use: No    Comment: in rehab   Drug use: Not Currently    Types: Methamphetamines    Comment: was in rehab and relapsed after 22 months. 02/19/20   Sexual activity: Not Currently  Other Topics Concern   Not on file  Social History Narrative   Not on file   Social Drivers of Health   Financial Resource Strain: Not on file  Food Insecurity: Not on file  Transportation Needs: Not on file  Physical Activity: Not on file  Stress: Not on file  Social Connections: Not on file  Intimate Partner Violence: Not on file   Family History  Problem Relation Age of Onset   Diabetes type II Mother    Diabetes Mother    Vitals BP (!) 145/87   Pulse 92   Temp 99.1 F (37.3 C) (Oral)   Wt 237 lb (107.5 kg)   SpO2 97%   BMI 33.05 kg/m    Gen:  no acute distress HEENT: [place]/AT, no scleral icterus, no pale conjunctivae, hearing normal, oral mucosa moist Neck: Supple Cardio: Regular rate and rhythm Resp: Pulmonary effort normal on room air GI: nondistended GU: MSK - no pedal edema Skin: No rashes Neuro: Grossly non focal, awake, alert and oriented * 3 Psych: Calm, cooperative  Laboratory     Latest Ref Rng & Units 01/13/2024   11:35 PM 01/13/2024  10:54 PM 12/30/2023    2:19 PM  CBC  WBC 4.0 - 10.5 K/uL  10.4  7.5   Hemoglobin  13.0 - 17.0 g/dL 86.6  86.1  86.1   Hematocrit 39.0 - 52.0 % 39.0  38.4  42.8   Platelets 150 - 400 K/uL  371  356       Latest Ref Rng & Units 01/13/2024   11:35 PM 01/13/2024   11:27 PM 12/30/2023    2:19 PM  CMP  Glucose 70 - 99 mg/dL  893  403   BUN 6 - 20 mg/dL  9  11   Creatinine 9.38 - 1.24 mg/dL  9.33  9.17   Sodium 864 - 145 mmol/L 132  131  131   Potassium 3.5 - 5.1 mmol/L 3.6  3.6  4.1   Chloride 98 - 111 mmol/L  97  96   CO2 22 - 32 mmol/L  21  23   Calcium  8.9 - 10.3 mg/dL  8.8  9.0   Total Protein 6.5 - 8.1 g/dL  7.2  6.8   Total Bilirubin 0.0 - 1.2 mg/dL  0.5  0.5   Alkaline Phos 38 - 126 U/L  80    AST 15 - 41 U/L  37  32   ALT 0 - 44 U/L  68  63    61    Outside labs 05/10/22  Hepatitis B surface ab <5, HIV ag/ab NR, RPR NR, Hep A IgM NR, Hep B surface ag NR, Hep B core ab NR, HCV ab Reactive  Imaging US  abdomen 08/25/23 IMPRESSION: 1. Increased hepatic echogenicity consistent with steatosis. 2. Normal sonographic appearance of the gallbladder and biliary tree. 3. Midline structures including the pancreas are obscured by overlying bowel gas.   Assessment/Plan: # Chronic Hepatitis C, tx naive, genotype 1a  Plan - complete remaining course of mavyret   - HCV RNA today  - fu in 20 days for end of tx HCV RNA  # Smoking/Alcohol use/IVDU - Reported being clean from IVD for approx 3 years  - Encouraged on cutting down on smoking/cessation as well as alcohol use  # DM/HTN/Bipolar d/o - fu with PCP   I spent 20 minutes involved in face-to-face and non-face-to-face activities for this patient on the day of the visit. Professional time spent includes the following activities: Preparing to see the patient (review of tests),  Performing a medically appropriate examination and  evaluation , Ordering labs, Documenting clinical information in the EMR, Independently interpreting results (not separately reported), Communicating results to the patient, Counseling and  educating the patient and Care coordination (not separately reported).   Patients questions were addressed and answered.   Of note, portions of this note may have been created with voice recognition software. While this note has been edited for accuracy, occasional wrong-word or 'sound-a-like' substitutions may have occurred due to the inherent limitations of voice recognition software.   Electronically signed by:  [name] Orem, MD Infectious Diseases  Office phone [phone] Fax no. [phone]

## 2024-02-27 LAB — HEPATITIS C RNA QUANTITATIVE
HCV Quantitative Log: <1.18 {Log_IU}/mL
HCV RNA, PCR, QN: <15 [IU]/mL

## 2024-02-28 ENCOUNTER — Ambulatory Visit: Payer: Self-pay | Admitting: Infectious Diseases

## 2024-03-02 ENCOUNTER — Other Ambulatory Visit: Payer: Self-pay

## 2024-03-02 ENCOUNTER — Other Ambulatory Visit: Payer: Self-pay | Admitting: Pharmacy Technician

## 2024-03-17 ENCOUNTER — Ambulatory Visit: Admitting: Infectious Diseases

## 2024-03-19 ENCOUNTER — Telehealth: Payer: Self-pay

## 2024-03-19 NOTE — Telephone Encounter (Signed)
 Left a voice mail to call back to schedule an appointment with Dr. Dea after receiving a request from patient to discuss finishing medications.

## 2024-03-25 DIAGNOSIS — F411 Generalized anxiety disorder: Secondary | ICD-10-CM | POA: Diagnosis not present

## 2024-03-25 DIAGNOSIS — F32A Depression, unspecified: Secondary | ICD-10-CM | POA: Diagnosis not present

## 2024-03-25 DIAGNOSIS — G4700 Insomnia, unspecified: Secondary | ICD-10-CM | POA: Diagnosis not present

## 2024-04-02 DIAGNOSIS — M25532 Pain in left wrist: Secondary | ICD-10-CM | POA: Diagnosis not present

## 2024-04-02 DIAGNOSIS — M79632 Pain in left forearm: Secondary | ICD-10-CM | POA: Diagnosis not present

## 2024-04-02 DIAGNOSIS — R202 Paresthesia of skin: Secondary | ICD-10-CM | POA: Diagnosis not present

## 2024-04-03 DIAGNOSIS — S40022A Contusion of left upper arm, initial encounter: Secondary | ICD-10-CM | POA: Diagnosis not present

## 2024-04-09 ENCOUNTER — Observation Stay (HOSPITAL_COMMUNITY)
Admission: EM | Admit: 2024-04-09 | Discharge: 2024-04-11 | Disposition: A | Discharge status: Home / Self Care | Attending: Internal Medicine | Admitting: Internal Medicine

## 2024-04-09 ENCOUNTER — Emergency Department (HOSPITAL_COMMUNITY)

## 2024-04-09 DIAGNOSIS — F319 Bipolar disorder, unspecified: Secondary | ICD-10-CM | POA: Diagnosis not present

## 2024-04-09 DIAGNOSIS — I10 Essential (primary) hypertension: Secondary | ICD-10-CM | POA: Diagnosis not present

## 2024-04-09 DIAGNOSIS — E11649 Type 2 diabetes mellitus with hypoglycemia without coma: Secondary | ICD-10-CM | POA: Diagnosis not present

## 2024-04-09 DIAGNOSIS — B182 Chronic viral hepatitis C: Secondary | ICD-10-CM | POA: Insufficient documentation

## 2024-04-09 DIAGNOSIS — E162 Hypoglycemia, unspecified: Secondary | ICD-10-CM | POA: Diagnosis present

## 2024-04-09 DIAGNOSIS — F17210 Nicotine dependence, cigarettes, uncomplicated: Secondary | ICD-10-CM | POA: Diagnosis not present

## 2024-04-09 DIAGNOSIS — Z794 Long term (current) use of insulin: Secondary | ICD-10-CM | POA: Diagnosis not present

## 2024-04-09 DIAGNOSIS — R55 Syncope and collapse: Secondary | ICD-10-CM | POA: Diagnosis not present

## 2024-04-09 DIAGNOSIS — E876 Hypokalemia: Secondary | ICD-10-CM | POA: Insufficient documentation

## 2024-04-09 DIAGNOSIS — I4581 Long QT syndrome: Secondary | ICD-10-CM | POA: Diagnosis not present

## 2024-04-09 DIAGNOSIS — G253 Myoclonus: Secondary | ICD-10-CM | POA: Diagnosis not present

## 2024-04-09 DIAGNOSIS — E1165 Type 2 diabetes mellitus with hyperglycemia: Secondary | ICD-10-CM

## 2024-04-09 DIAGNOSIS — G9341 Metabolic encephalopathy: Secondary | ICD-10-CM | POA: Insufficient documentation

## 2024-04-09 DIAGNOSIS — R4182 Altered mental status, unspecified: Principal | ICD-10-CM | POA: Insufficient documentation

## 2024-04-09 DIAGNOSIS — E119 Type 2 diabetes mellitus without complications: Secondary | ICD-10-CM

## 2024-04-09 DIAGNOSIS — R531 Weakness: Secondary | ICD-10-CM | POA: Diagnosis not present

## 2024-04-09 DIAGNOSIS — R4781 Slurred speech: Secondary | ICD-10-CM | POA: Diagnosis not present

## 2024-04-09 DIAGNOSIS — I63233 Cerebral infarction due to unspecified occlusion or stenosis of bilateral carotid arteries: Secondary | ICD-10-CM | POA: Diagnosis not present

## 2024-04-09 LAB — I-STAT CHEM 8, ED
BUN: 7 mg/dL (ref 6–20)
Calcium, Ion: 1.03 mmol/L — ABNORMAL LOW (ref 1.15–1.40)
Chloride: 108 mmol/L (ref 98–111)
Creatinine, Ser: 0.9 mg/dL (ref 0.61–1.24)
Glucose, Bld: 117 mg/dL — ABNORMAL HIGH (ref 70–99)
HCT: 40 % (ref 39.0–52.0)
Hemoglobin: 13.6 g/dL (ref 13.0–17.0)
Potassium: 3.3 mmol/L — ABNORMAL LOW (ref 3.5–5.1)
Sodium: 142 mmol/L (ref 135–145)
TCO2: 26 mmol/L (ref 22–32)

## 2024-04-09 LAB — CBG MONITORING, ED: Glucose-Capillary: 133 mg/dL — ABNORMAL HIGH (ref 70–99)

## 2024-04-09 LAB — I-STAT CG4 LACTIC ACID, ED: Lactic Acid, Venous: 0.6 mmol/L (ref 0.5–1.9)

## 2024-04-09 NOTE — Consult Note (Incomplete)
 NEUROLOGY CONSULT NOTE   Date of service: April 09, 2024 Patient Name: Jason Wolf MRN:  [id number] DOB:  [birth year] Chief Complaint: *** Requesting Provider: Haze Lonni PARAS, *  History of Present Illness  Jason Wolf is a 43 y.o. male with a PMHx of bipolar disorder, DM, EtOH abuse (states he is now sober), former drug abuse (states he is no longer taking illicit drugs), hepatitis C and HTN who presents via EMS to the ED from his residence after a syncopal episode followed by confusion. LKN was 10:15 PM. He fainted at 10:30 PM, was unconscious for about 30 seconds and on awakening was confused with slurred speech, per roommates. On EMS arrival CBG was 511. EMS noted some left sided weakness, confusion, dysarthria and intermittent whole-body jerking movements.   LKW: 10:15 PM Modified rankin score: 0 IV Thrombolysis: ***Yes, *** No (reason) EVT: ***Yes, *** No (reason)   NIHSS components Score: Comment  1a Level of Conscious 0[]  1[x]  2[]  3[]      1b LOC Questions 0[]  1[x]  2[]      Not oriented to the month, the year or day. Oriented to the city and state.   1c LOC Commands 0[x]  1[]  2[]       2 Best Gaze 0[x]  1[]  2[]       3 Visual 0[x]  1[]  2[]  3[]      4 Facial Palsy 0[x]  1[]  2[]  3[]      5a Motor Arm - left 0[]  1[x]  2[]  3[]  4[]  UN[]    5b Motor Arm - Right 0[x]  1[]  2[]  3[]  4[]  UN[]    6a Motor Leg - Left 0[]  1[x]  2[]  3[]  4[]  UN[]    6b Motor Leg - Right 0[x]  1[]  2[]  3[]  4[]  UN[]    7 Limb Ataxia 0[x]  1[]  2[]  UN[]      8 Sensory 0[x]  1[]  2[]  UN[]      9 Best Language 0[]  1[x]  2[]  3[]      10 Dysarthria 0[]  1[x]  2[]  UN[]      11 Extinct. and Inattention 0[x]  1[]  2[]       TOTAL:   6    Other: Full body myoclonic jerk x 1 seen at the bridge a few minutes after arrival.    ROS  Unable to obtain due to confusion.   Past History   Past Medical History:  Diagnosis Date  . Bipolar disorder (HCC)   . DM (diabetes mellitus) (HCC)   . ETOH abuse   . Hepatitis C   . Hypertension    . Opiate abuse, episodic [facility])     Past Surgical History:  Procedure Laterality Date  . I & D EXTREMITY Right 02/20/2020   Procedure: IRRIGATION AND DEBRIDEMENT HAND;  Surgeon: Carolee Lynwood PARAS DOUGLAS, MD;  Location: [facility] OR;  Service: Orthopedics;  Laterality: Right;  . KNEE ARTHROSCOPY      Family History: Family History  Problem Relation Age of Onset  . Diabetes type II Mother   . Diabetes Mother     Social History  reports that he has been smoking cigarettes. He has never used smokeless tobacco. He reports that he does not currently use drugs after having used the following drugs: Methamphetamines. He reports that he does not drink alcohol.  Allergies  Allergen Reactions  . Bee Venom Anaphylaxis  . Wasp Venom Anaphylaxis  . Yellow Jacket Venom Anaphylaxis  . Codeine Nausea And Vomiting and Other (See Comments)    headaches    Medications  No current facility-administered medications for this encounter.  Current Outpatient Medications:  .  Calcium   Carbonate Antacid (TUMS CHEWY DELIGHTS PO), Take 1 tablet by mouth as needed (heartburn)., Disp: , Rfl:  .  Continuous Blood Gluc Sensor (FREESTYLE LIBRE 3 SENSOR) MISC, Change sensor every 14 days., Disp: 2 each, Rfl: 11 .  Glecaprevir -Pibrentasvir  (MAVYRET ) 100-40 MG TABS, Take 3 tablets by mouth daily with breakfast., Disp: 84 tablet, Rfl: 1 .  glucose monitoring kit (FREESTYLE) monitoring kit, 1 each by Does not apply route 4 (four) times daily - after meals and at bedtime. 1 month Diabetic Testing Supplies for QAC-QHS accuchecks., Disp: 1 each, Rfl: 1 .  hydrOXYzine  (VISTARIL ) 25 MG capsule, Take 25 mg by mouth 2 (two) times daily as needed for anxiety., Disp: , Rfl:  .  insulin  aspart (NOVOLOG  FLEXPEN) 100 UNIT/ML FlexPen, Inject 15-20 Units into the skin three times daily and correctional scale as directed., Disp: 24 mL, Rfl: 3 .  insulin  glargine (SEMGLEE ) 100 UNIT/ML injection, Inject 50 Units into the skin at bedtime.,  Disp: , Rfl:  .  Insulin  Pen Needle (PEN NEEDLES) 32G X 4 MM MISC, Use as instructed. Inject into the skin 5 times per day, Disp: 200 each, Rfl: 6 .  losartan  (COZAAR ) 25 MG tablet, Take 25 mg by mouth daily., Disp: , Rfl:  .  melatonin 5 MG TABS, Take 5-10 mg by mouth at bedtime., Disp: , Rfl:  .  naproxen  (NAPROSYN ) 500 MG tablet, Take 1 tablet (500 mg total) by mouth 2 (two) times daily as needed for moderate pain (pain score 4-6)., Disp: 30 tablet, Rfl: 0 .  SEMGLEE , YFGN, 100 UNIT/ML Pen, Inject into the skin., Disp: , Rfl:  .  tirzepatide  (MOUNJARO ) 2.5 MG/0.5ML Pen, Inject 2.5 mg into the skin once a week., Disp: 2 mL, Rfl: 1 .  traZODone  (DESYREL ) 50 MG tablet, Take 50 mg by mouth at bedtime., Disp: , Rfl:   Vitals   Vitals:   04/23/24 2300  Weight: 106 kg  Height: 52025-09-25 (1.803 m)    Body mass index is 32.59 kg/m.   Physical Exam   Constitutional: Appears well-developed and well-nourished. *** Psych: Affect appropriate to situation. *** Eyes: No scleral injection. *** HENT: No OP obstruction. *** Head: Normocephalic. *** Cardiovascular: Normal rate and regular rhythm. *** Respiratory: Effort normal, non-labored breathing. *** GI: Soft.  No distension. There is no tenderness. *** Skin: WDI. ***  Neurologic Examination   ***  Labs/Imaging/Neurodiagnostic studies   CBC:  Recent Labs  Lab 2024-04-23 2339  HGB 13.6  HCT 40.0   Basic Metabolic Panel:  Lab Results  Component Value Date   NA 142 Apr 23, 2024   K 3.3 (L) April 23, 2024   CO2 21 (L) 01/13/2024   GLUCOSE 117 (H) 23-Apr-2024   BUN 7 23-Apr-2024   CREATININE 0.90 23-Apr-2024   CALCIUM  8.8 (L) 01/13/2024   GFRNONAA >60 01/13/2024   GFRAA 137 06/29/2020   Lipid Panel:  Lab Results  Component Value Date   LDLCALC UNABLE TO CALCULATE IF TRIGLYCERIDE OVER 400 mg/dL 90/83/7977   YhaJ8r:  Lab Results  Component Value Date   HGBA1C 10.0 (H) 04/30/2023   Urine Drug Screen:     Component Value Date/Time    LABOPIA NONE DETECTED 01/13/2024 2352   COCAINSCRNUR NONE DETECTED 01/13/2024 2352   LABBENZ NONE DETECTED 01/13/2024 2352   AMPHETMU DETECTED (A) 01/13/2024 2352   THCU NONE DETECTED 01/13/2024 2352   LABBARB NONE DETECTED 01/13/2024 2352    Alcohol Level     Component Value Date/Time   ETH <15 01/13/2024  2327   INR  Lab Results  Component Value Date   INR 1.0 12/30/2023   APTT  Lab Results  Component Value Date   APTT 25 04/13/2021     ASSESSMENT   Jason Wolf is a 43 y.o. male ***  RECOMMENDATIONS  *** ______________________________________________________________________    Bonney SHARK, Ariane Ditullio, MD [facility]

## 2024-04-09 NOTE — Consult Note (Incomplete)
 NEUROLOGY CONSULT NOTE   Date of service: April 09, 2024 Patient Name: Jason Wolf MRN:  [id number] DOB:  1980/11/26 Chief Complaint: *** Requesting Provider: Haze Lonni PARAS, *  History of Present Illness  Jason Wolf is a 43 y.o. male with hx of ***  LKW: *** Modified rankin score: {Modified Rankin Scale:21264} IV Thrombolysis: ***Yes, *** No (reason) EVT: ***Yes, *** No (reason) ICH Score:***  NIHSS components Score: Comment  1a Level of Conscious 0[]  1[]  2[]  3[]      1b LOC Questions 0[]  1[]  2[]       1c LOC Commands 0[]  1[]  2[]       2 Best Gaze 0[]  1[]  2[]       3 Visual 0[]  1[]  2[]  3[]      4 Facial Palsy 0[]  1[]  2[]  3[]      5a Motor Arm - left 0[]  1[]  2[]  3[]  4[]  UN[]    5b Motor Arm - Right 0[]  1[]  2[]  3[]  4[]  UN[]    6a Motor Leg - Left 0[]  1[]  2[]  3[]  4[]  UN[]    6b Motor Leg - Right 0[]  1[]  2[]  3[]  4[]  UN[]    7 Limb Ataxia 0[]  1[]  2[]  UN[]      8 Sensory 0[]  1[]  2[]  UN[]      9 Best Language 0[]  1[]  2[]  3[]      10 Dysarthria 0[]  1[]  2[]  UN[]      11 Extinct. and Inattention 0[]  1[]  2[]       TOTAL:       ROS  ***Comprehensive ROS performed and pertinent positives documented in HPI  ***Unable to ascertain due to ***  Past History   Past Medical History:  Diagnosis Date   Bipolar disorder (HCC)    DM (diabetes mellitus) (HCC)    ETOH abuse    Hepatitis C    Hypertension    Opiate abuse, episodic (HCC)     Past Surgical History:  Procedure Laterality Date   I & D EXTREMITY Right 02/20/2020   Procedure: IRRIGATION AND DEBRIDEMENT HAND;  Surgeon: Carolee Lynwood PARAS DOUGLAS, MD;  Location: [facility] OR;  Service: Orthopedics;  Laterality: Right;   KNEE ARTHROSCOPY      Family History: Family History  Problem Relation Age of Onset   Diabetes type II Mother    Diabetes Mother     Social History  reports that he has been smoking cigarettes. He has never used smokeless tobacco. He reports that he does not currently use drugs after having used the following  drugs: Methamphetamines. He reports that he does not drink alcohol.  Allergies  Allergen Reactions   Bee Venom Anaphylaxis   Wasp Venom Anaphylaxis   Yellow Jacket Venom Anaphylaxis   Codeine Nausea And Vomiting and Other (See Comments)    headaches    Medications  No current facility-administered medications for this encounter.  Current Outpatient Medications:    Calcium  Carbonate Antacid (TUMS CHEWY DELIGHTS PO), Take 1 tablet by mouth as needed (heartburn)., Disp: , Rfl:    Continuous Blood Gluc Sensor (FREESTYLE LIBRE 3 SENSOR) MISC, Change sensor every 14 days., Disp: 2 each, Rfl: 11   Glecaprevir -Pibrentasvir  (MAVYRET ) 100-40 MG TABS, Take 3 tablets by mouth daily with breakfast., Disp: 84 tablet, Rfl: 1   glucose monitoring kit (FREESTYLE) monitoring kit, 1 each by Does not apply route 4 (four) times daily - after meals and at bedtime. 1 month Diabetic Testing Supplies for QAC-QHS accuchecks., Disp: 1 each, Rfl: 1   hydrOXYzine  (VISTARIL ) 25 MG capsule, Take 25 mg by mouth 2 (two)  times daily as needed for anxiety., Disp: , Rfl:    insulin  aspart (NOVOLOG  FLEXPEN) 100 UNIT/ML FlexPen, Inject 15-20 Units into the skin three times daily and correctional scale as directed., Disp: 24 mL, Rfl: 3   insulin  glargine (SEMGLEE ) 100 UNIT/ML injection, Inject 50 Units into the skin at bedtime., Disp: , Rfl:    Insulin  Pen Needle (PEN NEEDLES) 32G X 4 MM MISC, Use as instructed. Inject into the skin 5 times per day, Disp: 200 each, Rfl: 6   losartan  (COZAAR ) 25 MG tablet, Take 25 mg by mouth daily., Disp: , Rfl:    melatonin 5 MG TABS, Take 5-10 mg by mouth at bedtime., Disp: , Rfl:    naproxen  (NAPROSYN ) 500 MG tablet, Take 1 tablet (500 mg total) by mouth 2 (two) times daily as needed for moderate pain (pain score 4-6)., Disp: 30 tablet, Rfl: 0   SEMGLEE , YFGN, 100 UNIT/ML Pen, Inject into the skin., Disp: , Rfl:    tirzepatide  (MOUNJARO ) 2.5 MG/0.5ML Pen, Inject 2.5 mg into the skin once a  week., Disp: 2 mL, Rfl: 1   traZODone  (DESYREL ) 50 MG tablet, Take 50 mg by mouth at bedtime., Disp: , Rfl:   Vitals   Vitals:   04/29/2024 2300  Weight: 106 kg  Height: 52025/10/01 (1.803 m)    Body mass index is 32.59 kg/m.   Physical Exam   Constitutional: Appears well-developed and well-nourished. *** Psych: Affect appropriate to situation. *** Eyes: No scleral injection. *** HENT: No OP obstruction. *** Head: Normocephalic. *** Cardiovascular: Normal rate and regular rhythm. *** Respiratory: Effort normal, non-labored breathing. *** GI: Soft.  No distension. There is no tenderness. *** Skin: WDI. ***  Neurologic Examination   ***  Labs/Imaging/Neurodiagnostic studies   CBC:  Recent Labs  Lab Apr 29, 2024 2339  HGB 13.6  HCT 40.0   Basic Metabolic Panel:  Lab Results  Component Value Date   NA 142 29-Apr-2024   K 3.3 (L) 04-29-2024   CO2 21 (L) 01/13/2024   GLUCOSE 117 (H) 2024-04-29   BUN 7 2024-04-29   CREATININE 0.90 Apr 29, 2024   CALCIUM  8.8 (L) 01/13/2024   GFRNONAA >60 01/13/2024   GFRAA 137 06/29/2020   Lipid Panel:  Lab Results  Component Value Date   LDLCALC UNABLE TO CALCULATE IF TRIGLYCERIDE OVER 400 mg/dL 90/83/7977   YhaJ8r:  Lab Results  Component Value Date   HGBA1C 10.0 (H) 04/30/2023   Urine Drug Screen:     Component Value Date/Time   LABOPIA NONE DETECTED 01/13/2024 2352   COCAINSCRNUR NONE DETECTED 01/13/2024 2352   LABBENZ NONE DETECTED 01/13/2024 2352   AMPHETMU DETECTED (A) 01/13/2024 2352   THCU NONE DETECTED 01/13/2024 2352   LABBARB NONE DETECTED 01/13/2024 2352    Alcohol Level     Component Value Date/Time   [facility] <15 01/13/2024 2327   INR  Lab Results  Component Value Date   INR 1.0 12/30/2023   APTT  Lab Results  Component Value Date   APTT 25 04/13/2021     ASSESSMENT   Jason Wolf is a 43 y.o. male ***  RECOMMENDATIONS   *** ______________________________________________________________________    Bonney SHARK, Jasmond River, MD [facility]

## 2024-04-10 ENCOUNTER — Observation Stay (HOSPITAL_COMMUNITY)

## 2024-04-10 ENCOUNTER — Emergency Department (HOSPITAL_COMMUNITY)

## 2024-04-10 ENCOUNTER — Other Ambulatory Visit: Payer: Self-pay

## 2024-04-10 ENCOUNTER — Encounter (HOSPITAL_COMMUNITY): Payer: Self-pay | Admitting: Emergency Medicine

## 2024-04-10 DIAGNOSIS — E876 Hypokalemia: Secondary | ICD-10-CM

## 2024-04-10 DIAGNOSIS — E162 Hypoglycemia, unspecified: Secondary | ICD-10-CM | POA: Diagnosis not present

## 2024-04-10 DIAGNOSIS — R531 Weakness: Secondary | ICD-10-CM | POA: Diagnosis not present

## 2024-04-10 DIAGNOSIS — R4182 Altered mental status, unspecified: Secondary | ICD-10-CM

## 2024-04-10 DIAGNOSIS — G9341 Metabolic encephalopathy: Secondary | ICD-10-CM

## 2024-04-10 DIAGNOSIS — E1165 Type 2 diabetes mellitus with hyperglycemia: Secondary | ICD-10-CM | POA: Diagnosis not present

## 2024-04-10 DIAGNOSIS — I6782 Cerebral ischemia: Secondary | ICD-10-CM | POA: Diagnosis not present

## 2024-04-10 DIAGNOSIS — R569 Unspecified convulsions: Secondary | ICD-10-CM

## 2024-04-10 DIAGNOSIS — I63233 Cerebral infarction due to unspecified occlusion or stenosis of bilateral carotid arteries: Secondary | ICD-10-CM | POA: Diagnosis not present

## 2024-04-10 DIAGNOSIS — G4089 Other seizures: Secondary | ICD-10-CM

## 2024-04-10 DIAGNOSIS — R55 Syncope and collapse: Secondary | ICD-10-CM

## 2024-04-10 LAB — CBG MONITORING, ED
Glucose-Capillary: 100 mg/dL — ABNORMAL HIGH (ref 70–99)
Glucose-Capillary: 102 mg/dL — ABNORMAL HIGH (ref 70–99)
Glucose-Capillary: 174 mg/dL — ABNORMAL HIGH (ref 70–99)
Glucose-Capillary: 190 mg/dL — ABNORMAL HIGH (ref 70–99)
Glucose-Capillary: 62 mg/dL — ABNORMAL LOW (ref 70–99)
Glucose-Capillary: 63 mg/dL — ABNORMAL LOW (ref 70–99)
Glucose-Capillary: 66 mg/dL — ABNORMAL LOW (ref 70–99)
Glucose-Capillary: 80 mg/dL (ref 70–99)
Glucose-Capillary: 82 mg/dL (ref 70–99)
Glucose-Capillary: 88 mg/dL (ref 70–99)
Glucose-Capillary: 89 mg/dL (ref 70–99)

## 2024-04-10 LAB — COMPREHENSIVE METABOLIC PANEL WITH GFR
ALT: 25 U/L (ref 0–44)
AST: 32 U/L (ref 15–41)
Albumin: 3.9 g/dL (ref 3.5–5.0)
Alkaline Phosphatase: 52 U/L (ref 38–126)
Anion gap: 13 (ref 5–15)
BUN: 7 mg/dL (ref 6–20)
CO2: 24 mmol/L (ref 22–32)
Calcium: 8.9 mg/dL (ref 8.9–10.3)
Chloride: 105 mmol/L (ref 98–111)
Creatinine, Ser: 0.78 mg/dL (ref 0.61–1.24)
GFR, Estimated: >60 mL/min (ref >60)
Glucose, Bld: 57 mg/dL — ABNORMAL LOW (ref 70–99)
Potassium: 3.4 mmol/L — ABNORMAL LOW (ref 3.5–5.1)
Sodium: 142 mmol/L (ref 135–145)
Total Bilirubin: 0.6 mg/dL (ref 0.0–1.2)
Total Protein: 6.9 g/dL (ref 6.5–8.1)

## 2024-04-10 LAB — RAPID URINE DRUG SCREEN, HOSP PERFORMED
Amphetamines: NOT DETECTED
Barbiturates: NOT DETECTED
Benzodiazepines: NOT DETECTED
Cocaine: NOT DETECTED
Opiates: NOT DETECTED
Tetrahydrocannabinol: NOT DETECTED

## 2024-04-10 LAB — DIFFERENTIAL
Abs Immature Granulocytes: 0.02 K/uL (ref 0.00–0.07)
Basophils Absolute: 0.1 K/uL (ref 0.0–0.1)
Basophils Relative: 1 %
Eosinophils Absolute: 0.1 K/uL (ref 0.0–0.5)
Eosinophils Relative: 1 %
Immature Granulocytes: 0 %
Lymphocytes Relative: 28 %
Lymphs Abs: 2.2 K/uL (ref 0.7–4.0)
Monocytes Absolute: 0.5 K/uL (ref 0.1–1.0)
Monocytes Relative: 7 %
Neutro Abs: 5.1 K/uL (ref 1.7–7.7)
Neutrophils Relative %: 63 %

## 2024-04-10 LAB — MRSA NEXT GEN BY PCR, NASAL: MRSA by PCR Next Gen: NOT DETECTED

## 2024-04-10 LAB — PROTIME-INR
INR: 1.1 (ref 0.8–1.2)
Prothrombin Time: 14.3 s (ref 11.4–15.2)

## 2024-04-10 LAB — URINALYSIS, ROUTINE W REFLEX MICROSCOPIC
Bilirubin Urine: NEGATIVE
Glucose, UA: 150 mg/dL — AB
Hgb urine dipstick: NEGATIVE
Ketones, ur: NEGATIVE mg/dL
Leukocytes,Ua: NEGATIVE
Nitrite: NEGATIVE
Protein, ur: NEGATIVE mg/dL
Specific Gravity, Urine: 1.035 — ABNORMAL HIGH (ref 1.005–1.030)
pH: 6 (ref 5.0–8.0)

## 2024-04-10 LAB — GLUCOSE, CAPILLARY: Glucose-Capillary: 187 mg/dL — ABNORMAL HIGH (ref 70–99)

## 2024-04-10 LAB — CBC
HCT: 40 % (ref 39.0–52.0)
Hemoglobin: 14.1 g/dL (ref 13.0–17.0)
MCH: 30.3 pg (ref 26.0–34.0)
MCHC: 35.3 g/dL (ref 30.0–36.0)
MCV: 86 fL (ref 80.0–100.0)
Platelets: 313 K/uL (ref 150–400)
RBC: 4.65 MIL/uL (ref 4.22–5.81)
RDW: 12.1 % (ref 11.5–15.5)
WBC: 8 K/uL (ref 4.0–10.5)
nRBC: 0 % (ref 0.0–0.2)

## 2024-04-10 LAB — APTT: aPTT: 28 s (ref 24–36)

## 2024-04-10 LAB — MAGNESIUM: Magnesium: 2 mg/dL (ref 1.7–2.4)

## 2024-04-10 LAB — ETHANOL: Alcohol, Ethyl (B): <15 mg/dL (ref <15)

## 2024-04-10 LAB — AMMONIA
Ammonia: 68 umol/L — ABNORMAL HIGH (ref 9–35)
Ammonia: 46 umol/L — ABNORMAL HIGH (ref 9–35)

## 2024-04-10 LAB — POTASSIUM: Potassium: 3.5 mmol/L (ref 3.5–5.1)

## 2024-04-10 LAB — HEMOGLOBIN A1C
Hgb A1c MFr Bld: 7 % — ABNORMAL HIGH (ref 4.8–5.6)
Mean Plasma Glucose: 154.2 mg/dL

## 2024-04-10 LAB — BETA-HYDROXYBUTYRIC ACID: Beta-Hydroxybutyric Acid: 0.13 mmol/L (ref 0.05–0.27)

## 2024-04-10 MED ORDER — DEXTROSE 50 % IV SOLN: 12.5000 g | INTRAVENOUS | Status: DC | PRN

## 2024-04-10 MED ORDER — LOSARTAN POTASSIUM 25 MG PO TABS
50.0000 mg | ORAL_TABLET | Freq: Every day | ORAL | Status: DC
  Administered 2024-04-10 – 2024-04-11 (×2): 50 mg via ORAL
  Filled 2024-04-10: qty 1
  Filled 2024-04-10: qty 2

## 2024-04-10 MED ORDER — ENOXAPARIN SODIUM 40 MG/0.4ML IJ SOSY
40.0000 mg | PREFILLED_SYRINGE | INTRAMUSCULAR | Status: DC
  Administered 2024-04-10: 40 mg via SUBCUTANEOUS
  Filled 2024-04-10: qty 0.4

## 2024-04-10 MED ORDER — TRAZODONE HCL 50 MG PO TABS
50.0000 mg | ORAL_TABLET | Freq: Every day | ORAL | Status: DC
  Administered 2024-04-10: 50 mg via ORAL
  Filled 2024-04-10: qty 1

## 2024-04-10 MED ORDER — HYDROXYZINE HCL 25 MG PO TABS: 25.0000 mg | ORAL_TABLET | Freq: Two times a day (BID) | ORAL | Status: DC | PRN

## 2024-04-10 MED ORDER — DEXTROSE 50 % IV SOLN
25.0000 mL | Freq: Once | INTRAVENOUS | Status: AC
  Administered 2024-04-10: 25 mL via INTRAVENOUS
  Filled 2024-04-10: qty 50

## 2024-04-10 MED ORDER — HYDROXYZINE PAMOATE 25 MG PO CAPS: 25.0000 mg | ORAL_CAPSULE | Freq: Two times a day (BID) | ORAL | Status: DC | PRN

## 2024-04-10 MED ORDER — IOHEXOL 350 MG/ML SOLN
75.0000 mL | Freq: Once | INTRAVENOUS | Status: AC | PRN
  Administered 2024-04-10: 75 mL via INTRAVENOUS

## 2024-04-10 MED ORDER — ORAL CARE MOUTH RINSE: 15.0000 mL | OROMUCOSAL | Status: DC | PRN

## 2024-04-10 MED ORDER — ACETAMINOPHEN 325 MG PO TABS: 650.0000 mg | ORAL_TABLET | Freq: Four times a day (QID) | ORAL | Status: DC | PRN

## 2024-04-10 MED ORDER — DEXTROSE 5 % IV SOLN: INTRAVENOUS | Status: DC

## 2024-04-10 MED ORDER — DEXTROSE 10 % IV SOLN: INTRAVENOUS | Status: DC

## 2024-04-10 MED ORDER — ACETAMINOPHEN 650 MG RE SUPP: 650.0000 mg | Freq: Four times a day (QID) | RECTAL | Status: DC | PRN

## 2024-04-10 MED ORDER — DEXTROSE 5 % IV SOLN: Freq: Once | INTRAVENOUS | Status: AC

## 2024-04-10 MED ORDER — SERTRALINE HCL 100 MG PO TABS
100.0000 mg | ORAL_TABLET | Freq: Every day | ORAL | Status: DC
Start: 2024-04-10 — End: 2024-04-11
  Administered 2024-04-10 – 2024-04-11 (×2): 100 mg via ORAL
  Filled 2024-04-10 (×2): qty 1

## 2024-04-10 MED ORDER — POTASSIUM CHLORIDE CRYS ER 20 MEQ PO TBCR
40.0000 meq | EXTENDED_RELEASE_TABLET | Freq: Once | ORAL | Status: AC
  Administered 2024-04-10: 40 meq via ORAL
  Filled 2024-04-10: qty 2

## 2024-04-10 NOTE — Inpatient Diabetes Management (Addendum)
 Inpatient Diabetes Program Recommendations  AACE/ADA: New Consensus Statement on Inpatient Glycemic Control (2015)  Target Ranges:  Prepandial:   less than 140 mg/dL      Peak postprandial:   less than 180 mg/dL (1-2 hours)      Critically ill patients:  140 - 180 mg/dL   Lab Results  Component Value Date   GLUCAP 174 (H) 04/10/2024   HGBA1C 7.0 (H) 04/10/2024    Review of Glycemic Control  Diabetes history: type 1 Outpatient Diabetes medications: Semglee  6 units in am, 54 units at HS, Novolog  15-20 units scale TID with meals, Mounjaro  7.5 mg weekly Current orders for Inpatient glycemic control: none yet  Inpatient Diabetes Program Recommendations:   Spoke with patient at the bedside. Patient was diagnosed with diabetes many years ago. He has most recently been on Semglee  and Novolog  dosages. He sees NP Harlene Bill at [facility]. 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 States that he has been eating normally.   Patient states that he has not been having many low blood sugars at home. He has a Dexcom G7 continuous glucose monitor at home that he uses to check blood sugars. He was very pleased to know that his HgbA1C was 7.0%. encouraged him to get in touch with the NP next week and that he may need to adjust his insulin  dosage if blood sugars continue to be low. He did state that he had recently ended treatment for Hepatitis C, so he didn't know if that may have changed his blood sugars. He also has lost 125 pounds in the last year or so.   Will continue to monitor blood sugars while in the hospital.  Marjorie Lunger RN BSN CDE Diabetes Coordinator Pager: [phone]  8am-5pm

## 2024-04-10 NOTE — ED Notes (Signed)
 Patient transported to MRI. New bag of D5 hung and infusion continued for MRI.

## 2024-04-10 NOTE — ED Triage Notes (Signed)
 Pt arrive by EMS from home, last seen well at 2215, and had a syncope episode at 22:30 lasted for 30 sec. Then having slurred speech, left side weakness. BP 188/110, HR 78, SPO2 98%.

## 2024-04-10 NOTE — ED Notes (Signed)
 Went to get vitals, patient clammy, responsive to verbal stimuli and states he is fatigues. CBG found to be 102, with patient eating full past two meals. Provider contacted with request to restart D5 drip. Provider suggested giving OJ, so 8 oz given, but concerns expressed due to falling CBG with him having eaten full meals with no insulin  doses today.

## 2024-04-10 NOTE — H&P (Signed)
 History and Physical    Jason Wolf FMW:[id number] DOB: 11-17-1980 DOA: 04/09/2024  PCP: Almarie Donnice Boas, MD  Patient coming from: Home  Chief Complaint: AMS  HPI: Jason Wolf is a 43 y.o. male with medical history significant of insulin -dependent type 2 diabetes, hypertension, previous IV drug abuse, alcohol use disorder in remission, tobacco abuse, bipolar disorder, chronic hepatitis C presented to ED via EMS from his residence after syncopal episode followed by confusion.  LKN was 10:15 PM.  He fainted at 10:30 pm, was unconscious for about 30 seconds, and on awakening was confused with slurred speech per roommates.  On EMS arrival, CBG was 511.  EMS noted some left-sided weakness, confusion, dysarthria, and intermittent whole body jerking movements.  Patient reportedly took an unknown dose of long-acting insulin  prior to arrival of EMS.  Code stroke was initiated and patient was seen by neurology on arrival.  CT head negative for acute intracranial abnormality. CT angio head and neck negative for LVO.  Neurology felt that his presentation was more consistent with a metabolic abnormality rather than acute stroke.  Patient noted to be hypoglycemic in the ED with glucose down to the 50s and was given D50.  Potassium 3.4, ammonia level 68 (sample hemolyzed), UDS negative, UA not suggestive of infection.    Patient states he is on insulin  for his diabetes and due to his last A1c being >14, his doctor has gradually been increasing the dose of his long-acting insulin  Semglee .  He is currently on Semglee  54 units at bedtime and 6 units in the morning.  Also takes NovoLog  sliding scale insulin  with meals.  States yesterday his glucose was low in the 50s during the day so he was trying to eat food throughout the day and at nighttime he took his usual dose of Semglee  54 units after which he had an episode of syncope.  Denies history of seizures.  He was otherwise feeling well prior to his syncopal event  tonight and denies fevers, chills, cough, shortness of breath, chest pain, nausea, vomiting, abdominal pain, or diarrhea.  Denies drug or alcohol use.  Review of Systems:  Review of Systems  All other systems reviewed and are negative.   Past Medical History:  Diagnosis Date   Bipolar disorder (HCC)    DM (diabetes mellitus) (HCC)    ETOH abuse    Hepatitis C    Hypertension    Opiate abuse, episodic (HCC)     Past Surgical History:  Procedure Laterality Date   I & D EXTREMITY Right 02/20/2020   Procedure: IRRIGATION AND DEBRIDEMENT HAND;  Surgeon: Carolee Lynwood JINNY DOUGLAS, MD;  Location: [facility] OR;  Service: Orthopedics;  Laterality: Right;   KNEE ARTHROSCOPY       reports that he has been smoking cigarettes. He has never used smokeless tobacco. He reports that he does not currently use drugs after having used the following drugs: Methamphetamines. He reports that he does not drink alcohol.  Allergies  Allergen Reactions   Bee Venom Anaphylaxis   Wasp Venom Anaphylaxis   Yellow Jacket Venom Anaphylaxis   Codeine Nausea And Vomiting and Other (See Comments)    headaches    Family History  Problem Relation Age of Onset   Diabetes type II Mother    Diabetes Mother     Prior to Admission medications   Medication Sig Start Date End Date Taking? Authorizing Provider  Calcium  Carbonate Antacid (TUMS CHEWY DELIGHTS PO) Take 1 tablet by mouth as needed (heartburn).  Yes [provider]  hydrOXYzine  (VISTARIL ) 25 MG capsule Take 25 mg by mouth 2 (two) times daily as needed for anxiety.   Yes [provider]  insulin  aspart (NOVOLOG  FLEXPEN) 100 UNIT/ML FlexPen Inject 15-20 Units into the skin three times daily and correctional scale as directed. 05/18/22  Yes   insulin  glargine (SEMGLEE ) 100 UNIT/ML injection Inject 6-54 Units into the skin See admin instructions. Inject 6 units into the skin in the morning and 54 units at night   Yes [provider]   losartan  (COZAAR ) 50 MG tablet Take 50 mg by mouth daily. 03/24/24  Yes [provider]  meloxicam (MOBIC) 15 MG tablet Take 15 mg by mouth daily. 04/03/24  Yes [provider]  MOUNJARO  7.5 MG/0.5ML Pen Inject 7.5 mg into the skin every Friday.   Yes [provider]  sertraline  (ZOLOFT ) 100 MG tablet Take 100 mg by mouth daily.   Yes [provider]  tirzepatide  (MOUNJARO ) 2.5 MG/0.5ML Pen Inject 2.5 mg into the skin once a week. Patient taking differently: Inject 7.5 mg into the skin once a week. 05/18/22  Yes   traZODone  (DESYREL ) 50 MG tablet Take 50 mg by mouth at bedtime. 04/12/23  Yes [provider]  Continuous Blood Gluc Sensor (FREESTYLE LIBRE 3 SENSOR) MISC Change sensor every 14 days. 05/18/22     Glecaprevir -Pibrentasvir  (MAVYRET ) 100-40 MG TABS Take 3 tablets by mouth daily with breakfast. Patient not taking: Reported on 04/10/2024 01/10/24   Kuppelweiser, Cassie L, RPH-CPP  glucose monitoring kit (FREESTYLE) monitoring kit 1 each by Does not apply route 4 (four) times daily - after meals and at bedtime. 1 month Diabetic Testing Supplies for QAC-QHS accuchecks. 02/23/20   Ghimire, Donalda HERO, MD  Insulin  Pen Needle (PEN NEEDLES) 32G X 4 MM MISC Use as instructed. Inject into the skin 5 times per day 06/29/20   Theotis Haze ORN, NP    Physical Exam: Vitals:   04/10/24 0215 04/10/24 0245 04/10/24 0300 04/10/24 0400  BP: (!) 164/95 (!) 123/98 (!) 147/84 (!) 149/80  Pulse: 90 87 84 83  Resp: 18   18  Temp:   98.5 F (36.9 C) 98.7 F (37.1 C)  TempSrc:   Oral Oral  SpO2: 100% 100% 100% (!) 18%  Weight:      Height:        Physical Exam Vitals reviewed.  Constitutional:      General: He is not in acute distress. HENT:     Head: Normocephalic and atraumatic.  Eyes:     Extraocular Movements: Extraocular movements intact.  Cardiovascular:     Rate and Rhythm: Normal rate and regular rhythm.     Heart sounds: Normal heart sounds.   Pulmonary:     Effort: Pulmonary effort is normal. No respiratory distress.     Breath sounds: Normal breath sounds.  Abdominal:     General: Bowel sounds are normal. There is no distension.     Palpations: Abdomen is soft.     Tenderness: There is no abdominal tenderness.  Musculoskeletal:     Cervical back: Normal range of motion.     Right lower leg: No edema.     Left lower leg: No edema.  Skin:    General: Skin is warm and dry.  Neurological:     General: No focal deficit present.     Mental Status: He is alert and oriented to person, place, and time.     Cranial Nerves: No  cranial nerve deficit.     Sensory: No sensory deficit.     Motor: No weakness.     Labs on Admission: I have personally reviewed following labs and imaging studies  CBC: Recent Labs  Lab 04/09/24 2339 04/10/24 0125  WBC  --  8.0  NEUTROABS  --  5.1  HGB 13.6 14.1  HCT 40.0 40.0  MCV  --  86.0  PLT  --  313   Basic Metabolic Panel: Recent Labs  Lab 04/09/24 2339 04/10/24 0125  NA 142 142  K 3.3* 3.4*  CL 108 105  CO2  --  24  GLUCOSE 117* 57*  BUN 7 7  CREATININE 0.90 0.78  CALCIUM   --  8.9   GFR: Estimated Creatinine Clearance: 147.5 mL/min (by C-G formula based on SCr of 0.78 mg/dL). Liver Function Tests: Recent Labs  Lab 04/10/24 0125  AST 32  ALT 25  ALKPHOS 52  BILITOT 0.6  PROT 6.9  ALBUMIN 3.9   No results for input(s): LIPASE, AMYLASE in the last 168 hours. Recent Labs  Lab 04/10/24 0125  AMMONIA 68*   Coagulation Profile: No results for input(s): INR, PROTIME in the last 168 hours. Cardiac Enzymes: No results for input(s): CKTOTAL, CKMB, CKMBINDEX, TROPONINI in the last 168 hours. BNP (last 3 results) No results for input(s): PROBNP in the last 8760 hours. HbA1C: No results for input(s): HGBA1C in the last 72 hours. CBG: Recent Labs  Lab 04/10/24 0004 04/10/24 0023 04/10/24 0059 04/10/24 0203 04/10/24 0353  GLUCAP 100* 88 63*  89 62*   Lipid Profile: No results for input(s): CHOL, HDL, LDLCALC, TRIG, CHOLHDL, LDLDIRECT in the last 72 hours. Thyroid Function Tests: No results for input(s): TSH, T4TOTAL, FREET4, T3FREE, THYROIDAB in the last 72 hours. Anemia Panel: No results for input(s): VITAMINB12, FOLATE, FERRITIN, TIBC, IRON, RETICCTPCT in the last 72 hours. Urine analysis:    Component Value Date/Time   COLORURINE YELLOW 04/10/2024 0139   APPEARANCEUR HAZY (A) 04/10/2024 0139   APPEARANCEUR Clear 11/10/2014 1132   LABSPEC 1.035 (H) 04/10/2024 0139   LABSPEC 1.011 11/10/2014 1132   PHURINE 6.0 04/10/2024 0139   GLUCOSEU 150 (A) 04/10/2024 0139   GLUCOSEU Negative 11/10/2014 1132   HGBUR NEGATIVE 04/10/2024 0139   BILIRUBINUR NEGATIVE 04/10/2024 0139   BILIRUBINUR Negative 11/10/2014 1132   KETONESUR NEGATIVE 04/10/2024 0139   PROTEINUR NEGATIVE 04/10/2024 0139   NITRITE NEGATIVE 04/10/2024 0139   LEUKOCYTESUR NEGATIVE 04/10/2024 0139   LEUKOCYTESUR Negative 11/10/2014 1132    Radiological Exams on Admission: CT HEAD CODE STROKE WO CONTRAST Result Date: 04/10/2024 CLINICAL DATA:  Code stroke. Initial evaluation for acute neuro deficit, stroke. EXAM: CT HEAD WITHOUT CONTRAST CT ANGIOGRAPHY HEAD AND NECK TECHNIQUE: Multidetector CT imaging of the head and neck was performed using the standard protocol during bolus administration of intravenous contrast. Multiplanar CT image reconstructions and MIPs were obtained to evaluate the vascular anatomy. Carotid stenosis measurements (when applicable) are obtained utilizing NASCET criteria, using the distal internal carotid diameter as the denominator. RADIATION DOSE REDUCTION: This exam was performed according to the departmental dose-optimization program which includes automated exposure control, adjustment of the mA and/or kV according to patient size and/or use of iterative reconstruction technique. CONTRAST:  Please see contrast  documentation. COMPARISON:  Prior study from 04/13/2021. FINDINGS: CT HEAD FINDINGS Brain: Cerebral volume within normal limits for patient age. No acute intracranial hemorrhage. No acute large vessel territory infarct. No mass lesion, midline shift, or mass effect. Ventricles are  normal in size without hydrocephalus. No extra-axial fluid collection. Vascular: No abnormal hyperdense vessel. Skull: Scalp soft tissues demonstrate no acute abnormality. Calvarium intact. Sinuses/Orbits: Globes and orbital soft tissues within normal limits. Mild scattered mucosal thickening about the ethmoidal air cells. Paranasal sinuses are otherwise clear. No mastoid effusion. ASPECTS [facility] Stroke Program Early CT Score) - Ganglionic level infarction (caudate, lentiform nuclei, internal capsule, insula, M1-M3 cortex): 7 - Supraganglionic infarction (M4-M6 cortex): 3 Total score (0-10 with 10 being normal): 10 CTA NECK FINDINGS Aortic arch: Standard branching. Imaged portion shows no evidence of aneurysm or dissection. No significant stenosis of the major arch vessel origins. Right carotid system: Right common and internal carotid arteries are patent without visible dissection. Mild atheromatous change about the right carotid bulb without stenosis. Left carotid system: Left common and internal carotid arteries are patent without visible dissection. Atheromatous change about the left carotid bulb with associated stenosis of up to 50% by NASCET criteria (series 5, image 195). Vertebral arteries: Both vertebral arteries arise from subclavian arteries. Right vertebral artery dominant. Vertebral arteries patent without stenosis or dissection. Skeleton: No worrisome osseous lesions.  Patient is edentulous. Other neck: No other acute finding. Upper chest: No other acute finding. Review of the MIP images confirms the above findings CTA HEAD FINDINGS Anterior circulation: Both internal carotid arteries widely patent to the termini without  stenosis. A1 segments widely patent. Normal anterior communicating artery complex. Both anterior cerebral arteries widely patent to their distal aspects without stenosis. No M1 stenosis or occlusion. Normal MCA bifurcations. Distal MCA branches well perfused and symmetric. Posterior circulation: Both V4 segments patent without stenosis. Right PICA patent. Left PICA not seen. Basilar patent without stenosis. Superior cerebral arteries patent bilaterally. Left PCA supplied via the basilar. Fetal type origin right PCA. Both PCAs patent without significant stenosis. Venous sinuses: Grossly patent allowing for timing the contrast bolus. Anatomic variants: As above.  No aneurysm. Review of the MIP images confirms the above findings IMPRESSION: CT HEAD: 1. Negative head CT.  No acute intracranial abnormality. 2. Aspects = 10. CTA HEAD AND NECK: 1. Negative CTA for large vessel occlusion or other emergent finding. 2. 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 3. Additional mild atheromatous change about the right carotid bulb without stenosis. These results were communicated to Dr. Lindzen at 12:15 am and again at 12:18 a.m. on 04/10/2024 by text page via the [facility] messaging system. Electronically Signed   By: Morene Hoard M.D.   On: 04/10/2024 00:32   CT ANGIO HEAD NECK W WO CM (CODE STROKE) Result Date: 04/10/2024 CLINICAL DATA:  Code stroke. Initial evaluation for acute neuro deficit, stroke. EXAM: CT HEAD WITHOUT CONTRAST CT ANGIOGRAPHY HEAD AND NECK TECHNIQUE: Multidetector CT imaging of the head and neck was performed using the standard protocol during bolus administration of intravenous contrast. Multiplanar CT image reconstructions and MIPs were obtained to evaluate the vascular anatomy. Carotid stenosis measurements (when applicable) are obtained utilizing NASCET criteria, using the distal internal carotid diameter as the denominator.  RADIATION DOSE REDUCTION: This exam was performed according to the departmental dose-optimization program which includes automated exposure control, adjustment of the mA and/or kV according to patient size and/or use of iterative reconstruction technique. CONTRAST:  Please see contrast documentation. COMPARISON:  Prior study from 04/13/2021. FINDINGS: CT HEAD FINDINGS Brain: Cerebral volume within normal limits for patient age. No acute intracranial hemorrhage. No acute large vessel territory infarct. No mass lesion,  midline shift, or mass effect. Ventricles are normal in size without hydrocephalus. No extra-axial fluid collection. Vascular: No abnormal hyperdense vessel. Skull: Scalp soft tissues demonstrate no acute abnormality. Calvarium intact. Sinuses/Orbits: Globes and orbital soft tissues within normal limits. Mild scattered mucosal thickening about the ethmoidal air cells. Paranasal sinuses are otherwise clear. No mastoid effusion. ASPECTS [facility] Stroke Program Early CT Score) - Ganglionic level infarction (caudate, lentiform nuclei, internal capsule, insula, M1-M3 cortex): 7 - Supraganglionic infarction (M4-M6 cortex): 3 Total score (0-10 with 10 being normal): 10 CTA NECK FINDINGS Aortic arch: Standard branching. Imaged portion shows no evidence of aneurysm or dissection. No significant stenosis of the major arch vessel origins. Right carotid system: Right common and internal carotid arteries are patent without visible dissection. Mild atheromatous change about the right carotid bulb without stenosis. Left carotid system: Left common and internal carotid arteries are patent without visible dissection. Atheromatous change about the left carotid bulb with associated stenosis of up to 50% by NASCET criteria (series 5, image 195). Vertebral arteries: Both vertebral arteries arise from subclavian arteries. Right vertebral artery dominant. Vertebral arteries patent without stenosis or dissection. Skeleton: No  worrisome osseous lesions.  Patient is edentulous. Other neck: No other acute finding. Upper chest: No other acute finding. Review of the MIP images confirms the above findings CTA HEAD FINDINGS Anterior circulation: Both internal carotid arteries widely patent to the termini without stenosis. A1 segments widely patent. Normal anterior communicating artery complex. Both anterior cerebral arteries widely patent to their distal aspects without stenosis. No M1 stenosis or occlusion. Normal MCA bifurcations. Distal MCA branches well perfused and symmetric. Posterior circulation: Both V4 segments patent without stenosis. Right PICA patent. Left PICA not seen. Basilar patent without stenosis. Superior cerebral arteries patent bilaterally. Left PCA supplied via the basilar. Fetal type origin right PCA. Both PCAs patent without significant stenosis. Venous sinuses: Grossly patent allowing for timing the contrast bolus. Anatomic variants: As above.  No aneurysm. Review of the MIP images confirms the above findings IMPRESSION: CT HEAD: 1. Negative head CT.  No acute intracranial abnormality. 2. Aspects = 10. CTA HEAD AND NECK: 1. Negative CTA for large vessel occlusion or other emergent finding. 2. 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 3. Additional mild atheromatous change about the right carotid bulb without stenosis. These results were communicated to Dr. Lindzen at 12:15 am and again at 12:18 a.m. on 04/10/2024 by text page via the [facility] messaging system. Electronically Signed   By: Morene Hoard M.D.   On: 04/10/2024 00:32    EKG: Independently reviewed.  Sinus rhythm, incomplete RBBB, QTc 539.  No significant change since previous tracing.  Assessment and Plan  Recurrent hypoglycemia in the setting of insulin -dependent type 2 diabetes Patient reports CBG in the 50s during daytime yesterday and was trying to eat food and took his evening  dose of Semglee  54 units after which he had an episode of syncope.  CBG reportedly >500 with EMS but in the ED he was noted to be hypoglycemic with CBG down to 50s.  Initially given D50 but due to recurrent hypoglycemia now on D5 drip.  Hold home insulin  and monitor CBG every 2 hours.  Regular diet ordered.  Last A1c 10.0 in October 2024, repeat ordered.  Syncope/acute metabolic encephalopathy Possibly related to hypoglycemia.  Patient is currently awake and alert and has no focal neurodeficit on exam.  CT head negative for acute intracranial abnormality.  CT angio head and neck negative for LVO.  Neurology felt that his presentation was more consistent with a metabolic abnormality rather than acute stroke.  Ammonia level 68 but sample hemolyzed and repeat ordered to confirm.  UDS negative and UA not suggestive of infection.  Will defer decision regarding EEG to neurology.  Mild hypokalemia QT prolongation Monitor potassium and magnesium levels, continue to replace as needed.  Avoid QT prolonging drugs.  Hypertension Continue losartan .  Bipolar disorder Continue sertraline , trazodone , and hydroxyzine  PRN.  Chronic hepatitis C Followed by ID and completed course of Mavyret .  Viral load was undetectable on labs done 02/25/2024.  DVT prophylaxis: Lovenox  Code Status: Full Code (discussed with the patient) Family Communication: No family available at this time. Consults called: Neurology Level of care: Progressive Care Unit Admission status: It is my clinical opinion that referral for OBSERVATION is reasonable and necessary in this patient based on the above information provided. The aforementioned taken together are felt to place the patient at high risk for further clinical deterioration. However, it is anticipated that the patient may be medically stable for discharge from the hospital within 24 to 48 hours.  Editha Ram MD [facility]  If 7PM-7AM, please contact  night-coverage [email or web address]  04/10/2024, 6:04 AM

## 2024-04-10 NOTE — Progress Notes (Signed)
 NEUROLOGY CONSULT FOLLOW UP NOTE   Date of service: April 10, 2024 Patient Name: Jason Wolf MRN:  [id number] DOB:  [birth year]  Interval Hx/subjective   MRI of the brain negative for any acute abnormalities.  Routine EEG captured some intermittent left-sided twitching which were nonepileptic.  Vitals   Vitals:   04/10/24 1528 04/10/24 1528 04/10/24 1600 04/10/24 1640  BP: (!) 163/97  (!) 158/92 (!) 150/93  Pulse: 75  76 78  Resp: 17  17 17   Temp:  98.1 F (36.7 C)  98.4 F (36.9 C)  TempSrc:  Oral  Oral  SpO2: 100%  100% 97%  Weight:    104.7 kg  Height:         Body mass index is 32.19 kg/m.  Physical Exam   General: Laying comfortably in bed; in no acute distress.  HENT: Normal oropharynx and mucosa. Normal external appearance of ears and nose.  Neck: Supple, no pain or tenderness  CV: No JVD. No peripheral edema.  Pulmonary: Symmetric Chest rise. Normal respiratory effort.  Abdomen: Soft to touch, non-tender.  Ext: No cyanosis, edema, or deformity  Skin: No rash. Normal palpation of skin.   Musculoskeletal: Normal digits and nails by inspection. No clubbing.   Neurologic Examination  Mental status/Cognition: Asleep but opens eyes to loud voice and able to follow commands, oriented to self, place, month and year, good attention. Speech/language: Fluent, comprehension intact, object naming intact, repetition intact.  Cranial nerves:   CN II Pupils equal and reactive to light, no VF deficits    CN III,IV,VI EOM intact, no gaze preference or deviation, no nystagmus    CN V normal sensation in V1, V2, and V3 segments bilaterally    CN VII no asymmetry, no nasolabial fold flattening    CN VIII normal hearing to speech    CN IX & X normal palatal elevation, no uvular deviation    CN XI 5/5 head turn and 5/5 shoulder shrug bilaterally    CN XII midline tongue protrusion    Motor:  Muscle bulk: Normal, tone normal, pronator drift none tremor none Mvmt Root Nerve   Muscle Right Left Comments  SA C5/6 Ax Deltoid 5 5   EF C5/6 Mc Biceps 5 5   EE C6/7/8 Rad Triceps 5 5   WF C6/7 Med FCR     WE C7/8 PIN ECU     F Ab C8/T1 U ADM/FDI 5 5   HF L1/2/3 Fem Illopsoas 5 5   KE L2/3/4 Fem Quad 5 5   DF L4/5 D Peron Tib Ant 5 5   PF S1/2 Tibial Grc/Sol 5 5    Sensation:  Light touch Intact throughout   Pin prick    Temperature    Vibration   Proprioception    Coordination/Complex Motor:  - Finger to Nose intact bilaterally - Heel to shin bilaterally - Rapid alternating movement normal - Gait: Furred for patient safety.  Medications  Current Facility-Administered Medications:    acetaminophen  (TYLENOL ) tablet 650 mg, 650 mg, Oral, Q6H PRN **OR** acetaminophen  (TYLENOL ) suppository 650 mg, 650 mg, Rectal, Q6H PRN, Alfornia Madison, MD   dextrose  5 % solution, , Intravenous, Continuous, Dino Antu, MD, Last Rate: 50 mL/hr at 04/10/24 1657, Infusion Verify at 04/10/24 1657   enoxaparin  (LOVENOX ) injection 40 mg, 40 mg, Subcutaneous, Q24H, Rathore, Vasundhra, MD, 40 mg at 04/10/24 1537   hydrOXYzine  (ATARAX ) tablet 25 mg, 25 mg, Oral, BID PRN, Rashid, Farhan, MD   losartan  (COZAAR )  tablet 50 mg, 50 mg, Oral, Daily, Alfornia Madison, MD, 50 mg at 04/10/24 1056   Oral care mouth rinse, 15 mL, Mouth Rinse, PRN, Dino Antu, MD   sertraline  (ZOLOFT ) tablet 100 mg, 100 mg, Oral, Daily, Rathore, Vasundhra, MD, 100 mg at 04/10/24 1056   traZODone  (DESYREL ) tablet 50 mg, 50 mg, Oral, QHS, Rathore, Vasundhra, MD  Labs and Diagnostic Imaging   CBC:  Recent Labs  Lab 04/09/24 2339 04/10/24 0125  WBC  --  8.0  NEUTROABS  --  5.1  HGB 13.6 14.1  HCT 40.0 40.0  MCV  --  86.0  PLT  --  313    Basic Metabolic Panel:  Lab Results  Component Value Date   NA 142 04/10/2024   K 3.5 04/10/2024   CO2 24 04/10/2024   GLUCOSE 57 (L) 04/10/2024   BUN 7 04/10/2024   CREATININE 0.78 04/10/2024   CALCIUM  8.9 04/10/2024   GFRNONAA >60 04/10/2024    GFRAA 137 06/29/2020   Lipid Panel:  Lab Results  Component Value Date   LDLCALC UNABLE TO CALCULATE IF TRIGLYCERIDE OVER 400 mg/dL 90/83/7977   YhaJ8r:  Lab Results  Component Value Date   HGBA1C 7.0 (H) 04/10/2024   Urine Drug Screen:     Component Value Date/Time   LABOPIA NONE DETECTED 04/10/2024 0139   COCAINSCRNUR NONE DETECTED 04/10/2024 0139   LABBENZ NONE DETECTED 04/10/2024 0139   AMPHETMU NONE DETECTED 04/10/2024 0139   THCU NONE DETECTED 04/10/2024 0139   LABBARB NONE DETECTED 04/10/2024 0139    Alcohol Level     Component Value Date/Time   [facility] <15 04/09/2024 2337   INR  Lab Results  Component Value Date   INR 1.1 04/10/2024   APTT  Lab Results  Component Value Date   APTT 28 04/10/2024   AED levels: No results found for: PHENYTOIN, ZONISAMIDE, LAMOTRIGINE, LEVETIRACETA  CT Head without contrast(Personally reviewed): CTH was negative for a large hypodensity concerning for a large territory infarct or hyperdensity concerning for an ICH  CT angio Head and Neck with contrast(Personally reviewed): No LVO  MRI Brain(Personally reviewed): No acute abnormality, specifically no stroke.  rEEG:  This study is within normal limits. No seizures or epileptiform discharges were seen throughout the recording.   Patient was noted to have episodes of brief arm and leg jerking intermittently during the study without concomitant EEG change. These events were most likely NON epileptic.  Assessment   43 y.o. male with a PMHx of bipolar disorder, DM, EtOH abuse (states he is now sober), former drug abuse (states he is no longer taking illicit drugs), hepatitis C and HTN who presents via EMS to the ED from his residence after a syncopal episode followed by confusion. LKN was 10:15 PM. He fainted at 10:30 PM, was unconscious for about 30 seconds and on awakening was confused with slurred speech, per roommates. On EMS arrival CBG was 511. EMS noted some left sided  weakness, confusion, dysarthria and intermittent whole-body jerking movements.  Workup so far with MRI of the brain negative for an acute infarct.  Routine EEG negative for seizures.  Some of his intermittent left sided jerking were captured and were nonepileptic.  It is very possible the focal seizure could have been triggered by hyperglycemia.  However the episode captured on routine EEG were nonepileptic.  Recommendations  -Overall low suspicion for seizures or acute infarct. -Neurology will sign off.  Feel free to contact us  with any questions or concerns. ______________________________________________________________________  Signed, Berlene Dixson, MD [facility]

## 2024-04-10 NOTE — ED Notes (Signed)
 Per Dr. Dino, hold D5 infusion unless needed again.

## 2024-04-10 NOTE — Progress Notes (Signed)
  Progress Note   Patient: Jason Wolf FMW:[id number] DOB: [birth year] DOA: 04/09/2024     0 DOS: the patient was seen and examined on 04/10/2024   Same day admission Note:  43 y.o. male with medical history significant of insulin -dependent type 2 diabetes, hypertension, previous IV drug abuse, alcohol use disorder in remission, tobacco abuse, bipolar disorder, chronic hepatitis C presented to ED via EMS from his residence after syncopal episode. He was found to have hypoglycemia. Brain imaging showed no evidence of stroke. Off of dextrose  infusion. He is on semglee  54 units at bedtime and 6 units in am at home. He is on sliding scale novolog . He is on 7.5 mg of Monjauro and has lost 125 lbs. He is being closely monitored for any further hypoglycemia episodes. He lives at [facility] with his roommates.  Author: Deliliah Room, MD 04/10/2024 12:17 PM  For on call review [email or web address].

## 2024-04-10 NOTE — ED Provider Notes (Signed)
 Jason Wolf EMERGENCY DEPARTMENT AT [facility] Provider Note   CSN: [id number] Arrival date & time: 04/09/24  2329  An emergency department physician performed an initial assessment on this suspected stroke patient at 2329.  Patient presents with: Code Stroke   Jason Wolf is a 43 y.o. male.   The patient presents to the emergency department from [facility] dormitory.  Patient reportedly had a syncopal episode and then roommates noted that he was confused afterwards.  They did not notice any seizure activity.  Comes to the emergency department as a code stroke.  EMS reports that the patient has been very confused, occasional nonsense response with left-sided weakness.  EMS reports that his blood sugar was over 500 when they picked him up.  He does have a history of diabetes.  Patient reportedly took an unknown dose of long-acting insulin  prior to arrival of EMS.       Prior to Admission medications   Medication Sig Start Date End Date Taking? Authorizing Provider  Calcium  Carbonate Antacid (TUMS CHEWY DELIGHTS PO) Take 1 tablet by mouth as needed (heartburn).    [provider]  Continuous Blood Gluc Sensor (FREESTYLE LIBRE 3 SENSOR) MISC Change sensor every 14 days. 05/18/22     Glecaprevir -Pibrentasvir  (MAVYRET ) 100-40 MG TABS Take 3 tablets by mouth daily with breakfast. 01/10/24   Kuppelweiser, Cassie L, RPH-CPP  glucose monitoring kit (FREESTYLE) monitoring kit 1 each by Does not apply route 4 (four) times daily - after meals and at bedtime. 1 month Diabetic Testing Supplies for QAC-QHS accuchecks. 02/23/20   Ghimire, Donalda HERO, MD  hydrOXYzine  (VISTARIL ) 25 MG capsule Take 25 mg by mouth 2 (two) times daily as needed for anxiety.    [provider]  insulin  aspart (NOVOLOG  FLEXPEN) 100 UNIT/ML FlexPen Inject 15-20 Units into the skin three times daily and correctional scale as directed. 05/18/22     insulin  glargine (SEMGLEE ) 100 UNIT/ML injection Inject 50  Units into the skin at bedtime.    [provider]  Insulin  Pen Needle (PEN NEEDLES) 32G X 4 MM MISC Use as instructed. Inject into the skin 5 times per day 06/29/20   Fleming, Zelda W, NP  losartan  (COZAAR ) 25 MG tablet Take 25 mg by mouth daily.    [provider]  melatonin 5 MG TABS Take 5-10 mg by mouth at bedtime.    [provider]  naproxen  (NAPROSYN ) 500 MG tablet Take 1 tablet (500 mg total) by mouth 2 (two) times daily as needed for moderate pain (pain score 4-6). 05/14/23   Rancour, Garnette, MD  SEMGLEE , YFGN, 100 UNIT/ML Pen Inject into the skin. 04/04/23   [provider]  tirzepatide  (MOUNJARO ) 2.5 MG/0.5ML Pen Inject 2.5 mg into the skin once a week. 05/18/22     traZODone  (DESYREL ) 50 MG tablet Take 50 mg by mouth at bedtime. 04/12/23   [provider]    Allergies: Bee venom, Wasp venom, Yellow jacket venom, and Codeine    Review of Systems  Updated Vital Signs BP (!) 147/84   Pulse 84   Temp 98.5 F (36.9 C) (Oral)   Resp 18   Ht 5' 11 (1.803 m)   Wt 106 kg   SpO2 100%   BMI 32.59 kg/m   Physical Exam Vitals and nursing note reviewed.  Constitutional:      General: He is in acute distress.     Appearance: He is diaphoretic.  HENT:     Head: Atraumatic.  Cardiovascular:     Rate and Rhythm: Normal rate and regular rhythm.  Pulmonary:     Effort: Pulmonary effort is normal.     Breath sounds: Normal breath sounds.  Musculoskeletal:        General: No swelling or deformity.     Cervical back: Neck supple.  Skin:    Findings: No rash.  Neurological:     Mental Status: He is lethargic.     GCS: GCS eye subscore is 3. GCS verbal subscore is 3. GCS motor subscore is 6.     Cranial Nerves: Cranial nerves 2-12 are intact.     Motor: Weakness (left arm/leg drift) present.     (all labs ordered are listed, but only abnormal results are displayed) Labs Reviewed  COMPREHENSIVE METABOLIC PANEL WITH GFR - Abnormal;  Notable for the following components:      Result Value   Potassium 3.4 (*)    Glucose, Bld 57 (*)    All other components within normal limits  AMMONIA - Abnormal; Notable for the following components:   Ammonia 68 (*)    All other components within normal limits  URINALYSIS, ROUTINE W REFLEX MICROSCOPIC - Abnormal; Notable for the following components:   APPearance HAZY (*)    Specific Gravity, Urine 1.035 (*)    Glucose, UA 150 (*)    All other components within normal limits  I-STAT CHEM 8, ED - Abnormal; Notable for the following components:   Potassium 3.3 (*)    Glucose, Bld 117 (*)    Calcium , Ion 1.03 (*)    All other components within normal limits  CBG MONITORING, ED - Abnormal; Notable for the following components:   Glucose-Capillary 133 (*)    All other components within normal limits  CBG MONITORING, ED - Abnormal; Notable for the following components:   Glucose-Capillary 100 (*)    All other components within normal limits  CBG MONITORING, ED - Abnormal; Notable for the following components:   Glucose-Capillary 63 (*)    All other components within normal limits  ETHANOL  CBC  DIFFERENTIAL  RAPID URINE DRUG SCREEN, HOSP PERFORMED  BETA-HYDROXYBUTYRIC ACID  PROTIME-INR  APTT  I-STAT CG4 LACTIC ACID, ED  CBG MONITORING, ED  CBG MONITORING, ED    EKG: EKG Interpretation Date/Time:  Friday April 10 2024 00:27:54 EDT Ventricular Rate:  91 PR Interval:  156 QRS Duration:  113 QT Interval:  438 QTC Calculation: 539 R Axis:   48  Text Interpretation: Sinus rhythm Probable left atrial enlargement Incomplete right bundle branch block Prolonged QT interval No significant change since last tracing Confirmed by Haze Lonni PARAS [phone]) on 04/10/2024 3:27:05 AM  Radiology: CT HEAD CODE STROKE WO CONTRAST Result Date: 04/10/2024 CLINICAL DATA:  Code stroke. Initial evaluation for acute neuro deficit, stroke. EXAM: CT HEAD WITHOUT CONTRAST CT ANGIOGRAPHY  HEAD AND NECK TECHNIQUE: Multidetector CT imaging of the head and neck was performed using the standard protocol during bolus administration of intravenous contrast. Multiplanar CT image reconstructions and MIPs were obtained to evaluate the vascular anatomy. Carotid stenosis measurements (when applicable) are obtained utilizing NASCET criteria, using the distal internal carotid diameter as the denominator. RADIATION DOSE REDUCTION: This exam was performed according to the departmental dose-optimization program which includes automated exposure control, adjustment of the mA and/or kV according to patient size and/or use of iterative reconstruction technique. CONTRAST:  Please see contrast documentation. COMPARISON:  Prior study from 04/13/2021. FINDINGS: CT HEAD FINDINGS Brain: Cerebral volume within  normal limits for patient age. No acute intracranial hemorrhage. No acute large vessel territory infarct. No mass lesion, midline shift, or mass effect. Ventricles are normal in size without hydrocephalus. No extra-axial fluid collection. Vascular: No abnormal hyperdense vessel. Skull: Scalp soft tissues demonstrate no acute abnormality. Calvarium intact. Sinuses/Orbits: Globes and orbital soft tissues within normal limits. Mild scattered mucosal thickening about the ethmoidal air cells. Paranasal sinuses are otherwise clear. No mastoid effusion. ASPECTS [facility] Stroke Program Early CT Score) - Ganglionic level infarction (caudate, lentiform nuclei, internal capsule, insula, M1-M3 cortex): 7 - Supraganglionic infarction (M4-M6 cortex): 3 Total score (0-10 with 10 being normal): 10 CTA NECK FINDINGS Aortic arch: Standard branching. Imaged portion shows no evidence of aneurysm or dissection. No significant stenosis of the major arch vessel origins. Right carotid system: Right common and internal carotid arteries are patent without visible dissection. Mild atheromatous change about the right carotid bulb without stenosis.  Left carotid system: Left common and internal carotid arteries are patent without visible dissection. Atheromatous change about the left carotid bulb with associated stenosis of up to 50% by NASCET criteria (series 5, image 195). Vertebral arteries: Both vertebral arteries arise from subclavian arteries. Right vertebral artery dominant. Vertebral arteries patent without stenosis or dissection. Skeleton: No worrisome osseous lesions.  Patient is edentulous. Other neck: No other acute finding. Upper chest: No other acute finding. Review of the MIP images confirms the above findings CTA HEAD FINDINGS Anterior circulation: Both internal carotid arteries widely patent to the termini without stenosis. A1 segments widely patent. Normal anterior communicating artery complex. Both anterior cerebral arteries widely patent to their distal aspects without stenosis. No M1 stenosis or occlusion. Normal MCA bifurcations. Distal MCA branches well perfused and symmetric. Posterior circulation: Both V4 segments patent without stenosis. Right PICA patent. Left PICA not seen. Basilar patent without stenosis. Superior cerebral arteries patent bilaterally. Left PCA supplied via the basilar. Fetal type origin right PCA. Both PCAs patent without significant stenosis. Venous sinuses: Grossly patent allowing for timing the contrast bolus. Anatomic variants: As above.  No aneurysm. Review of the MIP images confirms the above findings IMPRESSION: CT HEAD: 1. Negative head CT.  No acute intracranial abnormality. 2. Aspects = 10. CTA HEAD AND NECK: 1. Negative CTA for large vessel occlusion or other emergent finding. 2. 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 3. Additional mild atheromatous change about the right carotid bulb without stenosis. These results were communicated to Dr. Lindzen at 12:15 am and again at 12:18 a.m. on 04/10/2024 by text page via the [facility] messaging  system. Electronically Signed   By: Morene Hoard M.D.   On: 04/10/2024 00:32   CT ANGIO HEAD NECK W WO CM (CODE STROKE) Result Date: 04/10/2024 CLINICAL DATA:  Code stroke. Initial evaluation for acute neuro deficit, stroke. EXAM: CT HEAD WITHOUT CONTRAST CT ANGIOGRAPHY HEAD AND NECK TECHNIQUE: Multidetector CT imaging of the head and neck was performed using the standard protocol during bolus administration of intravenous contrast. Multiplanar CT image reconstructions and MIPs were obtained to evaluate the vascular anatomy. Carotid stenosis measurements (when applicable) are obtained utilizing NASCET criteria, using the distal internal carotid diameter as the denominator. RADIATION DOSE REDUCTION: This exam was performed according to the departmental dose-optimization program which includes automated exposure control, adjustment of the mA and/or kV according to patient size and/or use of iterative reconstruction technique. CONTRAST:  Please see contrast documentation. COMPARISON:  Prior study from 04/13/2021. FINDINGS:  CT HEAD FINDINGS Brain: Cerebral volume within normal limits for patient age. No acute intracranial hemorrhage. No acute large vessel territory infarct. No mass lesion, midline shift, or mass effect. Ventricles are normal in size without hydrocephalus. No extra-axial fluid collection. Vascular: No abnormal hyperdense vessel. Skull: Scalp soft tissues demonstrate no acute abnormality. Calvarium intact. Sinuses/Orbits: Globes and orbital soft tissues within normal limits. Mild scattered mucosal thickening about the ethmoidal air cells. Paranasal sinuses are otherwise clear. No mastoid effusion. ASPECTS [facility] Stroke Program Early CT Score) - Ganglionic level infarction (caudate, lentiform nuclei, internal capsule, insula, M1-M3 cortex): 7 - Supraganglionic infarction (M4-M6 cortex): 3 Total score (0-10 with 10 being normal): 10 CTA NECK FINDINGS Aortic arch: Standard branching. Imaged  portion shows no evidence of aneurysm or dissection. No significant stenosis of the major arch vessel origins. Right carotid system: Right common and internal carotid arteries are patent without visible dissection. Mild atheromatous change about the right carotid bulb without stenosis. Left carotid system: Left common and internal carotid arteries are patent without visible dissection. Atheromatous change about the left carotid bulb with associated stenosis of up to 50% by NASCET criteria (series 5, image 195). Vertebral arteries: Both vertebral arteries arise from subclavian arteries. Right vertebral artery dominant. Vertebral arteries patent without stenosis or dissection. Skeleton: No worrisome osseous lesions.  Patient is edentulous. Other neck: No other acute finding. Upper chest: No other acute finding. Review of the MIP images confirms the above findings CTA HEAD FINDINGS Anterior circulation: Both internal carotid arteries widely patent to the termini without stenosis. A1 segments widely patent. Normal anterior communicating artery complex. Both anterior cerebral arteries widely patent to their distal aspects without stenosis. No M1 stenosis or occlusion. Normal MCA bifurcations. Distal MCA branches well perfused and symmetric. Posterior circulation: Both V4 segments patent without stenosis. Right PICA patent. Left PICA not seen. Basilar patent without stenosis. Superior cerebral arteries patent bilaterally. Left PCA supplied via the basilar. Fetal type origin right PCA. Both PCAs patent without significant stenosis. Venous sinuses: Grossly patent allowing for timing the contrast bolus. Anatomic variants: As above.  No aneurysm. Review of the MIP images confirms the above findings IMPRESSION: CT HEAD: 1. Negative head CT.  No acute intracranial abnormality. 2. Aspects = 10. CTA HEAD AND NECK: 1. Negative CTA for large vessel occlusion or other emergent finding. 2. Atheromatous change about the left carotid  bulb with associated stenosis of up to 50% by NASCET criteria. 3. Additional mild atheromatous change about the right carotid bulb without stenosis. These results were communicated to Dr. Lindzen at 12:15 am and again at 12:18 a.m. on 04/10/2024 by text page via the [facility] messaging system. Electronically Signed   By: Morene Hoard M.D.   On: 04/10/2024 00:32     Procedures   Angiocath insertion Performed by: Lonni JINNY Seats  Consent: Verbal consent obtained. Risks and benefits: risks, benefits and alternatives were discussed Time out: Immediately prior to procedure a time out was called to verify the correct patient, procedure, equipment, support staff and site/side marked as required.  Preparation: Patient was prepped and draped in the usual sterile fashion.  Vein Location: r upper arm  Ultrasound Guided  Gauge: 20  Normal blood return and flush without difficulty Patient tolerance: Patient tolerated the procedure well with no immediate complications.    Medications Ordered in the ED  iohexol  (OMNIPAQUE ) 350 MG/ML injection 75 mL (75 mLs Intravenous Contrast Given 04/10/24 0020)  dextrose  50 % solution 25 mL (25 mLs Intravenous  Given 04/10/24 0125)  dextrose  5 % solution ( Intravenous New Bag/Given 04/10/24 0127)                                    Medical Decision Making Amount and/or Complexity of Data Reviewed Independent Historian: EMS External Data Reviewed: labs, radiology, ECG and notes. Labs: ordered. Decision-making details documented in ED Course. Radiology: ordered and independent interpretation performed. Decision-making details documented in ED Course. ECG/medicine tests: ordered and independent interpretation performed. Decision-making details documented in ED Course.  Risk Prescription drug management. Decision regarding hospitalization.   Differential diagnosis considered includes, but not limited to: TIA; Stroke; ICH; Seizure; electrolyte  abnormality; hypoglycemia; toxic/pharmacologic causes; CNS infection; psychiatric disorder  Presents to the emergency department for altered mental status and possible stroke.  Patient's roommates called after the patient had a syncopal episode.  EMS noted hyperglycemia confusion and left-sided weakness.  A code stroke was initiated.  At arrival, patient having difficulty staying awake, not able to answer questions.  Intermittent coherence and then garbled responses.  He does seem to have left-sided pronator drift.  Patient evaluated by Dr. Lindzen, neurology at arrival as well as myself.  CT head, CT angio head and neck performed.  No acute abnormality noted.  Dr. Merrianne felt presentation was more consistent with a metabolic abnormality, not consistent with acute stroke.  Patient's blood sugar was found to be 117 at arrival.  He was reportedly over 500 initially for EMS.  Patient was able to indicate that he took insulin  this evening.  He is a known diabetic.  Blood pressure continued to drop here in the emergency department.  He was ultimately down into the 60s and was given D50 followed by D5 drip.  After administration of glucose, patient became awake and alert.  He reports that his blood sugars have been running high lately.  He has been trying to manage them but they have stayed high.  I suspect his presentation was secondary to rapid drop in his blood sugar.  He is now nonfocal not his sugars are improved.  He is still staying on the low side on a D5 drip, will admit to hospital service for further management.  CRITICAL CARE Performed by: Lonni JINNY Seats   Total critical care time: 35 minutes  Critical care time was exclusive of separately billable procedures and treating other patients.  Critical care was necessary to treat or prevent imminent or life-threatening deterioration.  Critical care was time spent personally by me on the following activities: development of treatment  plan with patient and/or surrogate as well as nursing, discussions with consultants, evaluation of patient's response to treatment, examination of patient, obtaining history from patient or surrogate, ordering and performing treatments and interventions, ordering and review of laboratory studies, ordering and review of radiographic studies, pulse oximetry and re-evaluation of patient's condition.      Final diagnoses:  Altered mental status, unspecified altered mental status type  Hypoglycemia    ED Discharge Orders     None          Grete Bosko, Lonni JINNY, MD 04/10/24 [phone]

## 2024-04-10 NOTE — ED Notes (Signed)
 Patient switched to hospital bed for comfort.

## 2024-04-10 NOTE — Progress Notes (Signed)
 Routine EEG completed, results pending Neurology review and interpretation

## 2024-04-10 NOTE — Procedures (Addendum)
 Patient Name: Jason Wolf  MRN: [id number]  Epilepsy Attending: Arlin MALVA Krebs  Referring Physician/Provider: Merrianne Locus, MD  Date: 04/10/2024 Duration: 26.02 mins  Patient history: 43yo M with ams. EEG to evaluate for seizure  Level of alertness: Awake  AEDs during EEG study: None  Technical aspects: This EEG study was done with scalp electrodes positioned according to the 10-20 International system of electrode placement. Electrical activity was reviewed with band pass filter of 1-70Hz , sensitivity of 7 uV/mm, display speed of 74mm/sec with a 60Hz  notched filter applied as appropriate. EEG data were recorded continuously and digitally stored.  Video monitoring was available and reviewed as appropriate.  Description: The posterior dominant rhythm consists of 9 Hz activity of moderate voltage (25-35 uV) seen predominantly in posterior head regions, symmetric and reactive to eye opening and eye closing.   Patient was noted to have episodes of brief arm and leg jerking intermittently during the study. Concomitant EEG before, during and after the event did not show any EEG changes suggest seizure.  Hyperventilation and photic stimulation were not performed.     IMPRESSION: This study is within normal limits. No seizures or epileptiform discharges were seen throughout the recording.  Patient was noted to have episodes of brief arm and leg jerking intermittently during the study without concomitant EEG change. These events were most likely NON epileptic.    Waunita Sandstrom O Shakevia Sarris

## 2024-04-10 NOTE — ED Notes (Signed)
 2C notified of patient coming up

## 2024-04-10 NOTE — Code Documentation (Signed)
 Responded to Code Stroke called at 2315 for L sided weakness and slurred speech, [id number]. Pt arrived at 2329, CBG-133, NIH-6, CT head negative for acute changes, CTA-no LVO. TNK not given-low suspicion for stroke. Pt remains in TNK window until 0245. Please complete VS/neuro checks(including NIH) q27min until 0245, then q2h x 12h, then q4h.

## 2024-04-10 NOTE — ED Notes (Signed)
 Pt eating breakfast

## 2024-04-11 DIAGNOSIS — E162 Hypoglycemia, unspecified: Secondary | ICD-10-CM | POA: Diagnosis not present

## 2024-04-11 LAB — GLUCOSE, CAPILLARY
Glucose-Capillary: 193 mg/dL — ABNORMAL HIGH (ref 70–99)
Glucose-Capillary: 204 mg/dL — ABNORMAL HIGH (ref 70–99)
Glucose-Capillary: 223 mg/dL — ABNORMAL HIGH (ref 70–99)

## 2024-04-11 NOTE — Discharge Summary (Signed)
 Physician Discharge Summary   Patient: Jason Wolf MRN: [id number] DOB: 05/08/1981  Admit date:     04/09/2024  Discharge date: 04/11/24  Discharge Physician: Deliliah Room   PCP: Almarie Donnice Boas, MD   Recommendations at discharge:    Follow up with your PCP on Monday, 9/15. Hold all forms of insulin  until you see your PCP/endocrinologist Keep checking glucose levels with the help of Dexcom.  Discharge Diagnoses: Principal Problem:   Hypoglycemia Active Problems:   Insulin  dependent type 2 diabetes mellitus (HCC)   Syncope   Acute metabolic encephalopathy   Hypokalemia   Hospital Course:  43 y.o. male with medical history significant of insulin -dependent type 2 diabetes, hypertension, previous IV drug abuse, alcohol use disorder in remission, tobacco abuse, bipolar disorder, chronic hepatitis C presented to ED via EMS from his residence after syncopal episode. He was found to have hypoglycemia. Brain imaging showed no evidence of stroke. Off of dextrose  infusion. He is on semglee  54 units at bedtime and 6 units in am at home along with sliding scale novolog . He is on 7.5 mg of Monjauro and has lost 125 lbs. He was monitored for any further hypoglycemia episodes. Neurologist was consulted. EEG didn't show any epileptiform activities. He was alert, awake and oriented x 3 at the time of the discharge. All forms of insulin  (SSI and long acting) were held on discharge and he will follow up with his PCP/Endocrinologist and they will decide what to do next based on his blood glucose levels trend. He lives at [facility] with his roommates.      Consultants: Neurology Procedures performed: EEG  Disposition: Home Diet recommendation:  Regular diet DISCHARGE MEDICATION: Allergies as of 04/11/2024       Reactions   Bee Venom Anaphylaxis   Wasp Venom Anaphylaxis   Yellow Jacket Venom Anaphylaxis   Codeine Nausea And Vomiting, Other (See Comments)   headaches         Medication List     STOP taking these medications    NovoLOG  FlexPen 100 UNIT/ML FlexPen Generic drug: insulin  aspart   Semglee  100 UNIT/ML injection Generic drug: insulin  glargine       TAKE these medications    FreeStyle Libre 3 Sensor Misc Change sensor every 14 days.   glucose monitoring kit monitoring kit 1 each by Does not apply route 4 (four) times daily - after meals and at bedtime. 1 month Diabetic Testing Supplies for QAC-QHS accuchecks.   hydrOXYzine  25 MG capsule Commonly known as: VISTARIL  Take 25 mg by mouth 2 (two) times daily as needed for anxiety.   losartan  50 MG tablet Commonly known as: COZAAR  Take 50 mg by mouth daily.   Mavyret  100-40 MG Tabs Generic drug: Glecaprevir -Pibrentasvir  Take 3 tablets by mouth daily with breakfast.   meloxicam 15 MG tablet Commonly known as: MOBIC Take 15 mg by mouth daily.   Mounjaro  7.5 MG/0.5ML Pen Generic drug: tirzepatide  Inject 7.5 mg into the skin every Friday. What changed: Another medication with the same name was removed. Continue taking this medication, and follow the directions you see here.   Pen Needles 32G X 4 MM Misc Use as instructed. Inject into the skin 5 times per day   sertraline  100 MG tablet Commonly known as: ZOLOFT  Take 100 mg by mouth daily.   traZODone  50 MG tablet Commonly known as: DESYREL  Take 50 mg by mouth at bedtime.   TUMS CHEWY DELIGHTS PO Take 1 tablet by mouth as needed (heartburn).  Follow-up Information     Almarie Donnice Boas, MD. Schedule an appointment as soon as possible for a visit in 1 week(s).   Specialty: Family Medicine Contact information: [address] [place] [address] [phone]                Discharge Exam: Filed Weights   04/09/24 2300 04/10/24 1640  Weight: 106 kg 104.7 kg   Constitutional: NAD, calm, comfortable Eyes: PERRL, lids and conjunctivae normal ENMT: Mucous membranes are moist. Posterior pharynx clear of any  exudate or lesions.Normal dentition.  Neck: normal, supple, no masses, no thyromegaly Respiratory: clear to auscultation bilaterally, no wheezing, no crackles. Normal respiratory effort. No accessory muscle use.  Cardiovascular: Regular rate and rhythm, no murmurs / rubs / gallops. No extremity edema. 2+ pedal pulses. No carotid bruits.  Abdomen: no tenderness, no masses palpated. No hepatosplenomegaly. Bowel sounds positive.  Musculoskeletal: no clubbing / cyanosis. No joint deformity upper and lower extremities. Good ROM, no contractures. Normal muscle tone.  Skin: no rashes, lesions, ulcers. No induration Neurologic: CN 2-12 grossly intact. Sensation intact, DTR normal. Strength 5/5 x all 4 extremities.  Psychiatric: Normal judgment and insight. Alert and oriented x 3. Normal mood.    Condition at discharge: good  The results of significant diagnostics from this hospitalization (including imaging, microbiology, ancillary and laboratory) are listed below for reference.   Imaging Studies: EEG adult Result Date: 04/10/2024 Shelton Arlin KIDD, MD     04/10/2024  3:10 PM Patient Name: Jason Wolf MRN: [id number] Epilepsy Attending: Arlin KIDD Shelton Referring Physician/Provider: Merrianne Locus, MD Date: 04/10/2024 Duration: 26.02 mins Patient history: 43yo M with ams. EEG to evaluate for seizure Level of alertness: Awake AEDs during EEG study: None Technical aspects: This EEG study was done with scalp electrodes positioned according to the 10-20 International system of electrode placement. Electrical activity was reviewed with band pass filter of 1-70Hz , sensitivity of 7 uV/mm, display speed of 107mm/sec with a 60Hz  notched filter applied as appropriate. EEG data were recorded continuously and digitally stored.  Video monitoring was available and reviewed as appropriate. Description: The posterior dominant rhythm consists of 9 Hz activity of moderate voltage (25-35 uV) seen predominantly in posterior head  regions, symmetric and reactive to eye opening and eye closing. Patient was noted to have episodes of brief arm and leg jerking intermittently during the study. Concomitant EEG before, during and after the event did not show any EEG changes suggest seizure. Hyperventilation and photic stimulation were not performed.   IMPRESSION: This study is within normal limits. No seizures or epileptiform discharges were seen throughout the recording. Patient was noted to have episodes of brief arm and leg jerking intermittently during the study without concomitant EEG change. These events were most likely NON epileptic. Arlin KIDD Shelton   MR BRAIN WO CONTRAST Result Date: 04/10/2024 CLINICAL DATA:  Provided history: Left-sided weakness. EXAM: MRI HEAD WITHOUT CONTRAST MRA HEAD WITHOUT CONTRAST TECHNIQUE: Multiplanar, multi-echo pulse sequences of the brain and surrounding structures were acquired without intravenous contrast. Angiographic images of the Circle of Willis were acquired using MRA technique without intravenous contrast. COMPARISON:  Non-contrast head CT and CT angiogram head/neck performed earlier today 04/10/2024. MRI brain and MRA head 04/13/2021. FINDINGS: MRI HEAD FINDINGS Intermittently motion degraded examination (with up to moderate motion degradation of the acquired sequences). Within this limitation, findings are as follows. Brain: No age-advanced or lobar predominant cerebral atrophy. Mild multifocal T2 FLAIR hyperintense signal abnormality within the pons, nonspecific but compatible  with chronic small vessel ischemic disease. No cortical encephalomalacia is identified. There is no acute infarct. No evidence of an intracranial mass. No chronic intracranial blood products. No extra-axial fluid collection. No midline shift. Vascular: Maintained flow voids within the proximal large arterial vessels. Skull and upper cervical spine: No focal worrisome marrow lesion. Sinuses/Orbits: No mass or acute finding  within the imaged orbits. MRA HEAD FINDINGS Motion degraded examination. Within this limitation findings are as follows. Anterior circulation: The intracranial internal carotid arteries are patent. The M1 middle cerebral arteries are patent. No M2 proximal branch occlusion is identified. The anterior cerebral arteries are patent. The no aneurysm is identified. Posterior circulation: The intracranial vertebral arteries are patent. The basilar artery is patent. The posterior cerebral arteries are patent. The right PCA is fetal in origin. The left posterior communicating artery is diminutive or absent. Anatomic variants: As described. IMPRESSION: MRI brain: 1. Intermittently motion degraded examination. 2. No evidence of an acute intracranial abnormality. The diffusion-weighted imaging is of good quality and there is no evidence of an acute infarct. 3. Mild chronic small vessel ischemic changes within the pons, similar to the prior MRI of 04/13/2021. MRA head: 1. Motion degraded examination. 2. No proximal intracranial large vessel occlusion identified. Electronically Signed   By: Rockey Childs D.O.   On: 04/10/2024 11:09   MR ANGIO HEAD WO CONTRAST Result Date: 04/10/2024 CLINICAL DATA:  Provided history: Left-sided weakness. EXAM: MRI HEAD WITHOUT CONTRAST MRA HEAD WITHOUT CONTRAST TECHNIQUE: Multiplanar, multi-echo pulse sequences of the brain and surrounding structures were acquired without intravenous contrast. Angiographic images of the Circle of Willis were acquired using MRA technique without intravenous contrast. COMPARISON:  Non-contrast head CT and CT angiogram head/neck performed earlier today 04/10/2024. MRI brain and MRA head 04/13/2021. FINDINGS: MRI HEAD FINDINGS Intermittently motion degraded examination (with up to moderate motion degradation of the acquired sequences). Within this limitation, findings are as follows. Brain: No age-advanced or lobar predominant cerebral atrophy. Mild multifocal T2  FLAIR hyperintense signal abnormality within the pons, nonspecific but compatible with chronic small vessel ischemic disease. No cortical encephalomalacia is identified. There is no acute infarct. No evidence of an intracranial mass. No chronic intracranial blood products. No extra-axial fluid collection. No midline shift. Vascular: Maintained flow voids within the proximal large arterial vessels. Skull and upper cervical spine: No focal worrisome marrow lesion. Sinuses/Orbits: No mass or acute finding within the imaged orbits. MRA HEAD FINDINGS Motion degraded examination. Within this limitation findings are as follows. Anterior circulation: The intracranial internal carotid arteries are patent. The M1 middle cerebral arteries are patent. No M2 proximal branch occlusion is identified. The anterior cerebral arteries are patent. The no aneurysm is identified. Posterior circulation: The intracranial vertebral arteries are patent. The basilar artery is patent. The posterior cerebral arteries are patent. The right PCA is fetal in origin. The left posterior communicating artery is diminutive or absent. Anatomic variants: As described. IMPRESSION: MRI brain: 1. Intermittently motion degraded examination. 2. No evidence of an acute intracranial abnormality. The diffusion-weighted imaging is of good quality and there is no evidence of an acute infarct. 3. Mild chronic small vessel ischemic changes within the pons, similar to the prior MRI of 04/13/2021. MRA head: 1. Motion degraded examination. 2. No proximal intracranial large vessel occlusion identified. Electronically Signed   By: Rockey Childs D.O.   On: 04/10/2024 11:09   CT HEAD CODE STROKE WO CONTRAST Result Date: 04/10/2024 CLINICAL DATA:  Code stroke. Initial evaluation for acute neuro deficit,  stroke. EXAM: CT HEAD WITHOUT CONTRAST CT ANGIOGRAPHY HEAD AND NECK TECHNIQUE: Multidetector CT imaging of the head and neck was performed using the standard protocol  during bolus administration of intravenous contrast. Multiplanar CT image reconstructions and MIPs were obtained to evaluate the vascular anatomy. Carotid stenosis measurements (when applicable) are obtained utilizing NASCET criteria, using the distal internal carotid diameter as the denominator. RADIATION DOSE REDUCTION: This exam was performed according to the departmental dose-optimization program which includes automated exposure control, adjustment of the mA and/or kV according to patient size and/or use of iterative reconstruction technique. CONTRAST:  Please see contrast documentation. COMPARISON:  Prior study from 04/13/2021. FINDINGS: CT HEAD FINDINGS Brain: Cerebral volume within normal limits for patient age. No acute intracranial hemorrhage. No acute large vessel territory infarct. No mass lesion, midline shift, or mass effect. Ventricles are normal in size without hydrocephalus. No extra-axial fluid collection. Vascular: No abnormal hyperdense vessel. Skull: Scalp soft tissues demonstrate no acute abnormality. Calvarium intact. Sinuses/Orbits: Globes and orbital soft tissues within normal limits. Mild scattered mucosal thickening about the ethmoidal air cells. Paranasal sinuses are otherwise clear. No mastoid effusion. ASPECTS [facility] Stroke Program Early CT Score) - Ganglionic level infarction (caudate, lentiform nuclei, internal capsule, insula, M1-M3 cortex): 7 - Supraganglionic infarction (M4-M6 cortex): 3 Total score (0-10 with 10 being normal): 10 CTA NECK FINDINGS Aortic arch: Standard branching. Imaged portion shows no evidence of aneurysm or dissection. No significant stenosis of the major arch vessel origins. Right carotid system: Right common and internal carotid arteries are patent without visible dissection. Mild atheromatous change about the right carotid bulb without stenosis. Left carotid system: Left common and internal carotid arteries are patent without visible dissection.  Atheromatous change about the left carotid bulb with associated stenosis of up to 50% by NASCET criteria (series 5, image 195). Vertebral arteries: Both vertebral arteries arise from subclavian arteries. Right vertebral artery dominant. Vertebral arteries patent without stenosis or dissection. Skeleton: No worrisome osseous lesions.  Patient is edentulous. Other neck: No other acute finding. Upper chest: No other acute finding. Review of the MIP images confirms the above findings CTA HEAD FINDINGS Anterior circulation: Both internal carotid arteries widely patent to the termini without stenosis. A1 segments widely patent. Normal anterior communicating artery complex. Both anterior cerebral arteries widely patent to their distal aspects without stenosis. No M1 stenosis or occlusion. Normal MCA bifurcations. Distal MCA branches well perfused and symmetric. Posterior circulation: Both V4 segments patent without stenosis. Right PICA patent. Left PICA not seen. Basilar patent without stenosis. Superior cerebral arteries patent bilaterally. Left PCA supplied via the basilar. Fetal type origin right PCA. Both PCAs patent without significant stenosis. Venous sinuses: Grossly patent allowing for timing the contrast bolus. Anatomic variants: As above.  No aneurysm. Review of the MIP images confirms the above findings IMPRESSION: CT HEAD: 1. Negative head CT.  No acute intracranial abnormality. 2. Aspects = 10. CTA HEAD AND NECK: 1. Negative CTA for large vessel occlusion or other emergent finding. 2. 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 3. Additional mild atheromatous change about the right carotid bulb without stenosis. These results were communicated to Dr. Lindzen at 12:15 am and again at 12:18 a.m. on 04/10/2024 by text page via the [facility] messaging system. Electronically Signed   By: Morene Hoard M.D.   On: 04/10/2024 00:32   CT ANGIO HEAD NECK  W WO CM (CODE STROKE) Result Date: 04/10/2024 CLINICAL DATA:  Code stroke. Initial evaluation for acute neuro deficit, stroke. EXAM: CT HEAD WITHOUT CONTRAST CT ANGIOGRAPHY HEAD AND NECK TECHNIQUE: Multidetector CT imaging of the head and neck was performed using the standard protocol during bolus administration of intravenous contrast. Multiplanar CT image reconstructions and MIPs were obtained to evaluate the vascular anatomy. Carotid stenosis measurements (when applicable) are obtained utilizing NASCET criteria, using the distal internal carotid diameter as the denominator. RADIATION DOSE REDUCTION: This exam was performed according to the departmental dose-optimization program which includes automated exposure control, adjustment of the mA and/or kV according to patient size and/or use of iterative reconstruction technique. CONTRAST:  Please see contrast documentation. COMPARISON:  Prior study from 04/13/2021. FINDINGS: CT HEAD FINDINGS Brain: Cerebral volume within normal limits for patient age. No acute intracranial hemorrhage. No acute large vessel territory infarct. No mass lesion, midline shift, or mass effect. Ventricles are normal in size without hydrocephalus. No extra-axial fluid collection. Vascular: No abnormal hyperdense vessel. Skull: Scalp soft tissues demonstrate no acute abnormality. Calvarium intact. Sinuses/Orbits: Globes and orbital soft tissues within normal limits. Mild scattered mucosal thickening about the ethmoidal air cells. Paranasal sinuses are otherwise clear. No mastoid effusion. ASPECTS [facility] Stroke Program Early CT Score) - Ganglionic level infarction (caudate, lentiform nuclei, internal capsule, insula, M1-M3 cortex): 7 - Supraganglionic infarction (M4-M6 cortex): 3 Total score (0-10 with 10 being normal): 10 CTA NECK FINDINGS Aortic arch: Standard branching. Imaged portion shows no evidence of aneurysm or dissection. No significant stenosis of the major arch vessel origins.  Right carotid system: Right common and internal carotid arteries are patent without visible dissection. Mild atheromatous change about the right carotid bulb without stenosis. Left carotid system: Left common and internal carotid arteries are patent without visible dissection. Atheromatous change about the left carotid bulb with associated stenosis of up to 50% by NASCET criteria (series 5, image 195). Vertebral arteries: Both vertebral arteries arise from subclavian arteries. Right vertebral artery dominant. Vertebral arteries patent without stenosis or dissection. Skeleton: No worrisome osseous lesions.  Patient is edentulous. Other neck: No other acute finding. Upper chest: No other acute finding. Review of the MIP images confirms the above findings CTA HEAD FINDINGS Anterior circulation: Both internal carotid arteries widely patent to the termini without stenosis. A1 segments widely patent. Normal anterior communicating artery complex. Both anterior cerebral arteries widely patent to their distal aspects without stenosis. No M1 stenosis or occlusion. Normal MCA bifurcations. Distal MCA branches well perfused and symmetric. Posterior circulation: Both V4 segments patent without stenosis. Right PICA patent. Left PICA not seen. Basilar patent without stenosis. Superior cerebral arteries patent bilaterally. Left PCA supplied via the basilar. Fetal type origin right PCA. Both PCAs patent without significant stenosis. Venous sinuses: Grossly patent allowing for timing the contrast bolus. Anatomic variants: As above.  No aneurysm. Review of the MIP images confirms the above findings IMPRESSION: CT HEAD: 1. Negative head CT.  No acute intracranial abnormality. 2. Aspects = 10. CTA HEAD AND NECK: 1. Negative CTA for large vessel occlusion or other emergent finding. 2. 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 3. Additional mild atheromatous change about the  right carotid bulb without stenosis. These results were communicated to Dr. Lindzen at 12:15 am and again at 12:18 a.m. on 04/10/2024 by text page via the [facility] messaging system. Electronically Signed   By: Morene Hoard M.D.   On: 04/10/2024 00:32    Microbiology: Results for orders placed or performed  during the hospital encounter of 04/09/24  MRSA Next Gen by PCR, Nasal     Status: None   Collection Time: 04/10/24  5:00 PM   Specimen: Nasal Mucosa; Nasal Swab  Result Value Ref Range Status   MRSA by PCR Next Gen NOT DETECTED NOT DETECTED Final    Comment: (NOTE) The GeneXpert MRSA Assay (FDA approved for NASAL specimens only), is one component of a comprehensive MRSA colonization surveillance program. It is not intended to diagnose MRSA infection nor to guide or monitor treatment for MRSA infections. Test performance is not FDA approved in patients less than 61 years old. Performed at [facility], [address], [place], [place] [address]     Labs: CBC: Recent Labs  Lab 04/09/24 2339 04/10/24 0125  WBC  --  8.0  NEUTROABS  --  5.1  HGB 13.6 14.1  HCT 40.0 40.0  MCV  --  86.0  PLT  --  313   Basic Metabolic Panel: Recent Labs  Lab 04/09/24 2339 04/10/24 0125 04/10/24 1125  NA 142 142  --   K 3.3* 3.4* 3.5  CL 108 105  --   CO2  --  24  --   GLUCOSE 117* 57*  --   BUN 7 7  --   CREATININE 0.90 0.78  --   CALCIUM   --  8.9  --   MG  --   --  2.0   Liver Function Tests: Recent Labs  Lab 04/10/24 0125  AST 32  ALT 25  ALKPHOS 52  BILITOT 0.6  PROT 6.9  ALBUMIN 3.9   CBG: Recent Labs  Lab 04/10/24 1525 04/10/24 1957 04/11/24 0018 04/11/24 0422 04/11/24 0820  GLUCAP 102* 187* 204* 193* 223*    Discharge time spent: 42 minutes.  Signed: Deliliah Room, MD [facility] 04/11/2024

## 2024-04-21 DIAGNOSIS — F411 Generalized anxiety disorder: Secondary | ICD-10-CM | POA: Diagnosis not present

## 2024-04-21 DIAGNOSIS — F32A Depression, unspecified: Secondary | ICD-10-CM | POA: Diagnosis not present

## 2024-04-21 DIAGNOSIS — G4700 Insomnia, unspecified: Secondary | ICD-10-CM | POA: Diagnosis not present

## 2024-05-06 ENCOUNTER — Encounter (HOSPITAL_COMMUNITY): Payer: Self-pay | Admitting: Emergency Medicine

## 2024-05-06 ENCOUNTER — Other Ambulatory Visit: Payer: Self-pay

## 2024-05-06 ENCOUNTER — Emergency Department (HOSPITAL_COMMUNITY)
Admission: EM | Admit: 2024-05-06 | Discharge: 2024-05-07 | Disposition: A | Discharge status: Home / Self Care | Attending: Emergency Medicine | Admitting: Emergency Medicine

## 2024-05-06 DIAGNOSIS — Z7984 Long term (current) use of oral hypoglycemic drugs: Secondary | ICD-10-CM | POA: Insufficient documentation

## 2024-05-06 DIAGNOSIS — T483X1A Poisoning by antitussives, accidental (unintentional), initial encounter: Secondary | ICD-10-CM | POA: Insufficient documentation

## 2024-05-06 DIAGNOSIS — I10 Essential (primary) hypertension: Secondary | ICD-10-CM | POA: Diagnosis not present

## 2024-05-06 DIAGNOSIS — T50904A Poisoning by unspecified drugs, medicaments and biological substances, undetermined, initial encounter: Secondary | ICD-10-CM | POA: Diagnosis not present

## 2024-05-06 DIAGNOSIS — Z79899 Other long term (current) drug therapy: Secondary | ICD-10-CM | POA: Diagnosis not present

## 2024-05-06 DIAGNOSIS — T50901A Poisoning by unspecified drugs, medicaments and biological substances, accidental (unintentional), initial encounter: Secondary | ICD-10-CM | POA: Diagnosis not present

## 2024-05-06 DIAGNOSIS — Z794 Long term (current) use of insulin: Secondary | ICD-10-CM | POA: Insufficient documentation

## 2024-05-06 DIAGNOSIS — T887XXA Unspecified adverse effect of drug or medicament, initial encounter: Secondary | ICD-10-CM | POA: Diagnosis not present

## 2024-05-06 DIAGNOSIS — E119 Type 2 diabetes mellitus without complications: Secondary | ICD-10-CM | POA: Insufficient documentation

## 2024-05-06 NOTE — ED Triage Notes (Signed)
 BIB EMS from home.  Pt's roommate called because pt was twitching  Pt reported to EMS he had taken cough medication (pills) the whole box but unsure how many was in the box.  Pt is twitching and periodically falling asleep.  VSS.  BP 158/80 with hx of hypertension.  Pt reports this was not a self-harm attempt but would like resources for stopping using substances.

## 2024-05-07 LAB — COMPREHENSIVE METABOLIC PANEL WITH GFR
ALT: 27 U/L (ref 0–44)
AST: 35 U/L (ref 15–41)
Albumin: 4.1 g/dL (ref 3.5–5.0)
Alkaline Phosphatase: 63 U/L (ref 38–126)
Anion gap: 11 (ref 5–15)
BUN: 10 mg/dL (ref 6–20)
CO2: 23 mmol/L (ref 22–32)
Calcium: 9.1 mg/dL (ref 8.9–10.3)
Chloride: 104 mmol/L (ref 98–111)
Creatinine, Ser: 0.69 mg/dL (ref 0.61–1.24)
GFR, Estimated: >60 mL/min (ref >60)
Glucose, Bld: 146 mg/dL — ABNORMAL HIGH (ref 70–99)
Potassium: 3.6 mmol/L (ref 3.5–5.1)
Sodium: 138 mmol/L (ref 135–145)
Total Bilirubin: 0.7 mg/dL (ref 0.0–1.2)
Total Protein: 6.6 g/dL (ref 6.5–8.1)

## 2024-05-07 LAB — CBC WITH DIFFERENTIAL/PLATELET
Abs Immature Granulocytes: 0.03 K/uL (ref 0.00–0.07)
Basophils Absolute: 0.1 K/uL (ref 0.0–0.1)
Basophils Relative: 1 %
Eosinophils Absolute: 0.1 K/uL (ref 0.0–0.5)
Eosinophils Relative: 1 %
HCT: 37.9 % — ABNORMAL LOW (ref 39.0–52.0)
Hemoglobin: 12.9 g/dL — ABNORMAL LOW (ref 13.0–17.0)
Immature Granulocytes: 0 %
Lymphocytes Relative: 25 %
Lymphs Abs: 2.1 K/uL (ref 0.7–4.0)
MCH: 29.3 pg (ref 26.0–34.0)
MCHC: 34 g/dL (ref 30.0–36.0)
MCV: 86.1 fL (ref 80.0–100.0)
Monocytes Absolute: 0.6 K/uL (ref 0.1–1.0)
Monocytes Relative: 7 %
Neutro Abs: 5.5 K/uL (ref 1.7–7.7)
Neutrophils Relative %: 66 %
Platelets: 326 K/uL (ref 150–400)
RBC: 4.4 MIL/uL (ref 4.22–5.81)
RDW: 12.2 % (ref 11.5–15.5)
WBC: 8.4 K/uL (ref 4.0–10.5)
nRBC: 0 % (ref 0.0–0.2)

## 2024-05-07 LAB — ETHANOL: Alcohol, Ethyl (B): <15 mg/dL (ref <15)

## 2024-05-07 LAB — SALICYLATE LEVEL: Salicylate Lvl: <7 mg/dL — ABNORMAL LOW (ref 7.0–30.0)

## 2024-05-07 LAB — ACETAMINOPHEN LEVEL: Acetaminophen (Tylenol), Serum: <10 ug/mL — ABNORMAL LOW (ref 10–30)

## 2024-05-07 NOTE — ED Provider Notes (Signed)
 [place] [facility]ERGENCY DEPARTMENT AT [facility] Provider Note   CSN: [id number] Arrival date & time: 05/06/24  2206     Patient presents with: Ingestion   Jason Wolf is a 43 y.o. male.   The history is provided by the patient.  Ingestion   He has history of hypertension, diabetes, hepatitis C, bipolar disorder, ethanol abuse, opiate abuse and comes in after having taken 64 tablets with dextromethorphan.  Ingestion was at about 6 PM.  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  He has no current complaints.  He denies homicidal and suicidal ideation.  Apparently, EMS was called because his roommate noted he was twitching.  He is requesting assistance with addiction services.  He denies ethanol and drug use.    Prior to Admission medications   Medication Sig Start Date End Date Taking? Authorizing Provider  Calcium  Carbonate Antacid (TUMS CHEWY DELIGHTS PO) Take 1 tablet by mouth as needed (heartburn).    [provider]  Continuous Blood Gluc Sensor (FREESTYLE LIBRE 3 SENSOR) MISC Change sensor every 14 days. 05/18/22     Glecaprevir -Pibrentasvir  (MAVYRET ) 100-40 MG TABS Take 3 tablets by mouth daily with breakfast. Patient not taking: Reported on 04/10/2024 01/10/24   Kuppelweiser, Cassie L, RPH-CPP  glucose monitoring kit (FREESTYLE) monitoring kit 1 each by Does not apply route 4 (four) times daily - after meals and at bedtime. 1 month Diabetic Testing Supplies for QAC-QHS accuchecks. 02/23/20   Ghimire, Donalda HERO, MD  hydrOXYzine  (VISTARIL ) 25 MG capsule Take 25 mg by mouth 2 (two) times daily as needed for anxiety.    [provider]  Insulin  Pen Needle (PEN NEEDLES) 32G X 4 MM MISC Use as instructed. Inject into the skin 5 times per day 06/29/20   Fleming, Zelda W, NP  losartan  (COZAAR ) 50 MG tablet Take 50 mg by mouth daily. 03/24/24   [provider]  meloxicam (MOBIC) 15 MG tablet Take 15 mg by  mouth daily. 04/03/24   [provider]  MOUNJARO  7.5 MG/0.5ML Pen Inject 7.5 mg into the skin every Friday.    [provider]  sertraline  (ZOLOFT ) 100 MG tablet Take 100 mg by mouth daily.    [provider]  traZODone  (DESYREL ) 50 MG tablet Take 50 mg by mouth at bedtime. 04/12/23   [provider]    Allergies: Bee venom, Wasp venom, Yellow jacket venom, and Codeine    Review of Systems  All other systems reviewed and are negative.   Updated Vital Signs BP (!) 165/88   Pulse 100   Temp 98.7 F (37.1 C) (Oral)   Resp 19   SpO2 100%   Physical Exam Vitals and nursing note reviewed.   43 year old male, resting comfortably and in no acute distress. Vital signs are significant for elevated blood pressure. Oxygen saturation is 100%, which is normal. Head is normocephalic and atraumatic. PERRLA, EOMI. Neck is nontender and supple. Lungs are clear without rales, wheezes, or rhonchi. Heart has regular rate and rhythm without murmur. Abdomen is soft, flat, nontender. Skin is warm and dry without rash. Neurologic: Mental status is normal, cranial nerves are intact, moves all extremities equally.  (all labs ordered are listed, but only abnormal results are displayed) Labs Reviewed - No data to display    Procedures   Medications Ordered in the ED - No data to display  Medical Decision Making Amount and/or Complexity of Data Reviewed Labs: ordered.   Accidental overdose of dextromethorphan.  He is currently 6.5 hours following ingestion, so I feel he is likely passed the critical time of an overdose.  I have consulted poison control and they confirm that if he is awake and alert 6 hours following ingestion, no additional observation is necessary.  I have ordered screening labs including drug screen and ethanol level.  I have reviewed his past records, and note ED visit on 04/30/2023 for similar overdose.  I  reviewed his laboratory tests, my interpretation is undetectable ethanol and acetaminophen  and salicylate, metabolic panel significant only for elevated glucose consistent with known history of diabetes, borderline anemia not felt to be clinically significant.  He has had no clinical problems while being observed in the emergency department and is safe for discharge.  I am giving him outpatient resources for substance abuse treatment.     Final diagnoses:  Poisoning by dextromethorphan, accidental or unintentional, initial encounter    ED Discharge Orders     None          Raford Lenis, MD 05/07/24 [phone]

## 2024-05-07 NOTE — ED Notes (Signed)
 Pt was given a [place] Sandwich, PB, [employer], and Cola.

## 2024-05-07 NOTE — Discharge Instructions (Signed)
 Please follow-up with one of the centers that work with drug abuse treatment.  Whenever you take medications, follow the instructions given to you.  For over-the-counter medications, do not exceed the recommended amount, and only use them for the approved indications.

## 2024-05-18 DIAGNOSIS — J069 Acute upper respiratory infection, unspecified: Secondary | ICD-10-CM | POA: Diagnosis not present

## 2024-06-03 ENCOUNTER — Encounter (HOSPITAL_COMMUNITY): Payer: Self-pay

## 2024-06-03 ENCOUNTER — Emergency Department (HOSPITAL_COMMUNITY)
Admission: EM | Admit: 2024-06-03 | Discharge: 2024-06-04 | Disposition: A | Discharge status: Home / Self Care | Attending: Emergency Medicine | Admitting: Emergency Medicine

## 2024-06-03 ENCOUNTER — Other Ambulatory Visit: Payer: Self-pay

## 2024-06-03 DIAGNOSIS — E119 Type 2 diabetes mellitus without complications: Secondary | ICD-10-CM | POA: Insufficient documentation

## 2024-06-03 DIAGNOSIS — I10 Essential (primary) hypertension: Secondary | ICD-10-CM | POA: Insufficient documentation

## 2024-06-03 DIAGNOSIS — Z794 Long term (current) use of insulin: Secondary | ICD-10-CM | POA: Insufficient documentation

## 2024-06-03 DIAGNOSIS — F17200 Nicotine dependence, unspecified, uncomplicated: Secondary | ICD-10-CM | POA: Insufficient documentation

## 2024-06-03 DIAGNOSIS — T50901A Poisoning by unspecified drugs, medicaments and biological substances, accidental (unintentional), initial encounter: Secondary | ICD-10-CM | POA: Insufficient documentation

## 2024-06-03 DIAGNOSIS — F29 Unspecified psychosis not due to a substance or known physiological condition: Secondary | ICD-10-CM | POA: Diagnosis not present

## 2024-06-03 DIAGNOSIS — R45851 Suicidal ideations: Secondary | ICD-10-CM | POA: Diagnosis not present

## 2024-06-03 DIAGNOSIS — T485X1A Poisoning by other anti-common-cold drugs, accidental (unintentional), initial encounter: Secondary | ICD-10-CM | POA: Diagnosis not present

## 2024-06-03 DIAGNOSIS — R451 Restlessness and agitation: Secondary | ICD-10-CM | POA: Insufficient documentation

## 2024-06-03 DIAGNOSIS — R000 Tachycardia, unspecified: Secondary | ICD-10-CM | POA: Diagnosis not present

## 2024-06-03 DIAGNOSIS — Z79899 Other long term (current) drug therapy: Secondary | ICD-10-CM | POA: Diagnosis not present

## 2024-06-03 LAB — CBC WITH DIFFERENTIAL/PLATELET
Abs Immature Granulocytes: 0.04 K/uL (ref 0.00–0.07)
Basophils Absolute: 0.1 K/uL (ref 0.0–0.1)
Basophils Relative: 1 %
Eosinophils Absolute: 0.2 K/uL (ref 0.0–0.5)
Eosinophils Relative: 2 %
HCT: 38.2 % — ABNORMAL LOW (ref 39.0–52.0)
Hemoglobin: 13.6 g/dL (ref 13.0–17.0)
Immature Granulocytes: 1 %
Lymphocytes Relative: 18 %
Lymphs Abs: 1.6 K/uL (ref 0.7–4.0)
MCH: 29.7 pg (ref 26.0–34.0)
MCHC: 35.6 g/dL (ref 30.0–36.0)
MCV: 83.4 fL (ref 80.0–100.0)
Monocytes Absolute: 0.6 K/uL (ref 0.1–1.0)
Monocytes Relative: 6 %
Neutro Abs: 6.4 K/uL (ref 1.7–7.7)
Neutrophils Relative %: 72 %
Platelets: 353 K/uL (ref 150–400)
RBC: 4.58 MIL/uL (ref 4.22–5.81)
RDW: 11.9 % (ref 11.5–15.5)
WBC: 8.9 K/uL (ref 4.0–10.5)
nRBC: 0 % (ref 0.0–0.2)

## 2024-06-03 LAB — COMPREHENSIVE METABOLIC PANEL WITH GFR
ALT: 22 U/L (ref 0–44)
AST: 19 U/L (ref 15–41)
Albumin: 3.8 g/dL (ref 3.5–5.0)
Alkaline Phosphatase: 63 U/L (ref 38–126)
Anion gap: 12 (ref 5–15)
BUN: 8 mg/dL (ref 6–20)
CO2: 23 mmol/L (ref 22–32)
Calcium: 8.4 mg/dL — ABNORMAL LOW (ref 8.9–10.3)
Chloride: 93 mmol/L — ABNORMAL LOW (ref 98–111)
Creatinine, Ser: 0.72 mg/dL (ref 0.61–1.24)
GFR, Estimated: >60 mL/min (ref >60)
Glucose, Bld: 274 mg/dL — ABNORMAL HIGH (ref 70–99)
Potassium: 3.9 mmol/L (ref 3.5–5.1)
Sodium: 128 mmol/L — ABNORMAL LOW (ref 135–145)
Total Bilirubin: 0.6 mg/dL (ref 0.0–1.2)
Total Protein: 7.2 g/dL (ref 6.5–8.1)

## 2024-06-03 LAB — MAGNESIUM: Magnesium: 1.8 mg/dL (ref 1.7–2.4)

## 2024-06-03 MED ORDER — SODIUM CHLORIDE 0.9 % IV BOLUS
1000.0000 mL | Freq: Once | INTRAVENOUS | Status: AC
  Administered 2024-06-03: 1000 mL via INTRAVENOUS

## 2024-06-03 NOTE — ED Notes (Signed)
 Per Poison Control:   S/Sx; agitation, confusion, tachycardia, Htn, Seizures  Observe 6-8 hours   EKG 4 hour tylenol  level Other basic labs, magnesium   Treat with benzos as needed for agitation

## 2024-06-04 LAB — ACETAMINOPHEN LEVEL
Acetaminophen (Tylenol), Serum: <10 ug/mL — ABNORMAL LOW (ref 10–30)
Acetaminophen (Tylenol), Serum: <10 ug/mL — ABNORMAL LOW (ref 10–30)

## 2024-06-04 NOTE — ED Provider Notes (Signed)
  EMERGENCY DEPARTMENT AT [facility] Provider Note   CSN: [id number] Arrival date & time: 06/03/24  2227     Patient presents with: Ingestion   Jason Wolf is a 43 y.o. male.  Patient with past medical history significant for hypertension, episodic opiate abuse, type II DM, alcohol abuse, bipolar disorder presents to the Emergency Department complaining of overdose on Coricidin cough and cold.  He reports taking approximately 80 tablets of the medication.  He is unable/unwilling to tell me exactly what time but believes it was after dinner.  He states he did this in order to get high.  He denies any suicidal ideation or homicidal ideation.  He states he is addicted to cold medications.  He was seen in early October after taking a reportedly large amount of dextromethorphan.  This was also thought to be an unintentional overdose but potentially done due to addiction.   HPI     Prior to Admission medications   Medication Sig Start Date End Date Taking? Authorizing Provider  Calcium  Carbonate Antacid (TUMS CHEWY DELIGHTS PO) Take 1 tablet by mouth as needed (heartburn).    [provider]  Continuous Blood Gluc Sensor (FREESTYLE LIBRE 3 SENSOR) MISC Change sensor every 14 days. 05/18/22     Glecaprevir -Pibrentasvir  (MAVYRET ) 100-40 MG TABS Take 3 tablets by mouth daily with breakfast. Patient not taking: Reported on 04/10/2024 01/10/24   Kuppelweiser, Cassie L, RPH-CPP  glucose monitoring kit (FREESTYLE) monitoring kit 1 each by Does not apply route 4 (four) times daily - after meals and at bedtime. 1 month Diabetic Testing Supplies for QAC-QHS accuchecks. 02/23/20   Ghimire, Donalda HERO, MD  hydrOXYzine  (VISTARIL ) 25 MG capsule Take 25 mg by mouth 2 (two) times daily as needed for anxiety.    [provider]  Insulin  Pen Needle (PEN NEEDLES) 32G X 4 MM MISC Use as instructed. Inject into the skin 5 times per day 06/29/20   Fleming, Zelda W, NP  losartan   (COZAAR ) 50 MG tablet Take 50 mg by mouth daily. 03/24/24   [provider]  meloxicam (MOBIC) 15 MG tablet Take 15 mg by mouth daily. 04/03/24   [provider]  MOUNJARO  7.5 MG/0.5ML Pen Inject 7.5 mg into the skin every Friday.    [provider]  sertraline  (ZOLOFT ) 100 MG tablet Take 100 mg by mouth daily.    [provider]  traZODone  (DESYREL ) 50 MG tablet Take 50 mg by mouth at bedtime. 04/12/23   [provider]    Allergies: Bee venom, Wasp venom, Yellow jacket venom, and Codeine    Review of Systems  Updated Vital Signs BP (!) 167/107   Pulse 90   Temp 99.6 F (37.6 C) (Oral)   Resp (!) 21   SpO2 98%   Physical Exam Vitals and nursing note reviewed.  Constitutional:      General: He is not in acute distress. HENT:     Head: Normocephalic.  Eyes:     Conjunctiva/sclera: Conjunctivae normal.     Pupils: Pupils are equal, round, and reactive to light.  Cardiovascular:     Rate and Rhythm: Normal rate and regular rhythm.  Pulmonary:     Effort: Pulmonary effort is normal.     Breath sounds: Normal breath sounds.  Abdominal:     Palpations: Abdomen is soft.     Tenderness: There is no abdominal tenderness.  Skin:    General: Skin is warm and dry.  Neurological:  Mental Status: He is alert.     (all labs ordered are listed, but only abnormal results are displayed) Labs Reviewed  COMPREHENSIVE METABOLIC PANEL WITH GFR - Abnormal; Notable for the following components:      Result Value   Sodium 128 (*)    Chloride 93 (*)    Glucose, Bld 274 (*)    Calcium  8.4 (*)    All other components within normal limits  CBC WITH DIFFERENTIAL/PLATELET - Abnormal; Notable for the following components:   HCT 38.2 (*)    All other components within normal limits  ACETAMINOPHEN  LEVEL - Abnormal; Notable for the following components:   Acetaminophen  (Tylenol ), Serum <10 (*)    All other components within normal limits   ACETAMINOPHEN  LEVEL - Abnormal; Notable for the following components:   Acetaminophen  (Tylenol ), Serum <10 (*)    All other components within normal limits  MAGNESIUM    EKG: EKG Interpretation Date/Time:  Thursday June 04 2024 04:31:42 EST Ventricular Rate:  85 PR Interval:  176 QRS Duration:  113 QT Interval:  420 QTC Calculation: 500 R Axis:   14  Text Interpretation: Sinus rhythm Borderline intraventricular conduction delay Low voltage, precordial leads Borderline prolonged QT interval No significant change since last tracing Confirmed by Haze Lonni PARAS [phone]) on 06/04/2024 4:36:35 AM  Radiology: No results found.   Procedures   Medications Ordered in the ED  sodium chloride  0.9 % bolus 1,000 mL (0 mLs Intravenous Stopped 06/04/24 0224)                                    Medical Decision Making Amount and/or Complexity of Data Reviewed Labs: ordered.   This patient presents to the ED for concern of possible overdose on cough medication.  Differential includes intentional overdose, suicidal attempt, accidental overdose, others   Co morbidities / Chronic conditions that complicate the patient evaluation  As noted in HPI   Additional history obtained:  Additional history obtained from EMR   Lab Tests:  I Ordered, and personally interpreted labs.  The pertinent results include: Negative Tylenol  level x 2, grossly unremarkable labs    Cardiac Monitoring: / EKG:  The patient was maintained on a cardiac monitor.  I personally viewed and interpreted the cardiac monitored which showed an underlying rhythm of: Sinus rhythm   Problem List / ED Course / Critical interventions / Medication management   I ordered medication including saline bolus Reevaluation of the patient after these medicines showed that the patient improved I have reviewed the patients home medicines and have made adjustments as needed   Consultations Obtained:  I requested  consultation with the [facility],  and discussed lab and imaging findings as well as pertinent plan - they recommend: 8-hour observation, 4-hour Tylenol  level   Social Determinants of Health:  Patient is a daily smoker   Test / Admission - Considered:  Patient observed for 8 hours with improvement in appearance.  His workup has been reassuring.  There is no indication at this time for further emergent workup or admission.  Patient states he took this medication to get high and has been doing the same for over a decade.  Return precautions have been provided.      Final diagnoses:  Accidental overdose, initial encounter    ED Discharge Orders     None          [place],  Ubaldo NOVAK, PA-C 06/04/24 [id number]    Haze Lonni PARAS, MD 06/04/24 [phone]

## 2024-06-04 NOTE — Discharge Instructions (Signed)
 You were seen this evening after taking 80 doses of cough medication.  Please seek help for substance abuse treatment.  Return to the emergency department if you develop any life-threatening symptoms.

## 2024-06-05 DIAGNOSIS — F411 Generalized anxiety disorder: Secondary | ICD-10-CM | POA: Diagnosis not present

## 2024-06-18 ENCOUNTER — Encounter (HOSPITAL_COMMUNITY): Payer: Self-pay

## 2024-06-18 ENCOUNTER — Other Ambulatory Visit: Payer: Self-pay

## 2024-06-18 ENCOUNTER — Emergency Department (HOSPITAL_COMMUNITY)
Admission: EM | Admit: 2024-06-18 | Discharge: 2024-06-19 | Disposition: A | Discharge status: Other Acute Inpatient Hospital | Source: Home / Self Care | Attending: Emergency Medicine | Admitting: Emergency Medicine

## 2024-06-18 DIAGNOSIS — R569 Unspecified convulsions: Secondary | ICD-10-CM | POA: Diagnosis not present

## 2024-06-18 DIAGNOSIS — Z566 Other physical and mental strain related to work: Secondary | ICD-10-CM | POA: Diagnosis not present

## 2024-06-18 DIAGNOSIS — R410 Disorientation, unspecified: Secondary | ICD-10-CM | POA: Diagnosis not present

## 2024-06-18 DIAGNOSIS — F1910 Other psychoactive substance abuse, uncomplicated: Secondary | ICD-10-CM | POA: Diagnosis not present

## 2024-06-18 DIAGNOSIS — T483X2D Poisoning by antitussives, intentional self-harm, subsequent encounter: Secondary | ICD-10-CM | POA: Diagnosis not present

## 2024-06-18 DIAGNOSIS — I10 Essential (primary) hypertension: Secondary | ICD-10-CM | POA: Diagnosis not present

## 2024-06-18 DIAGNOSIS — Z91030 Bee allergy status: Secondary | ICD-10-CM | POA: Diagnosis not present

## 2024-06-18 DIAGNOSIS — F332 Major depressive disorder, recurrent severe without psychotic features: Secondary | ICD-10-CM | POA: Diagnosis not present

## 2024-06-18 DIAGNOSIS — R000 Tachycardia, unspecified: Secondary | ICD-10-CM | POA: Diagnosis not present

## 2024-06-18 DIAGNOSIS — T483X1A Poisoning by antitussives, accidental (unintentional), initial encounter: Secondary | ICD-10-CM | POA: Insufficient documentation

## 2024-06-18 DIAGNOSIS — F419 Anxiety disorder, unspecified: Secondary | ICD-10-CM | POA: Diagnosis present

## 2024-06-18 DIAGNOSIS — Z885 Allergy status to narcotic agent status: Secondary | ICD-10-CM | POA: Diagnosis not present

## 2024-06-18 DIAGNOSIS — Z833 Family history of diabetes mellitus: Secondary | ICD-10-CM | POA: Diagnosis not present

## 2024-06-18 DIAGNOSIS — T50904A Poisoning by unspecified drugs, medicaments and biological substances, undetermined, initial encounter: Secondary | ICD-10-CM

## 2024-06-18 DIAGNOSIS — F17210 Nicotine dependence, cigarettes, uncomplicated: Secondary | ICD-10-CM | POA: Diagnosis present

## 2024-06-18 DIAGNOSIS — T887XXA Unspecified adverse effect of drug or medicament, initial encounter: Secondary | ICD-10-CM | POA: Diagnosis not present

## 2024-06-18 DIAGNOSIS — Z794 Long term (current) use of insulin: Secondary | ICD-10-CM | POA: Diagnosis not present

## 2024-06-18 DIAGNOSIS — Z79899 Other long term (current) drug therapy: Secondary | ICD-10-CM | POA: Insufficient documentation

## 2024-06-18 DIAGNOSIS — Z91038 Other insect allergy status: Secondary | ICD-10-CM | POA: Diagnosis not present

## 2024-06-18 DIAGNOSIS — T483X4A Poisoning by antitussives, undetermined, initial encounter: Secondary | ICD-10-CM | POA: Diagnosis not present

## 2024-06-18 DIAGNOSIS — E119 Type 2 diabetes mellitus without complications: Secondary | ICD-10-CM | POA: Diagnosis not present

## 2024-06-18 LAB — CBC WITH DIFFERENTIAL/PLATELET
Abs Immature Granulocytes: 0.03 K/uL (ref 0.00–0.07)
Basophils Absolute: 0.1 K/uL (ref 0.0–0.1)
Basophils Relative: 1 %
Eosinophils Absolute: 0.1 K/uL (ref 0.0–0.5)
Eosinophils Relative: 1 %
HCT: 42.6 % (ref 39.0–52.0)
Hemoglobin: 15.3 g/dL (ref 13.0–17.0)
Immature Granulocytes: 0 %
Lymphocytes Relative: 20 %
Lymphs Abs: 1.9 K/uL (ref 0.7–4.0)
MCH: 30.8 pg (ref 26.0–34.0)
MCHC: 35.9 g/dL (ref 30.0–36.0)
MCV: 85.7 fL (ref 80.0–100.0)
Monocytes Absolute: 0.7 K/uL (ref 0.1–1.0)
Monocytes Relative: 7 %
Neutro Abs: 6.7 K/uL (ref 1.7–7.7)
Neutrophils Relative %: 71 %
Platelets: 435 K/uL — ABNORMAL HIGH (ref 150–400)
RBC: 4.97 MIL/uL (ref 4.22–5.81)
RDW: 12.6 % (ref 11.5–15.5)
WBC: 9.4 K/uL (ref 4.0–10.5)
nRBC: 0 % (ref 0.0–0.2)

## 2024-06-18 LAB — CBG MONITORING, ED: Glucose-Capillary: 134 mg/dL — ABNORMAL HIGH (ref 70–99)

## 2024-06-18 MED ORDER — SODIUM CHLORIDE 0.9 % IV BOLUS
1000.0000 mL | Freq: Once | INTRAVENOUS | Status: AC
  Administered 2024-06-18: 1000 mL via INTRAVENOUS

## 2024-06-18 NOTE — ED Triage Notes (Addendum)
 PT BIB [facility], Per EMS PT took 900 mg of Dextromethopharn un sure if taken intentionally to hurt himself. PT involuntary jerking of limbs, intermittent confusion, diaphoretic. Unknown time of ingestion suspected to be >2 hrs

## 2024-06-19 ENCOUNTER — Encounter (HOSPITAL_COMMUNITY): Payer: Self-pay | Admitting: Psychiatry

## 2024-06-19 ENCOUNTER — Inpatient Hospital Stay (HOSPITAL_COMMUNITY)
Admission: AD | Admit: 2024-06-19 | Discharge: 2024-06-24 | DRG: 885 | Disposition: A | Discharge status: Home / Self Care | Source: Intra-hospital

## 2024-06-19 DIAGNOSIS — Z91030 Bee allergy status: Secondary | ICD-10-CM

## 2024-06-19 DIAGNOSIS — F332 Major depressive disorder, recurrent severe without psychotic features: Secondary | ICD-10-CM | POA: Diagnosis present

## 2024-06-19 DIAGNOSIS — Z885 Allergy status to narcotic agent status: Secondary | ICD-10-CM

## 2024-06-19 DIAGNOSIS — F17210 Nicotine dependence, cigarettes, uncomplicated: Secondary | ICD-10-CM | POA: Diagnosis present

## 2024-06-19 DIAGNOSIS — I10 Essential (primary) hypertension: Secondary | ICD-10-CM | POA: Diagnosis present

## 2024-06-19 DIAGNOSIS — F419 Anxiety disorder, unspecified: Secondary | ICD-10-CM | POA: Diagnosis present

## 2024-06-19 DIAGNOSIS — E119 Type 2 diabetes mellitus without complications: Secondary | ICD-10-CM | POA: Diagnosis present

## 2024-06-19 DIAGNOSIS — Z79899 Other long term (current) drug therapy: Secondary | ICD-10-CM

## 2024-06-19 DIAGNOSIS — Z833 Family history of diabetes mellitus: Secondary | ICD-10-CM

## 2024-06-19 DIAGNOSIS — T483X2D Poisoning by antitussives, intentional self-harm, subsequent encounter: Secondary | ICD-10-CM

## 2024-06-19 DIAGNOSIS — Z566 Other physical and mental strain related to work: Secondary | ICD-10-CM

## 2024-06-19 DIAGNOSIS — Z91038 Other insect allergy status: Secondary | ICD-10-CM

## 2024-06-19 LAB — COMPREHENSIVE METABOLIC PANEL WITH GFR
ALT: 22 U/L (ref 0–44)
AST: 18 U/L (ref 15–41)
Albumin: 4.3 g/dL (ref 3.5–5.0)
Alkaline Phosphatase: 96 U/L (ref 38–126)
Anion gap: 11 (ref 5–15)
BUN: 8 mg/dL (ref 6–20)
CO2: 25 mmol/L (ref 22–32)
Calcium: 9.4 mg/dL (ref 8.9–10.3)
Chloride: 104 mmol/L (ref 98–111)
Creatinine, Ser: 0.76 mg/dL (ref 0.61–1.24)
GFR, Estimated: >60 mL/min (ref >60)
Glucose, Bld: 114 mg/dL — ABNORMAL HIGH (ref 70–99)
Potassium: 3.8 mmol/L (ref 3.5–5.1)
Sodium: 139 mmol/L (ref 135–145)
Total Bilirubin: 0.6 mg/dL (ref 0.0–1.2)
Total Protein: 7.5 g/dL (ref 6.5–8.1)

## 2024-06-19 LAB — I-STAT CHEM 8, ED
BUN: 6 mg/dL (ref 6–20)
Calcium, Ion: 1.11 mmol/L — ABNORMAL LOW (ref 1.15–1.40)
Chloride: 106 mmol/L (ref 98–111)
Creatinine, Ser: 0.9 mg/dL (ref 0.61–1.24)
Glucose, Bld: 71 mg/dL (ref 70–99)
HCT: 42 % (ref 39.0–52.0)
Hemoglobin: 14.3 g/dL (ref 13.0–17.0)
Potassium: 4 mmol/L (ref 3.5–5.1)
Sodium: 140 mmol/L (ref 135–145)
TCO2: 24 mmol/L (ref 22–32)

## 2024-06-19 LAB — SALICYLATE LEVEL: Salicylate Lvl: <7 mg/dL — ABNORMAL LOW (ref 7.0–30.0)

## 2024-06-19 LAB — GLUCOSE, CAPILLARY
Glucose-Capillary: 204 mg/dL — ABNORMAL HIGH (ref 70–99)
Glucose-Capillary: 319 mg/dL — ABNORMAL HIGH (ref 70–99)

## 2024-06-19 LAB — URINE DRUG SCREEN
Amphetamines: NEGATIVE
Barbiturates: NEGATIVE
Benzodiazepines: NEGATIVE
Cocaine: NEGATIVE
Fentanyl: POSITIVE — AB
Methadone Scn, Ur: NEGATIVE
Opiates: NEGATIVE
Tetrahydrocannabinol: NEGATIVE

## 2024-06-19 LAB — ACETAMINOPHEN LEVEL: Acetaminophen (Tylenol), Serum: <10 ug/mL — ABNORMAL LOW (ref 10–30)

## 2024-06-19 LAB — ETHANOL: Alcohol, Ethyl (B): <15 mg/dL (ref <15)

## 2024-06-19 LAB — MAGNESIUM: Magnesium: 2.1 mg/dL (ref 1.7–2.4)

## 2024-06-19 MED ORDER — HALOPERIDOL 5 MG PO TABS: 5.0000 mg | ORAL_TABLET | Freq: Three times a day (TID) | ORAL | Status: DC | PRN

## 2024-06-19 MED ORDER — HALOPERIDOL LACTATE 5 MG/ML IJ SOLN
10.0000 mg | Freq: Three times a day (TID) | INTRAMUSCULAR | Status: DC | PRN
Start: 2024-06-19 — End: 2024-06-24

## 2024-06-19 MED ORDER — POTASSIUM CHLORIDE CRYS ER 20 MEQ PO TBCR
40.0000 meq | EXTENDED_RELEASE_TABLET | Freq: Once | ORAL | Status: AC
  Administered 2024-06-19: 40 meq via ORAL
  Filled 2024-06-19: qty 2

## 2024-06-19 MED ORDER — LORAZEPAM 2 MG/ML IJ SOLN: 2.0000 mg | Freq: Three times a day (TID) | INTRAMUSCULAR | Status: DC | PRN

## 2024-06-19 MED ORDER — SERTRALINE HCL 50 MG PO TABS
50.0000 mg | ORAL_TABLET | Freq: Every day | ORAL | Status: DC
  Administered 2024-06-19 – 2024-06-20 (×2): 50 mg via ORAL
  Filled 2024-06-19 (×2): qty 1

## 2024-06-19 MED ORDER — HYDROXYZINE HCL 25 MG PO TABS
25.0000 mg | ORAL_TABLET | Freq: Three times a day (TID) | ORAL | Status: DC | PRN
Start: 2024-06-19 — End: 2024-06-24
  Administered 2024-06-19 – 2024-06-23 (×5): 25 mg via ORAL
  Filled 2024-06-19 (×5): qty 1

## 2024-06-19 MED ORDER — TIRZEPATIDE 7.5 MG/0.5ML ~~LOC~~ SOAJ: 7.5000 mg | SUBCUTANEOUS | Status: DC

## 2024-06-19 MED ORDER — INSULIN ASPART 100 UNIT/ML IJ SOLN
0.0000 [IU] | Freq: Every day | INTRAMUSCULAR | Status: DC
  Administered 2024-06-19 – 2024-06-20 (×2): 4 [IU] via SUBCUTANEOUS
  Administered 2024-06-21: 5 [IU] via SUBCUTANEOUS
  Administered 2024-06-22: 2 [IU] via SUBCUTANEOUS
  Administered 2024-06-23: 4 [IU] via SUBCUTANEOUS

## 2024-06-19 MED ORDER — ALUM & MAG HYDROXIDE-SIMETH 200-200-20 MG/5ML PO SUSP: 30.0000 mL | Freq: Four times a day (QID) | ORAL | Status: DC | PRN

## 2024-06-19 MED ORDER — DIPHENHYDRAMINE HCL 50 MG/ML IJ SOLN
50.0000 mg | Freq: Three times a day (TID) | INTRAMUSCULAR | Status: DC | PRN
Start: 2024-06-19 — End: 2024-06-24

## 2024-06-19 MED ORDER — LOSARTAN POTASSIUM 50 MG PO TABS: 50.0000 mg | ORAL_TABLET | Freq: Every day | ORAL | Status: DC

## 2024-06-19 MED ORDER — LOSARTAN POTASSIUM 50 MG PO TABS
50.0000 mg | ORAL_TABLET | Freq: Every day | ORAL | Status: DC
  Administered 2024-06-19 – 2024-06-24 (×6): 50 mg via ORAL
  Filled 2024-06-19 (×6): qty 1

## 2024-06-19 MED ORDER — MAGNESIUM HYDROXIDE 400 MG/5ML PO SUSP
30.0000 mL | Freq: Every day | ORAL | Status: DC | PRN
Start: 2024-06-19 — End: 2024-06-24

## 2024-06-19 MED ORDER — HALOPERIDOL LACTATE 5 MG/ML IJ SOLN: 5.0000 mg | Freq: Three times a day (TID) | INTRAMUSCULAR | Status: DC | PRN

## 2024-06-19 MED ORDER — ALUM & MAG HYDROXIDE-SIMETH 200-200-20 MG/5ML PO SUSP: 30.0000 mL | ORAL | Status: DC | PRN

## 2024-06-19 MED ORDER — TRAZODONE HCL 100 MG PO TABS
100.0000 mg | ORAL_TABLET | Freq: Every evening | ORAL | Status: DC | PRN
  Administered 2024-06-19 – 2024-06-23 (×5): 100 mg via ORAL
  Filled 2024-06-19 (×5): qty 1

## 2024-06-19 MED ORDER — HYDROXYZINE PAMOATE 25 MG PO CAPS: 25.0000 mg | ORAL_CAPSULE | Freq: Two times a day (BID) | ORAL | Status: DC | PRN

## 2024-06-19 MED ORDER — NICOTINE 21 MG/24HR TD PT24
21.0000 mg | MEDICATED_PATCH | Freq: Every day | TRANSDERMAL | Status: DC
  Administered 2024-06-19: 21 mg via TRANSDERMAL
  Filled 2024-06-19: qty 1

## 2024-06-19 MED ORDER — LORAZEPAM 2 MG/ML IJ SOLN
2.0000 mg | Freq: Three times a day (TID) | INTRAMUSCULAR | Status: DC | PRN
Start: 2024-06-19 — End: 2024-06-24

## 2024-06-19 MED ORDER — ACETAMINOPHEN 325 MG PO TABS
650.0000 mg | ORAL_TABLET | Freq: Four times a day (QID) | ORAL | Status: DC | PRN
  Administered 2024-06-19: 650 mg via ORAL
  Filled 2024-06-19: qty 2

## 2024-06-19 MED ORDER — DIPHENHYDRAMINE HCL 50 MG/ML IJ SOLN: 50.0000 mg | Freq: Three times a day (TID) | INTRAMUSCULAR | Status: DC | PRN

## 2024-06-19 MED ORDER — INSULIN ASPART 100 UNIT/ML IJ SOLN
0.0000 [IU] | Freq: Three times a day (TID) | INTRAMUSCULAR | Status: DC
  Administered 2024-06-19: 5 [IU] via SUBCUTANEOUS
  Administered 2024-06-20: 11 [IU] via SUBCUTANEOUS
  Administered 2024-06-20: 8 [IU] via SUBCUTANEOUS
  Administered 2024-06-20: 3 [IU] via SUBCUTANEOUS
  Administered 2024-06-21: 8 [IU] via SUBCUTANEOUS
  Administered 2024-06-21: 11 [IU] via SUBCUTANEOUS
  Administered 2024-06-21: 5 [IU] via SUBCUTANEOUS
  Administered 2024-06-22: 15 [IU] via SUBCUTANEOUS
  Administered 2024-06-22: 5 [IU] via SUBCUTANEOUS
  Administered 2024-06-22: 8 [IU] via SUBCUTANEOUS
  Administered 2024-06-23: 15 [IU] via SUBCUTANEOUS
  Administered 2024-06-23: 10 [IU] via SUBCUTANEOUS
  Administered 2024-06-23: 5 [IU] via SUBCUTANEOUS
  Administered 2024-06-24: 11 [IU] via SUBCUTANEOUS

## 2024-06-19 MED ORDER — DIPHENHYDRAMINE HCL 25 MG PO CAPS: 50.0000 mg | ORAL_CAPSULE | Freq: Three times a day (TID) | ORAL | Status: DC | PRN

## 2024-06-19 NOTE — Tx Team (Signed)
 Initial Treatment Plan 06/19/2024 6:51 PM [name] FMW:[id number]    PATIENT STRESSORS: Educational concerns   Health problems   Medication change or noncompliance   Occupational concerns   Substance abuse     PATIENT STRENGTHS: Capable of independent living  Communication skills  Supportive family/friends  Work skills    PATIENT IDENTIFIED PROBLEMS: Polysubstance Abuse (UDS + Fentanyl , I took 900 mg of Dextromethorphan).    Medication noncompliance I stopped taking my Zoloft  for a while back.    Educational Concerns I need to get back to school and complete my work.             DISCHARGE CRITERIA:  Improved stabilization in mood, thinking, and/or behavior Verbal commitment to aftercare and medication compliance Withdrawal symptoms are absent or subacute and managed without 24-hour nursing intervention  PRELIMINARY DISCHARGE PLAN: Outpatient therapy Return to previous living arrangement Return to previous work or school arrangements  PATIENT/FAMILY INVOLVEMENT: This treatment plan has been presented to and reviewed with the patient, Jason Wolf. The patient have been given the opportunity to ask questions and make suggestions.  Dorrine Montone, RN 06/19/2024, 6:51 PM

## 2024-06-19 NOTE — Plan of Care (Signed)
  Problem: Activity: Goal: Interest or engagement in activities will improve Outcome: Progressing   Problem: Safety: Goal: Periods of time without injury will increase Outcome: Progressing   Problem: Safety: Goal: Periods of time without injury will increase Outcome: Progressing
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
## 2024-06-19 NOTE — ED Notes (Signed)
 Pt given purple scrubs to change into. Pt given belongings bag to place personal items in
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
## 2024-06-19 NOTE — ED Notes (Signed)
 Poison control recommended Pt be given potassium to maintain serum level > 4.5. Provider notified and ordered 40mg  PO. Given as ordered.

## 2024-06-19 NOTE — Group Note (Deleted)
 Recreation Therapy Group Note   Group Topic:Problem Solving  Group Date: 06/19/2024 Start Time: 1005 End Time: 1030 Facilitators: Ercia Crisafulli-McCall, Aeon Koors A, NT Location: [address] Dayroom      Affect/Mood: {RT BHH Affect/Mood:26271}   Participation Level: {RT BHH Participation [employer]   Participation Quality: {RT BHH Participation Quality:26268}   Behavior: {RT BHH Group Behavior:26269}   Speech/Thought Process: {RT BHH Speech/Thought:26276}   Insight: {RT BHH Insight:26272}   Judgement: {RT BHH Judgement:26278}   Modes of Intervention: {RT BHH Modes of Intervention:26277}   Patient Response to Interventions:  {RT BHH Patient Response to Intervention:26274}   Education Outcome:  {RT BHH Education Outcome:26279}   Clinical Observations/Individualized Feedback: *** was *** in their participation of session activities and group discussion. Pt identified ***   Plan: {RT BHH Tx Plan:26280}   Mouhamed Glassco A Teriann Livingood-McCall, NT,  06/19/2024 12:15 PM

## 2024-06-19 NOTE — Group Note (Signed)
 Date:  06/19/2024 Time:  3:41 PM  Group Topic/Focus: Sleep Hygiene Dimensions of Wellness:   The focus of this group is to introduce the topic of wellness and discuss the role each dimension of wellness plays in total health.    Participation Level:  Did Not Attend   Jason Wolf 06/19/2024, 3:41 PM
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
## 2024-06-19 NOTE — ED Notes (Signed)
 Report given to olivette BANKER)

## 2024-06-19 NOTE — ED Notes (Addendum)
 [facility] transport called [facility]). States they will be here in 20 minutes

## 2024-06-19 NOTE — ED Provider Notes (Signed)
 [facility] AT [facility] Provider Note   CSN: [id number] Arrival date & time: 06/18/24  2255     Patient presents with: Drug Overdose   Jason Wolf is a 43 y.o. male.   The history is provided by the patient and the EMS personnel. The history is limited by the condition of the patient.  Drug Overdose This is a new problem. The current episode started 3 to 5 hours ago. The problem occurs constantly. The problem has not changed since onset.Pertinent negatives include no chest pain. Nothing aggravates the symptoms. Nothing relieves the symptoms. He has tried nothing for the symptoms. The treatment provided no relief.  Denies SI but took a whole bottle of dextromethorphan to dissociate.  Denies any co ingestion.       Prior to Admission medications   Medication Sig Start Date End Date Taking? Authorizing Provider  Calcium  Carbonate Antacid (TUMS CHEWY DELIGHTS PO) Take 1 tablet by mouth as needed (heartburn).    [provider]  Continuous Blood Gluc Sensor (FREESTYLE LIBRE 3 SENSOR) MISC Change sensor every 14 days. 05/18/22     Glecaprevir -Pibrentasvir  (MAVYRET ) 100-40 MG TABS Take 3 tablets by mouth daily with breakfast. Patient not taking: Reported on 04/10/2024 01/10/24   Kuppelweiser, Cassie L, RPH-CPP  glucose monitoring kit (FREESTYLE) monitoring kit 1 each by Does not apply route 4 (four) times daily - after meals and at bedtime. 1 month Diabetic Testing Supplies for QAC-QHS accuchecks. 02/23/20   Ghimire, Donalda HERO, MD  hydrOXYzine  (VISTARIL ) 25 MG capsule Take 25 mg by mouth 2 (two) times daily as needed for anxiety.    [provider]  Insulin  Pen Needle (PEN NEEDLES) 32G X 4 MM MISC Use as instructed. Inject into the skin 5 times per day 06/29/20   Fleming, Zelda W, NP  losartan  (COZAAR ) 50 MG tablet Take 50 mg by mouth daily. 03/24/24   [provider]  meloxicam (MOBIC) 15 MG tablet Take 15 mg by mouth daily. 04/03/24    [provider]  MOUNJARO  7.5 MG/0.5ML Pen Inject 7.5 mg into the skin every Friday.    [provider]  sertraline  (ZOLOFT ) 100 MG tablet Take 100 mg by mouth daily.    [provider]  traZODone  (DESYREL ) 50 MG tablet Take 50 mg by mouth at bedtime. 04/12/23   [provider]    Allergies: Bee venom, Wasp venom, Yellow jacket venom, and Codeine    Review of Systems  Cardiovascular:  Negative for chest pain.  Psychiatric/Behavioral:  Negative for hallucinations and suicidal ideas. The patient is not nervous/anxious and is not hyperactive.     Updated Vital Signs BP (!) 151/84   Pulse 93   Temp 98.8 F (37.1 C) (Oral)   Resp 19   SpO2 98%   Physical Exam Vitals and nursing note reviewed. Exam conducted with a chaperone present.  Constitutional:      General: He is not in acute distress.    Appearance: He is well-developed. He is not diaphoretic.  HENT:     Head: Normocephalic and atraumatic.     Nose: Nose normal.  Eyes:     Conjunctiva/sclera: Conjunctivae normal.     Pupils: Pupils are equal, round, and reactive to light.  Cardiovascular:     Rate and Rhythm: Regular rhythm. Tachycardia present.     Pulses: Normal pulses.     Heart sounds: Normal heart sounds.  Pulmonary:     Effort: Pulmonary effort is normal.  Breath sounds: Normal breath sounds. No wheezing or rales.  Abdominal:     General: Bowel sounds are normal.     Palpations: Abdomen is soft.     Tenderness: There is no abdominal tenderness. There is no guarding or rebound.  Musculoskeletal:        General: Normal range of motion.     Cervical back: Normal range of motion and neck supple.  Skin:    General: Skin is warm and dry.     Capillary Refill: Capillary refill takes less than 2 seconds.  Neurological:     General: No focal deficit present.     Mental Status: He is alert and oriented to person, place, and time.     Deep Tendon Reflexes: Reflexes normal.   Psychiatric:        Thought Content: Thought content normal.     (all labs ordered are listed, but only abnormal results are displayed) Results for orders placed or performed during the hospital encounter of 06/18/24  CBC WITH DIFFERENTIAL   Collection Time: 06/18/24 11:20 PM  Result Value Ref Range   WBC 9.4 4.0 - 10.5 K/uL   RBC 4.97 4.22 - 5.81 MIL/uL   Hemoglobin 15.3 13.0 - 17.0 g/dL   HCT 57.3 60.9 - 47.9 %   MCV 85.7 80.0 - 100.0 fL   MCH 30.8 26.0 - 34.0 pg   MCHC 35.9 30.0 - 36.0 g/dL   RDW 87.3 88.4 - 84.4 %   Platelets 435 (H) 150 - 400 K/uL   nRBC 0.0 0.0 - 0.2 %   Neutrophils Relative % 71 %   Neutro Abs 6.7 1.7 - 7.7 K/uL   Lymphocytes Relative 20 %   Lymphs Abs 1.9 0.7 - 4.0 K/uL   Monocytes Relative 7 %   Monocytes Absolute 0.7 0.1 - 1.0 K/uL   Eosinophils Relative 1 %   Eosinophils Absolute 0.1 0.0 - 0.5 K/uL   Basophils Relative 1 %   Basophils Absolute 0.1 0.0 - 0.1 K/uL   Immature Granulocytes 0 %   Abs Immature Granulocytes 0.03 0.00 - 0.07 K/uL  CBG monitoring, ED   Collection Time: 06/18/24 11:41 PM  Result Value Ref Range   Glucose-Capillary 134 (H) 70 - 99 mg/dL  Comprehensive metabolic panel with GFR   Collection Time: 06/19/24 12:09 AM  Result Value Ref Range   Sodium 139 135 - 145 mmol/L   Potassium 3.8 3.5 - 5.1 mmol/L   Chloride 104 98 - 111 mmol/L   CO2 25 22 - 32 mmol/L   Glucose, Bld 114 (H) 70 - 99 mg/dL   BUN 8 6 - 20 mg/dL   Creatinine, Ser 9.23 0.61 - 1.24 mg/dL   Calcium  9.4 8.9 - 10.3 mg/dL   Total Protein 7.5 6.5 - 8.1 g/dL   Albumin 4.3 3.5 - 5.0 g/dL   AST 18 15 - 41 U/L   ALT 22 0 - 44 U/L   Alkaline Phosphatase 96 38 - 126 U/L   Total Bilirubin 0.6 0.0 - 1.2 mg/dL   GFR, Estimated >39 >39 mL/min   Anion gap 11 5 - 15  Magnesium    Collection Time: 06/19/24 12:09 AM  Result Value Ref Range   Magnesium  2.1 1.7 - 2.4 mg/dL  Acetaminophen  level   Collection Time: 06/19/24 12:09 AM  Result Value Ref Range    Acetaminophen  (Tylenol ), Serum <10 (L) 10 - 30 ug/mL  Ethanol   Collection Time: 06/19/24 12:09 AM  Result Value Ref  Range   Alcohol, Ethyl (B) <15 <15 mg/dL  Salicylate level   Collection Time: 06/19/24 12:09 AM  Result Value Ref Range   Salicylate Lvl <7.0 (L) 7.0 - 30.0 mg/dL  Urine rapid drug screen (hosp performed)   Collection Time: 06/19/24  1:24 AM  Result Value Ref Range   Opiates NEGATIVE NEGATIVE   Cocaine NEGATIVE NEGATIVE   Benzodiazepines NEGATIVE NEGATIVE   Amphetamines NEGATIVE NEGATIVE   Tetrahydrocannabinol NEGATIVE NEGATIVE   Barbiturates NEGATIVE NEGATIVE   Methadone Scn, Ur NEGATIVE NEGATIVE   Fentanyl  POSITIVE (A) NEGATIVE   No results found.   EKG: EKG Interpretation Date/Time:  Thursday June 18 2024 23:41:15 EST Ventricular Rate:  98 PR Interval:  173 QRS Duration:  115 QT Interval:  378 QTC Calculation: 483 R Axis:   54  Text Interpretation: Sinus rhythm Confirmed by Levan Aloia ([id number]) on 06/18/2024 11:48:24 PM  Radiology: No results found.   Procedures   Medications Ordered in the ED  alum & mag hydroxide-simeth (MAALOX/MYLANTA) 200-200-20 MG/5ML suspension 30 mL (has no administration in time range)  nicotine  (NICODERM CQ  - dosed in mg/24 hours) patch 21 mg (has no administration in time range)  sodium chloride  0.9 % bolus 1,000 mL (1,000 mLs Intravenous New Bag/Given 06/18/24 2344)                                    Medical Decision Making Patient with Dextromethorphan use   Amount and/or Complexity of Data Reviewed Independent Historian: EMS    Details: See above  External Data Reviewed: notes.    Details: Previous notes reviewed.  Has taken this before  Labs: ordered.    Details: UDs negative except for fentanyl .  Etoh, tylenol  and salicylate negative.  Normal white count 9.4, normal hemoglobin 15.3, normal platelets.  Normal sodium 139, normal potassium 3.8, normal creatinine ECG/medicine tests: ordered and  independent interpretation performed. Decision-making details documented in ED Course.  Risk OTC drugs. Prescription drug management. Risk Details: Hydrated in the ED> QT is top normal but unchanged from previous.  Resting comfortably.       Final diagnoses:  Overdose of undetermined intent, initial encounter  The patient has been placed in psychiatric observation due to the need to provide a safe environment for the patient while obtaining psychiatric consultation and evaluation, as well as ongoing medical and medication management to treat the patient's condition.  The patient has not been placed under full IVC at this time.   ED Discharge Orders     None          Mana Morison, MD 06/19/24 [phone]

## 2024-06-19 NOTE — ED Notes (Signed)
 Pt icked up from safe transport. Unable to medicate at this time

## 2024-06-19 NOTE — Progress Notes (Signed)
   06/19/24 2040  Psych Admission Type (Psych Patients Only)  Admission Status Voluntary  Psychosocial Assessment  Patient Complaints Depression;Anxiety  Eye Contact Brief  Facial Expression Anxious  Affect Anxious;Appropriate to circumstance;Depressed  Speech Logical/coherent  Interaction Assertive  Motor Activity Other (Comment) (pt in bed resting)  Appearance/Hygiene In hospital gown  Behavior Characteristics Cooperative  Mood Pleasant;Anxious;Depressed  Thought Process  Coherency Circumstantial  Content WDL  Delusions None reported or observed  Perception WDL  Hallucination None reported or observed  Judgment Poor  Confusion None  Danger to Self  Current suicidal ideation? Denies

## 2024-06-19 NOTE — [facility] Group Notes (Signed)
 Adult Psychoeducational Group Note  Date:  06/19/2024 Time:  12:08 PM  Group Topic/Focus: Recreational Therapy   Participation Level:  Did not attend  Jason Wolf 06/19/2024, 12:08 PM

## 2024-06-19 NOTE — [facility] Group Notes (Signed)
 [facility] Group Notes:  (Nursing/MHT/Case Management/Adjunct)  Date:  06/19/2024  Time:  8:22 PM  Type of Therapy:  AA Group  Participation Level:  Did Not Attend  Participation Quality:    Affect:    Cognitive:    Insight:    Engagement in Group:    Modes of Intervention:    Summary of Progress/Problems:  Jason Wolf [place] 06/19/2024, 8:22 PM

## 2024-06-19 NOTE — ED Notes (Addendum)
 [facility] contacted. No answer. Will attempt to call again
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
## 2024-06-19 NOTE — [facility] Group Notes (Signed)
 Adult Psychoeducational Group Note  Date:  06/19/2024 Time:  10:55 AM  Group Topic/Focus: Goals Group   Participation Level:  Did Not Attend  Participation Jason Wolf 06/19/2024, 10:55 AM

## 2024-06-20 ENCOUNTER — Encounter (HOSPITAL_COMMUNITY): Payer: Self-pay | Admitting: Psychiatry

## 2024-06-20 LAB — GLUCOSE, CAPILLARY
Glucose-Capillary: 198 mg/dL — ABNORMAL HIGH (ref 70–99)
Glucose-Capillary: 277 mg/dL — ABNORMAL HIGH (ref 70–99)
Glucose-Capillary: 321 mg/dL — ABNORMAL HIGH (ref 70–99)
Glucose-Capillary: 336 mg/dL — ABNORMAL HIGH (ref 70–99)

## 2024-06-20 MED ORDER — SERTRALINE HCL 100 MG PO TABS
100.0000 mg | ORAL_TABLET | Freq: Every day | ORAL | Status: DC
  Administered 2024-06-21 – 2024-06-22 (×2): 100 mg via ORAL
  Filled 2024-06-20 (×2): qty 1

## 2024-06-20 NOTE — Group Note (Signed)
 Date:  06/20/2024 Time:  3:15 PM  Group Topic/Focus:  Patients created intuitive collages, supporting mindfulness and focus, using the creative process to organize and visually express complex feelings that may be hard to verbalize. The format encouraged interpersonal connection, personal sharing, and peer support, which can foster social engagement and enhance emotional safety.    Participation Level:  Did Not Attend   Berwyn GORMAN Acosta 06/20/2024, 5:58 PM

## 2024-06-20 NOTE — Progress Notes (Signed)
   06/20/24 2113  Psych Admission Type (Psych Patients Only)  Admission Status Voluntary  Psychosocial Assessment  Patient Complaints None  Eye Contact Brief  Facial Expression Animated  Affect Appropriate to circumstance  Speech Logical/coherent  Interaction Assertive  Motor Activity Fidgety  Appearance/Hygiene In hospital gown  Behavior Characteristics Appropriate to situation  Mood Pleasant  Thought Process  Coherency Circumstantial  Content WDL  Delusions None reported or observed  Perception WDL  Hallucination None reported or observed  Judgment Poor  Confusion None  Danger to Self  Current suicidal ideation? Denies  Agreement Not to Harm Self Yes  Description of Agreement verbal  Danger to Others  Danger to Others None reported or observed

## 2024-06-20 NOTE — Group Note (Signed)
 [facility] LCSW Group Therapy Note   Group Date: 06/20/2024 Start Time: 1000 End Time: 1100   Type of Therapy/Topic:  Group Therapy:  Emotion Regulation  Participation Level:  Did Not Attend   Mood:  Description of Group:    The purpose of this group is to assist patients in learning to regulate negative emotions and experience positive emotions. Patients will be guided to discuss ways in which they have been vulnerable to their negative emotions. These vulnerabilities will be juxtaposed with experiences of positive emotions or situations, and patients challenged to use positive emotions to combat negative ones. Special emphasis will be placed on coping with negative emotions in conflict situations, and patients will process healthy conflict resolution skills.  Therapeutic Goals: Patient will identify two positive emotions or experiences to reflect on in order to balance out negative emotions:  Patient will label two or more emotions that they find the most difficult to experience:  Patient will be able to demonstrate positive conflict resolution skills through discussion or role plays:   Summary of Patient Progress:   Pt was invited but did not attend    Therapeutic Modalities:   Cognitive Behavioral Therapy Feelings Identification Dialectical Behavioral Therapy   Golda Louder, LCSW

## 2024-06-20 NOTE — Plan of Care (Signed)
   Problem: Education: Goal: Knowledge of [facility] Education information/materials will improve Outcome: Progressing Goal: Emotional status will improve Outcome: Progressing Goal: Mental status will improve Outcome: Progressing Goal: Verbalization of understanding the information provided will improve Outcome: Progressing   Problem: Activity: Goal: Interest or engagement in activities will improve Outcome: Progressing

## 2024-06-20 NOTE — Plan of Care (Signed)
  Problem: Coping: Goal: Ability to demonstrate self-control will improve Outcome: Progressing
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
## 2024-06-20 NOTE — Group Note (Signed)
 Date:  06/20/2024 Time:  10:22 AM  Group Topic/Focus: Goals Group  Goals Group focused on self-reflection and future planning. The group began with an icebreaker in which members shared their favorite foods and identified personal strengths using a provided list of positive traits. Patients then discussed their short- and long-term goals, including 1-week, 30-month, 1-year, and 5-year objectives. Participants identified potential obstacles to achieving these goals and explored practical steps they could take if such barriers arise.   Participation Level:  Did Not Attend  Participation Quality:  N/A  Affect:  N/A  Cognitive:  N/A  Insight: None  Engagement in Group:  None  Modes of Intervention:  N/A  Additional Comments:  Edvardo did not attend goals group.  Kristi HERO Peter Keyworth 06/20/2024, 10:22 AM

## 2024-06-20 NOTE — Progress Notes (Signed)
 [facility] MD Progress Note  06/20/2024 1:54 PM Jason Wolf  MRN:  [id number] Subjective:   Jason Wolf is a 43 yr old male who presented on 11/21 to [facility] after a Suicide Attempt via OD (900 mg Dextromethorphan) and worsening depression in the setting of not taking his medication, he was admitted to [facility] on 11/21.  PPHx is significant for Depression, Anxiety, Polysubstance Abuse (Cough Med, Hx of Meth), Borderline Personality Disorder, and 5 Suicide Attempts (last OD- cough medicines 2023), Self Injurious Behavior (cutting), and 4 Prior Psychiatric Hospitalizations (last 10 yrs ago [place]).    Case was discussed in the multidisciplinary team. MAR was reviewed and patient was compliant with medications.  He received PRN Tylenol , Hydroxyzine , and Trazodone  yesterday.   Psychiatric Team made the following recommendations yesterday: -Restart Zoloft  at 50 mg daily for depression and anxiety     On interview today patient reports he slept good last night.  He reports his appetite is doing good.  He reports no SI, HI, or AVH.  He reports no Paranoia or Ideas of Reference.  He reports no issues with his medications.  He reports that he talked with his family last night and it was good.  He reports he has been doing some thinking and this has been helpful.  He reports using the technique his therapist taught him of his happy place called [place].  Discussed with him that we would further increase his Zoloft  tomorrow and he was agreeable.  He reports no other concerns at present.   Principal Problem: MDD (major depressive disorder), recurrent severe, without psychosis (HCC) Diagnosis: Principal Problem:   MDD (major depressive disorder), recurrent severe, without psychosis (HCC)  Total Time spent with patient:  I personally spent 35 minutes on the unit in direct patient care. The direct patient care time included face-to-face time with the patient, reviewing the patient's chart, communicating with other  professionals, and coordinating care.    Past Psychiatric History:  Depression, Anxiety, Polysubstance Abuse (Cough Med, Hx of Meth), Borderline Personality Disorder, and 5 Suicide Attempts (last OD- cough medicines 2023), Self Injurious Behavior (cutting), and 4 Prior Psychiatric Hospitalizations (last 10 yrs ago [place]).   Past Medical History:  Past Medical History:  Diagnosis Date   Bipolar disorder (HCC)    DM (diabetes mellitus) (HCC)    ETOH abuse    Hepatitis C    Hypertension    Opiate abuse, episodic (HCC)     Past Surgical History:  Procedure Laterality Date   I & D EXTREMITY Right 02/20/2020   Procedure: IRRIGATION AND DEBRIDEMENT HAND;  Surgeon: Carolee Lynwood JINNY DOUGLAS, MD;  Location: [facility] OR;  Service: Orthopedics;  Laterality: Right;   KNEE ARTHROSCOPY     Family History:  Family History  Problem Relation Age of Onset   Diabetes type II Mother    Diabetes Mother    Family Psychiatric  History:  No Known Diagnosis', Suicides, or Substance Abuse   Social History:  Social History   Substance and Sexual Activity  Alcohol Use No   Comment: in rehab     Social History   Substance and Sexual Activity  Drug Use Not Currently   Types: Methamphetamines   Comment: was in rehab and relapsed after 22 months. 02/19/20    Social History   Socioeconomic History   Marital status: Single    Spouse name: Not on file   Number of children: Not on file   Years of education: Not on file  Highest education level: Not on file  Occupational History   Not on file  Tobacco Use   Smoking status: Every Day    Current packs/day: 0.25    Average packs/day: 0.3 packs/day for 23.9 years (6.0 ttl pk-yrs)    Types: Cigarettes    Start date: 2002   Smokeless tobacco: Never  Vaping Use   Vaping status: Every Day  Substance and Sexual Activity   Alcohol use: No    Comment: in rehab   Drug use: Not Currently    Types: Methamphetamines    Comment: was in rehab and relapsed after 22  months. 02/19/20   Sexual activity: Not Currently  Other Topics Concern   Not on file  Social History Narrative   Not on file   Social Drivers of Health   Financial Resource Strain: Not on file  Food Insecurity: No Food Insecurity (06/19/2024)   Hunger Vital Sign    Worried About Running Out of Food in the Last Year: Never true    Ran Out of Food in the Last Year: Never true  Transportation Needs: No Transportation Needs (06/19/2024)   PRAPARE - Administrator, Civil Service (Medical): No    Lack of Transportation (Non-Medical): No  Physical Activity: Not on file  Stress: Not on file  Social Connections: Not on file   Additional Social History:                         Sleep: Good Estimated Sleeping Duration (Last 24 Hours): 6.00-7.00 hours (Due to Daylight Saving Time, the durations displayed may not accurately represent documentation during the time change interval)  Appetite:  Good  Current Medications: Current Facility-Administered Medications  Medication Dose Route Frequency Provider Last Rate Last Admin   acetaminophen  (TYLENOL ) tablet 650 mg  650 mg Oral Q6H PRN Starkes-Perry, [name], FNP   650 mg at 06/19/24 1153   alum & mag hydroxide-simeth (MAALOX/MYLANTA) 200-200-20 MG/5ML suspension 30 mL  30 mL Oral Q4H PRN Starkes-Perry, [name], FNP       haloperidol  (HALDOL ) tablet 5 mg  5 mg Oral TID PRN Wilkie [name], FNP       And   diphenhydrAMINE  (BENADRYL ) capsule 50 mg  50 mg Oral TID PRN Starkes-Perry, [name], FNP       haloperidol  lactate (HALDOL ) injection 5 mg  5 mg Intramuscular TID PRN Starkes-Perry, [name], FNP       And   diphenhydrAMINE  (BENADRYL ) injection 50 mg  50 mg Intramuscular TID PRN Starkes-Perry, [name], FNP       And   LORazepam  (ATIVAN ) injection 2 mg  2 mg Intramuscular TID PRN Starkes-Perry, [name], FNP       haloperidol  lactate (HALDOL ) injection 10 mg  10 mg Intramuscular TID PRN Wilkie [name], FNP        And   diphenhydrAMINE  (BENADRYL ) injection 50 mg  50 mg Intramuscular TID PRN Starkes-Perry, [name], FNP       And   LORazepam  (ATIVAN ) injection 2 mg  2 mg Intramuscular TID PRN Wilkie [name], FNP       hydrOXYzine  (ATARAX ) tablet 25 mg  25 mg Oral TID PRN Damondre Pfeifle S, DO   25 mg at 06/19/24 2129   insulin  aspart (novoLOG ) injection 0-15 Units  0-15 Units Subcutaneous TID WC Keondre Markson S, DO   8 Units at 06/20/24 1209   insulin  aspart (novoLOG ) injection 0-5  Units  0-5 Units Subcutaneous QHS Cola Highfill S, DO   4 Units at 06/19/24 2130   losartan  (COZAAR ) tablet 50 mg  50 mg Oral Daily Starkes-Perry, [name], FNP   50 mg at 06/20/24 0835   magnesium  hydroxide (MILK OF MAGNESIA) suspension 30 mL  30 mL Oral Daily PRN Wilkie [name], FNP       [START ON 06/21/2024] sertraline  (ZOLOFT ) tablet 100 mg  100 mg Oral Daily Sharene Krikorian S, DO       traZODone  (DESYREL ) tablet 100 mg  100 mg Oral QHS PRN Starkes-Perry, [name], FNP   100 mg at 06/19/24 2128    Lab Results:  Results for orders placed or performed during the hospital encounter of 06/19/24 (from the past 48 hours)  Glucose, capillary     Status: Abnormal   Collection Time: 06/19/24  4:58 PM  Result Value Ref Range   Glucose-Capillary 204 (H) 70 - 99 mg/dL    Comment: Glucose reference range applies only to samples taken after fasting for at least 8 hours.  Glucose, capillary     Status: Abnormal   Collection Time: 06/19/24  8:42 PM  Result Value Ref Range   Glucose-Capillary 319 (H) 70 - 99 mg/dL    Comment: Glucose reference range applies only to samples taken after fasting for at least 8 hours.  Glucose, capillary     Status: Abnormal   Collection Time: 06/20/24  6:17 AM  Result Value Ref Range   Glucose-Capillary 198 (H) 70 - 99 mg/dL    Comment: Glucose reference range applies only to samples taken after fasting for at least 8 hours.  Glucose, capillary     Status:  Abnormal   Collection Time: 06/20/24 11:31 AM  Result Value Ref Range   Glucose-Capillary 277 (H) 70 - 99 mg/dL    Comment: Glucose reference range applies only to samples taken after fasting for at least 8 hours.    Blood Alcohol level:  Lab Results  Component Value Date   [facility] <15 06/19/2024   ETH <15 05/07/2024    Metabolic Disorder Labs: Lab Results  Component Value Date   HGBA1C 7.0 (H) 04/10/2024   MPG 154.2 04/10/2024   MPG 240.3 04/30/2023   No results found for: PROLACTIN Lab Results  Component Value Date   CHOL 289 (H) 04/14/2021   TRIG 891 (H) 04/14/2021   HDL 38 (L) 04/14/2021   CHOLHDL 7.6 04/14/2021   VLDL UNABLE TO CALCULATE IF TRIGLYCERIDE OVER 400 mg/dL 90/83/7977   LDLCALC UNABLE TO CALCULATE IF TRIGLYCERIDE OVER 400 mg/dL 90/83/7977   LDLCALC 864 (H) 06/29/2020    Physical Findings: AIMS:  ,  ,  ,  ,  ,  ,   CIWA:    COWS:     Musculoskeletal: Strength & Muscle Tone: within normal limits Gait & Station: normal Patient leans: N/A  Psychiatric Specialty Exam:  Presentation  General Appearance:  Casual  Eye Contact: Good  Speech: Clear and Coherent; Normal Rate  Speech Volume: Normal  Handedness: Right   Mood and Affect  Mood: Dysphoric  Affect: Congruent   Thought Process  Thought Processes: Coherent; Goal Directed  Descriptions of Associations:Intact  Orientation:Full (Time, Place and Person)  Thought Content:Logical; WDL  History of Schizophrenia/Schizoaffective disorder:No  Duration of Psychotic Symptoms:No data recorded Hallucinations:Hallucinations: None  Ideas of Reference:None  Suicidal Thoughts:Suicidal Thoughts: No  Homicidal Thoughts:Homicidal Thoughts: No   Sensorium  Memory: Immediate Fair; Recent Fair  Judgment: Fair  Insight: Present (improving)   Executive Functions  Concentration: Fair  Attention Span: Fair  Recall: Fair  Fund of  Knowledge: Good  Language: Good   Psychomotor Activity  Psychomotor Activity: Psychomotor Activity: Normal   Assets  Assets: Communication Skills; Desire for Improvement; Resilience; Housing; Social Support   Sleep  Sleep: Sleep: Good    Physical Exam: Physical Exam Vitals and nursing note reviewed.  Constitutional:      General: He is not in acute distress.    Appearance: Normal appearance. He is normal weight. He is not ill-appearing or toxic-appearing.  HENT:     Head: Normocephalic and atraumatic.  Pulmonary:     Effort: Pulmonary effort is normal.  Musculoskeletal:        General: Normal range of motion.  Neurological:     General: No focal deficit present.     Mental Status: He is alert.    Review of Systems  Respiratory:  Negative for cough and shortness of breath.   Cardiovascular:  Negative for chest pain.  Gastrointestinal:  Negative for abdominal pain, constipation, diarrhea, nausea and vomiting.  Neurological:  Negative for dizziness, weakness and headaches.  Psychiatric/Behavioral:  Positive for depression. Negative for hallucinations and suicidal ideas. The patient is nervous/anxious.    Blood pressure (!) 142/85, pulse 89, temperature 98 F (36.7 C), temperature source Oral, resp. rate 16, height 5' 11 (1.803 m), weight 99.8 kg, SpO2 99%. Body mass index is 30.68 kg/m.   Treatment Plan Summary: Daily contact with patient to assess and evaluate symptoms and progress in treatment and Medication management   Seichi Kaufhold is a 43 yr old male who presented on 11/21 to [facility] after a Suicide Attempt via OD (900 mg Dextromethorphan) and worsening depression in the setting of not taking his medication, he was admitted to [facility] on 11/21.  PPHx is significant for Depression, Anxiety, Polysubstance Abuse (Cough Med, Hx of Meth), Borderline Personality Disorder, and 5 Suicide Attempts (last OD- cough medicines 2023), Self Injurious Behavior (cutting), and 4  Prior Psychiatric Hospitalizations (last 10 yrs ago [place]).      Jakeim tolerated restarting Zoloft .  He has been working on processing his feelings and interacting well on the unit with others.  We will plan to further increase his Zoloft  tomorrow and will most likely further increase it after this.  We will not make any changes to his medications at this time.  We will continue to monitor.      MDD, Recurrent, Severe, w/out Psychyosis  Anxiety: -Continue Zoloft  at 50 mg daily for depression and anxiety -Continue Agitation Protocol: Haldol /Ativan /Benadryl      HTN: -Continue Losartan  50 mg daily     Diabetes: -Continue Sliding Scale     -Continue PRN's: Tylenol , Maalox, Atarax , Milk of Magnesia, Trazodone    --  The risks/benefits/side-effects/alternatives to medications were discussed in detail with the patient and time was given for questions. The patient consents to medication trials.               -- Encouraged patient to participate in unit milieu and in scheduled group therapies              -- Short Term Goals: Ability to identify changes in lifestyle to reduce recurrence of condition will improve, Ability to verbalize feelings will improve, Ability to disclose and discuss suicidal ideas, Ability to demonstrate self-control will improve, Ability to identify and develop effective coping behaviors will improve, Ability to maintain clinical measurements within normal limits will improve,  Compliance with prescribed medications will improve, and Ability to identify triggers associated with substance abuse/mental health issues will improve             -- Long Term Goals: Improvement in symptoms so as ready for discharge   Safety and Monitoring:             -- Voluntary admission to inpatient psychiatric unit for safety, stabilization and treatment             -- Daily contact with patient to assess and evaluate symptoms and progress in treatment             -- Patient's case to be  discussed in multi-disciplinary team meeting             -- Observation Level : q15 minute checks             -- Vital signs:  q12 hours             -- Precautions: suicide, elopement, and assault  Discharge Planning:              -- Social work and case management to assist with discharge planning and identification of hospital follow-up needs prior to discharge             -- Estimated LOS: 3-5 more days             -- Discharge Concerns: Need to establish a safety plan; Medication compliance and effectiveness             -- Discharge Goals: Return home with outpatient referrals for mental health follow-up including medication management/psychotherapy  Jason GORMAN Rosser, DO 06/20/2024, 1:54 PM

## 2024-06-20 NOTE — Group Note (Signed)
 Date:  06/20/2024 Time:  2:21 PM  Group Topic/Focus: Karaoke  Patients participated in a karaoke-based therapeutic recreation group, singing both individually and together. The activity promoted peer support, confidence-building, social engagement, and positive group cohesion, with participants encouraging one another throughout the session.    Participation Level:  Did Not Attend  Participation Quality:  N/A  Affect:  N/A  Cognitive:  N/A  Insight: None  Engagement in Group:  None  Modes of Intervention:  N/A  Additional Comments:  Furious did not attend karaoke.  Kristi HERO Chelsie Burel 06/20/2024, 2:21 PM

## 2024-06-20 NOTE — [facility] Group Notes (Signed)
 Jason Wolf did not attend the social work group today 06/20/24 (1000-1100).

## 2024-06-20 NOTE — Group Note (Signed)
 Date:  06/20/2024 Time:  2:10 PM  Group Topic/Focus: Physical Wellness  Patients learned the mechanisms and benefits of deep-breathing techniques and practiced guided breathing exercises. The group engaged in stretching routines and followed along with music-based movement activities to encourage circulation and promote overall physical activation. The session concluded with participants seated and performing chair-based yoga to support relaxation and gentle mobility    Participation Level:  Did Not Attend  Participation Quality:  N/A  Affect:  N/A  Cognitive:  N/A  Insight: None  Engagement in Group:  None  Modes of Intervention:  N/A  Additional Comments:  Jason Wolf did not attend this physical wellness group.  Jason Wolf 06/20/2024, 2:10 PM

## 2024-06-20 NOTE — Group Note (Signed)
 Date:  06/20/2024 Time:  10:36 AM  Group Topic/Focus: Social Wellness  Patients participated in a Social Wellness Group focused on themes of control and shared experience. The group engaged in a collaborative drawing activity in which they added to each other's drawings every two minutes and later shared the final results. Discussion centered on the feelings associated with having control, losing control, and sharing control. Patients were educated on the normalcy of experiencing discomfort when control is lost, and the group explored ways to reframe these experiences positively, using the activity as an example. Patients were engaged and demonstrated insight during the discussion.    Participation Level:  Did Not Attend  Participation Quality:  N/A  Affect:  N/A  Cognitive:  N/A  Insight: None  Engagement in Group:  None  Modes of Intervention:  N/A  Additional Comments:  Jason Wolf did not attend this social wellness group.  [name] 06/20/2024, 10:36 AM

## 2024-06-20 NOTE — Progress Notes (Signed)
(  Sleep Hours) - 7.5 (Any PRNs that were needed, meds refused, or side effects to meds)- vistaril, trazodone (Any disturbances and when (visitation, over night)- none (Concerns raised by the patient)- none (SI/HI/AVH)- denies all

## 2024-06-20 NOTE — Progress Notes (Signed)
 Patient denies SI, HI, AVH. Patient stated they slept Good last night. Scored 5/10 on anxiety and depression. Patient has been calm, cooperative, and med compliant.       06/20/24 0800  Psych Admission Type (Psych Patients Only)  Admission Status Voluntary  Psychosocial Assessment  Patient Complaints Anxiety;Depression  Eye Contact Brief  Facial Expression Anxious;Worried  Affect Appropriate to circumstance  Furniture Conservator/restorer  Appearance/Hygiene In hospital gown  Behavior Characteristics Cooperative  Mood Anxious;Pleasant  Thought Process  Coherency Circumstantial  Content WDL  Delusions None reported or observed  Perception WDL  Hallucination None reported or observed  Judgment Poor  Confusion None  Danger to Self  Current suicidal ideation? Denies  Agreement Not to Harm Self Yes  Description of Agreement Verbal  Danger to Others  Danger to Others None reported or observed

## 2024-06-21 LAB — GLUCOSE, CAPILLARY
Glucose-Capillary: 259 mg/dL — ABNORMAL HIGH (ref 70–99)
Glucose-Capillary: 350 mg/dL — ABNORMAL HIGH (ref 70–99)
Glucose-Capillary: 345 mg/dL — ABNORMAL HIGH (ref 70–99)
Glucose-Capillary: 256 mg/dL — ABNORMAL HIGH (ref 70–99)
Glucose-Capillary: 368 mg/dL — ABNORMAL HIGH (ref 70–99)

## 2024-06-21 NOTE — Plan of Care (Signed)
   Problem: Education: Goal: Emotional status will improve Outcome: Progressing Goal: Mental status will improve Outcome: Progressing

## 2024-06-21 NOTE — Progress Notes (Signed)
   06/21/24 1951  Psych Admission Type (Psych Patients Only)  Admission Status Voluntary  Psychosocial Assessment  Patient Complaints None  Eye Contact Fair  Facial Expression Animated  Affect Appropriate to circumstance  Speech Logical/coherent  Interaction Assertive  Motor Activity Slow  Appearance/Hygiene In hospital gown  Behavior Characteristics Cooperative;Appropriate to situation  Mood Pleasant  Thought Process  Coherency WDL  Content WDL  Delusions None reported or observed  Perception WDL  Hallucination None reported or observed  Judgment WDL  Confusion None  Danger to Self  Current suicidal ideation? Denies

## 2024-06-21 NOTE — Group Note (Signed)
 Date:  06/21/2024 Time:  8:44 PM  Group Topic/Focus:  Addiction - Ted Talk on Addiction/[place]    Participation Level:  Did Not Attend Jason Wolf 06/21/2024, 8:44 PM

## 2024-06-21 NOTE — Group Note (Signed)
 Date:  06/21/2024 Time:  10:22 AM  Group Topic/Focus:  Emotional Wellness: Focused on developing emotional awareness and intelligence by exploring the Feelings Wheel and reflecting on insights from a TEDx talk about emotional well-being. Our aim was to deepen our understanding of emotions, improve our ability to identify and express feelings, and explore practical strategies for managing emotions in a healthy way.  Participation Level:  Did Not Attend  Jason Wolf 06/21/2024, 10:22 AM

## 2024-06-21 NOTE — [facility] Group Notes (Signed)
Naif did not attend wrap up group

## 2024-06-21 NOTE — Group Note (Signed)
 Date:  06/21/2024 Time:  10:09 AM  Group Topic/Focus:  Goals Group:  Participants assessed their current level of intellectual wellness, identify areas for growth, and create personalized, achievable goals that foster lifelong learning, critical thinking, creativity, and engagement with thought-provoking activities. By the end of the session, each participant will have a clear, actionable SMART goal aimed at enhancing their intellectual well-being, with a plan for ongoing reflection and accountability to ensure progress.  Participation Level:  Did Not Attend   Jason Wolf 06/21/2024, 10:09 AM

## 2024-06-21 NOTE — Plan of Care (Signed)
 Pt was out in the milieu during the day acting appropriately. Went to [employer] and ate adequately. Attending group activity and participated. Denies SI/HI/SH/paranoia/AVH. Will continue to monitor.

## 2024-06-21 NOTE — Group Note (Signed)
 Date:  06/21/2024 Time:  11:54 AM  Group Topic/Focus:  Physical Wellness: Develop a deeper sense of awareness and connection with their bodies through guided meditation and grounding techniques. By engaging in these practices, the goal is to reduce stress, promote relaxation, and enhance overall well-being. Participants will learn tools they can incorporate into their daily lives to manage physical tension, emotional stress, and increase mindfulness. The session aims to foster a supportive environment where individuals can share experiences, gain confidence in using mindfulness techniques, and improve their ability to remain grounded in the present moment.  Participation Level:  Did Not Attend  Jason Wolf 06/21/2024, 11:54 AM

## 2024-06-21 NOTE — Progress Notes (Signed)
 [facility] MD Progress Note  06/21/2024 9:58 AM Jason Wolf  MRN:  [id number] Subjective:   Jason Wolf is a 43 yr old male who presented on 11/21 to [facility] after a Suicide Attempt via OD (900 mg Dextromethorphan) and worsening depression in the setting of not taking his medication, he was admitted to [facility] on 11/21.  PPHx is significant for Depression, Anxiety, Polysubstance Abuse (Cough Med, Hx of Meth), Borderline Personality Disorder, and 5 Suicide Attempts (last OD- cough medicines 2023), Self Injurious Behavior (cutting), and 4 Prior Psychiatric Hospitalizations (last 10 yrs ago [place]).    Case was discussed in the multidisciplinary team. MAR was reviewed and patient was compliant with medications.  He received PRN Hydroxyzine  and Trazodone  yesterday.   Psychiatric Team made the following recommendations yesterday: -Continue Zoloft  50 mg daily for depression and anxiety     On interview today patient reports he slept good last night.  He reports his appetite is doing good.  He reports no SI, HI, or AVH.  He reports no Paranoia or Ideas of Reference.  He reports no issues with his medications.  He reports that he has been getting to know other patients in the milieu and this has been going well.  He reports he again spoke with his parents last night has been helpful.  Encouraged him to continue working on his coping skills.  Discussed we would proceed with the planned increase in his Zoloft  today and he is agreeable with this.  He reports no concerns at present.    Principal Problem: MDD (major depressive disorder), recurrent severe, without psychosis (HCC) Diagnosis: Principal Problem:   MDD (major depressive disorder), recurrent severe, without psychosis (HCC)  Total Time spent with patient:  I personally spent 35 minutes on the unit in direct patient care. The direct patient care time included face-to-face time with the patient, reviewing the patient'Wolf chart, communicating with other  professionals, and coordinating care.    Past Psychiatric History:  Depression, Anxiety, Polysubstance Abuse (Cough Med, Hx of Meth), Borderline Personality Disorder, and 5 Suicide Attempts (last OD- cough medicines 2023), Self Injurious Behavior (cutting), and 4 Prior Psychiatric Hospitalizations (last 10 yrs ago [place]).   Past Medical History:  Past Medical History:  Diagnosis Date   Bipolar disorder (HCC)    DM (diabetes mellitus) (HCC)    ETOH abuse    Hepatitis C    Hypertension    Opiate abuse, episodic (HCC)     Past Surgical History:  Procedure Laterality Date   I & D EXTREMITY Right 02/20/2020   Procedure: IRRIGATION AND DEBRIDEMENT HAND;  Surgeon: Carolee Lynwood JINNY DOUGLAS, MD;  Location: [facility] OR;  Service: Orthopedics;  Laterality: Right;   KNEE ARTHROSCOPY     Family History:  Family History  Problem Relation Age of Onset   Diabetes type II Mother    Diabetes Mother    Family Psychiatric  History:  No Known Diagnosis', Suicides, or Substance Abuse   Social History:  Social History   Substance and Sexual Activity  Alcohol Use No   Comment: in rehab     Social History   Substance and Sexual Activity  Drug Use Not Currently   Types: Methamphetamines   Comment: was in rehab and relapsed after 22 months. 02/19/20    Social History   Socioeconomic History   Marital status: Single    Spouse name: Not on file   Number of children: Not on file   Years of education: Not on file  Highest education level: Not on file  Occupational History   Not on file  Tobacco Use   Smoking status: Every Day    Current packs/day: 0.25    Average packs/day: 0.3 packs/day for 23.9 years (6.0 ttl pk-yrs)    Types: Cigarettes    Start date: 2002   Smokeless tobacco: Never  Vaping Use   Vaping status: Every Day  Substance and Sexual Activity   Alcohol use: No    Comment: in rehab   Drug use: Not Currently    Types: Methamphetamines    Comment: was in rehab and relapsed after 22  months. 02/19/20   Sexual activity: Not Currently  Other Topics Concern   Not on file  Social History Narrative   Not on file   Social Drivers of Health   Financial Resource Strain: Not on file  Food Insecurity: No Food Insecurity (06/19/2024)   Hunger Vital Sign    Worried About Running Out of Food in the Last Year: Never true    Ran Out of Food in the Last Year: Never true  Transportation Needs: No Transportation Needs (06/19/2024)   PRAPARE - Administrator, Civil Service (Medical): No    Lack of Transportation (Non-Medical): No  Physical Activity: Not on file  Stress: Not on file  Social Connections: Not on file   Additional Social History:                         Sleep: Good Estimated Sleeping Duration (Last 24 Hours): 8.50-9.00 hours (Due to Daylight Saving Time, the durations displayed may not accurately represent documentation during the time change interval)  Appetite:  Good  Current Medications: Current Facility-Administered Medications  Medication Dose Route Frequency Provider Last Rate Last Admin   acetaminophen  (TYLENOL ) tablet 650 mg  650 mg Oral Q6H PRN Starkes-Perry, [name], FNP   650 mg at 06/19/24 1153   alum & mag hydroxide-simeth (MAALOX/MYLANTA) 200-200-20 MG/5ML suspension 30 mL  30 mL Oral Q4H PRN Starkes-Perry, [name], FNP       haloperidol  (HALDOL ) tablet 5 mg  5 mg Oral TID PRN Wilkie [name], FNP       And   diphenhydrAMINE  (BENADRYL ) capsule 50 mg  50 mg Oral TID PRN Starkes-Perry, [name], FNP       haloperidol  lactate (HALDOL ) injection 5 mg  5 mg Intramuscular TID PRN Starkes-Perry, [name], FNP       And   diphenhydrAMINE  (BENADRYL ) injection 50 mg  50 mg Intramuscular TID PRN Starkes-Perry, [name], FNP       And   LORazepam  (ATIVAN ) injection 2 mg  2 mg Intramuscular TID PRN Starkes-Perry, [name], FNP       haloperidol  lactate (HALDOL ) injection 10 mg  10 mg Intramuscular TID PRN Wilkie [name], FNP        And   diphenhydrAMINE  (BENADRYL ) injection 50 mg  50 mg Intramuscular TID PRN Starkes-Perry, [name], FNP       And   LORazepam  (ATIVAN ) injection 2 mg  2 mg Intramuscular TID PRN Wilkie [name], FNP       hydrOXYzine  (ATARAX ) tablet 25 mg  25 mg Oral TID PRN Jason Herrada S, DO   25 mg at 06/20/24 2104   insulin  aspart (novoLOG ) injection 0-15 Units  0-15 Units Subcutaneous TID WC Jason Jason RAMAN, DO   8 Units at 06/21/24 9372   insulin  aspart (novoLOG ) injection 0-5  Units  0-5 Units Subcutaneous QHS Jason Lizama S, DO   4 Units at 06/20/24 2103   losartan  (COZAAR ) tablet 50 mg  50 mg Oral Daily Starkes-Perry, [name], FNP   50 mg at 06/21/24 9182   magnesium  hydroxide (MILK OF MAGNESIA) suspension 30 mL  30 mL Oral Daily PRN Starkes-Perry, [name], FNP       sertraline  (ZOLOFT ) tablet 100 mg  100 mg Oral Daily Jason Godshall S, DO   100 mg at 06/21/24 9182   traZODone  (DESYREL ) tablet 100 mg  100 mg Oral QHS PRN Starkes-Perry, [name], FNP   100 mg at 06/20/24 2104    Lab Results:  Results for orders placed or performed during the hospital encounter of 06/19/24 (from the past 48 hours)  Glucose, capillary     Status: Abnormal   Collection Time: 06/19/24  4:58 PM  Result Value Ref Range   Glucose-Capillary 204 (H) 70 - 99 mg/dL    Comment: Glucose reference range applies only to samples taken after fasting for at least 8 hours.  Glucose, capillary     Status: Abnormal   Collection Time: 06/19/24  8:42 PM  Result Value Ref Range   Glucose-Capillary 319 (H) 70 - 99 mg/dL    Comment: Glucose reference range applies only to samples taken after fasting for at least 8 hours.  Glucose, capillary     Status: Abnormal   Collection Time: 06/20/24  6:17 AM  Result Value Ref Range   Glucose-Capillary 198 (H) 70 - 99 mg/dL    Comment: Glucose reference range applies only to samples taken after fasting for at least 8 hours.  Glucose, capillary     Status:  Abnormal   Collection Time: 06/20/24 11:31 AM  Result Value Ref Range   Glucose-Capillary 277 (H) 70 - 99 mg/dL    Comment: Glucose reference range applies only to samples taken after fasting for at least 8 hours.  Glucose, capillary     Status: Abnormal   Collection Time: 06/20/24  4:59 PM  Result Value Ref Range   Glucose-Capillary 321 (H) 70 - 99 mg/dL    Comment: Glucose reference range applies only to samples taken after fasting for at least 8 hours.  Glucose, capillary     Status: Abnormal   Collection Time: 06/20/24  8:21 PM  Result Value Ref Range   Glucose-Capillary 336 (H) 70 - 99 mg/dL    Comment: Glucose reference range applies only to samples taken after fasting for at least 8 hours.   Comment 1 Notify RN    Comment 2 Document in Chart   Glucose, capillary     Status: Abnormal   Collection Time: 06/21/24  5:59 AM  Result Value Ref Range   Glucose-Capillary 259 (H) 70 - 99 mg/dL    Comment: Glucose reference range applies only to samples taken after fasting for at least 8 hours.  Glucose, capillary     Status: Abnormal   Collection Time: 06/21/24  8:22 AM  Result Value Ref Range   Glucose-Capillary 350 (H) 70 - 99 mg/dL    Comment: Glucose reference range applies only to samples taken after fasting for at least 8 hours.    Blood Alcohol level:  Lab Results  Component Value Date   [facility] <15 06/19/2024   ETH <15 05/07/2024    Metabolic Disorder Labs: Lab Results  Component Value Date   HGBA1C 7.0 (H) 04/10/2024   MPG 154.2 04/10/2024   MPG 240.3 04/30/2023  No results found for: PROLACTIN Lab Results  Component Value Date   CHOL 289 (H) 04/14/2021   TRIG 891 (H) 04/14/2021   HDL 38 (L) 04/14/2021   CHOLHDL 7.6 04/14/2021   VLDL UNABLE TO CALCULATE IF TRIGLYCERIDE OVER 400 mg/dL 90/83/7977   LDLCALC UNABLE TO CALCULATE IF TRIGLYCERIDE OVER 400 mg/dL 90/83/7977   LDLCALC 864 (H) 06/29/2020    Physical Findings: AIMS:  ,  ,  ,  ,  ,  ,   CIWA:     COWS:     Musculoskeletal: Strength & Muscle Tone: within normal limits Gait & Station: normal Patient leans: N/A  Psychiatric Specialty Exam:  Presentation  General Appearance:  Casual  Eye Contact: Good  Speech: Clear and Coherent; Normal Rate  Speech Volume: Normal  Handedness: Right   Mood and Affect  Mood: Dysphoric  Affect: Congruent   Thought Process  Thought Processes: Coherent; Goal Directed  Descriptions of Associations:Intact  Orientation:Full (Time, Place and Person)  Thought Content:Logical; WDL  History of Schizophrenia/Schizoaffective disorder:No  Duration of Psychotic Symptoms:No data recorded Hallucinations:Hallucinations: None  Ideas of Reference:None  Suicidal Thoughts:Suicidal Thoughts: No  Homicidal Thoughts:Homicidal Thoughts: No   Sensorium  Memory: Immediate Fair; Recent Fair  Judgment: Fair  Insight: Fair   Art Therapist  Concentration: Fair  Attention Span: Fair  Recall: Fair  Fund of Knowledge: Good  Language: Good   Psychomotor Activity  Psychomotor Activity: Psychomotor Activity: Normal   Assets  Assets: Communication Skills; Desire for Improvement; Resilience; Social Support; Housing   Sleep  Sleep: Sleep: Good    Physical Exam: Physical Exam Vitals and nursing note reviewed.  Constitutional:      General: He is not in acute distress.    Appearance: Normal appearance. He is normal weight. He is not ill-appearing or toxic-appearing.  HENT:     Head: Normocephalic and atraumatic.  Pulmonary:     Effort: Pulmonary effort is normal.  Musculoskeletal:        General: Normal range of motion.  Neurological:     General: No focal deficit present.     Mental Status: He is alert.    Review of Systems  Respiratory:  Negative for cough and shortness of breath.   Cardiovascular:  Negative for chest pain.  Gastrointestinal:  Negative for abdominal pain, constipation,  diarrhea, nausea and vomiting.  Neurological:  Negative for dizziness, weakness and headaches.  Psychiatric/Behavioral:  Positive for depression (improving). Negative for hallucinations and suicidal ideas. The patient is not nervous/anxious.    Blood pressure (!) 131/96, pulse 97, temperature 98.1 F (36.7 C), temperature source Oral, resp. rate 20, height 5' 11 (1.803 m), weight 99.8 kg, SpO2 100%. Body mass index is 30.68 kg/m.   Treatment Plan Summary: Daily contact with patient to assess and evaluate symptoms and progress in treatment and Medication management   Jason Wolf is a 43 yr old male who presented on 11/21 to [facility] after a Suicide Attempt via OD (900 mg Dextromethorphan) and worsening depression in the setting of not taking his medication, he was admitted to [facility] on 11/21.  PPHx is significant for Depression, Anxiety, Polysubstance Abuse (Cough Med, Hx of Meth), Borderline Personality Disorder, and 5 Suicide Attempts (last OD- cough medicines 2023), Self Injurious Behavior (cutting), and 4 Prior Psychiatric Hospitalizations (last 10 yrs ago [place]).      Jason Wolf has tolerated the restarting of his Zoloft  so we will continue with the planned increase of his Zoloft .  He has been interacting well  in the milieu.  We will continue to monitor.      MDD, Recurrent, Severe, w/out Psychyosis  Anxiety: -Increase Zoloft  to 100 mg daily for depression and anxiety -Continue Agitation Protocol: Haldol /Ativan /Benadryl      HTN: -Continue Losartan  50 mg daily     Diabetes: -Continue Sliding Scale     -Continue PRN'Wolf: Tylenol , Maalox, Atarax , Milk of Magnesia, Trazodone    --  The risks/benefits/side-effects/alternatives to medications were discussed in detail with the patient and time was given for questions. The patient consents to medication trials.               -- Encouraged patient to participate in unit milieu and in scheduled group therapies              -- Short Term Goals:  Ability to identify changes in lifestyle to reduce recurrence of condition will improve, Ability to verbalize feelings will improve, Ability to disclose and discuss suicidal ideas, Ability to demonstrate self-control will improve, Ability to identify and develop effective coping behaviors will improve, Ability to maintain clinical measurements within normal limits will improve, Compliance with prescribed medications will improve, and Ability to identify triggers associated with substance abuse/mental health issues will improve             -- Long Term Goals: Improvement in symptoms so as ready for discharge   Safety and Monitoring:             -- Voluntary admission to inpatient psychiatric unit for safety, stabilization and treatment             -- Daily contact with patient to assess and evaluate symptoms and progress in treatment             -- Patient'Wolf case to be discussed in multi-disciplinary team meeting             -- Observation Level : q15 minute checks             -- Vital signs:  q12 hours             -- Precautions: suicide, elopement, and assault  Discharge Planning:              -- Social work and case management to assist with discharge planning and identification of hospital follow-up needs prior to discharge             -- Estimated LOS: 2-4 more days             -- Discharge Concerns: Need to establish a safety plan; Medication compliance and effectiveness             -- Discharge Goals: Return home with outpatient referrals for mental health follow-up including medication management/psychotherapy  Jason GORMAN Rosser, DO 06/21/2024, 9:58 AM

## 2024-06-21 NOTE — Plan of Care (Signed)
  Problem: Education: Goal: Emotional status will improve Outcome: Progressing   Problem: Education: Goal: Verbalization of understanding the information provided will improve Outcome: Progressing

## 2024-06-21 NOTE — Progress Notes (Signed)
(  Sleep Hours) - 9.75 hours (Any PRNs that were needed, meds refused, or side effects to meds)-  Vistaril  and Trazodone  given (Any disturbances and when (visitation, over night)- None (Concerns raised by the patient)- None (SI/HI/AVH)-  Denies

## 2024-06-21 NOTE — Progress Notes (Signed)
(  Sleep Hours) - (Any PRNs that were needed, meds refused, or side effects to meds)- trazodone , vistaril  (Any disturbances and when (visitation, over night)-none (Concerns raised by the patient)- none (SI/HI/AVH)- denies all

## 2024-06-22 ENCOUNTER — Encounter (HOSPITAL_COMMUNITY): Payer: Self-pay

## 2024-06-22 LAB — GLUCOSE, CAPILLARY
Glucose-Capillary: 238 mg/dL — ABNORMAL HIGH (ref 70–99)
Glucose-Capillary: 298 mg/dL — ABNORMAL HIGH (ref 70–99)
Glucose-Capillary: 380 mg/dL — ABNORMAL HIGH (ref 70–99)
Glucose-Capillary: 223 mg/dL — ABNORMAL HIGH (ref 70–99)

## 2024-06-22 MED ORDER — SERTRALINE HCL 50 MG PO TABS
150.0000 mg | ORAL_TABLET | Freq: Every day | ORAL | Status: DC
  Administered 2024-06-23 – 2024-06-24 (×2): 150 mg via ORAL
  Filled 2024-06-22 (×2): qty 1

## 2024-06-22 NOTE — Group Note (Signed)
 Date:  06/22/2024 Time:  1:16 PM  Group Topic/Focus:  Physical Wellness:   The focus of this group is to encourage patients  to improve and maintain their physical health through regular movement, exercise, and wellness practices. Today, the patients followed an exercise video.    Participation Level:  Did Not Attend    Jason Wolf 06/22/2024, 1:16 PM

## 2024-06-22 NOTE — Group Note (Signed)
 Date:  06/22/2024 Time:  6:50 PM  Group Topic/Focus:  Chaplain Group (Grief and Loss)    Participation Level:  Did Not Attend  KENDRICK HAAPALA 06/22/2024, 6:50 PM

## 2024-06-22 NOTE — Plan of Care (Signed)

## 2024-06-22 NOTE — Progress Notes (Signed)
   06/22/24 1000  Psych Admission Type (Psych Patients Only)  Admission Status Voluntary  Psychosocial Assessment  Patient Complaints None  Eye Contact Fair  Facial Expression Animated  Affect Appropriate to circumstance  Speech Logical/coherent  Interaction Assertive  Motor Activity Slow  Appearance/Hygiene Unremarkable  Behavior Characteristics Cooperative;Appropriate to situation  Mood Pleasant  Thought Process  Coherency WDL  Content WDL  Delusions None reported or observed  Perception WDL  Hallucination None reported or observed  Judgment Impaired  Confusion None  Danger to Self  Current suicidal ideation? Denies  Agreement Not to Harm Self Yes  Description of Agreement verbal  Danger to Others  Danger to Others None reported or observed

## 2024-06-22 NOTE — Inpatient Diabetes Management (Addendum)
 Inpatient Diabetes Program Recommendations  AACE/ADA: New Consensus Statement on Inpatient Glycemic Control (2015)  Target Ranges:  Prepandial:   less than 140 mg/dL      Peak postprandial:   less than 180 mg/dL (1-2 hours)      Critically ill patients:  140 - 180 mg/dL    Latest Reference Range & Units 04/10/24 11:25  Hemoglobin A1C 4.8 - 5.6 % 7.0 (H)  (H): Data is abnormally high  Latest Reference Range & Units 06/21/24 05:59 06/21/24 08:22 06/21/24 14:39 06/21/24 17:09 06/21/24 20:50  Glucose-Capillary 70 - 99 mg/dL 740 (H)  8 units Novolog   350 (H) 345 (H)  11 units Novolog   256 (H)  5 units Novolog   368 (H)  5 units Novolog    (H): Data is abnormally high  Latest Reference Range & Units 06/22/24 06:18  Glucose-Capillary 70 - 99 mg/dL 761 (H)  5 units Novolog    (H): Data is abnormally high    Admit with: Suicide Attempt via OD (900 mg Dextromethorphan) and worsening depression   History: DM2, Polysubstance Abuse   Home DM Meds:  Freestyle Libre 3 CGM Mounjaro  7.5 mg Qweek PCP notes from 04/16/2024 state pt also taking Semglee  6 units AM/ 54 units PM + Novolog  15-20 units TID  Current Orders: Novolog  Moderate Correction Scale/ SSI (0-15 units) TID AC + HS    MD- Please consider the following for improved inpatient glucose control:  1. Start Semglee  15 units at Bedtime (0.15 units/kg)--Per PCP notes from Sept 2025, Pt taking Semglee  and Novolog  insulins at home  2. Start Novolog  Meal Coverage: Novolog  6 units TID with meals HOLD if pt NPO HOLD if pt eats <50% meals     --Will follow patient during hospitalization--  Adina Rudolpho Arrow RN, MSN, CDCES Diabetes Coordinator Inpatient Glycemic Control Team Team Pager: [phone] (8a-5p)

## 2024-06-22 NOTE — Group Note (Signed)
 Recreation Therapy Group Note   Group Topic:Team Building  Group Date: 06/22/2024 Start Time: 0940 End Time: 1005 Facilitators: Dannie Hattabaugh-McCall, LRT,CTRS Location: [address] Dayroom   Group Topic: Communication, Team Building, Problem Solving  Goal Area(s) Addresses:  Patient will effectively work with peer towards shared goal.  Patient will identify skills used to make activity successful.  Patient will share challenges and verbalize solution-driven approaches used. Patient will identify how skills used during activity can be used to reach post d/c goals.   Behavioral Response:   Intervention: STEM Activity   Activity: [employer]. Patients were provided the following materials: 4 drinking straws, 5 rubber bands, 5 paper clips, 2 index cards and 2 drinking cups. Using the provided materials patients were asked to build a launching mechanism to launch a ping pong ball across the room, approximately 10 feet. Patients were divided into teams of 3-5. Instructions required all materials be incorporated into the device, functionality of items left to the peer group's discretion.  Education: Pharmacist, Community, Scientist, Physiological, Air Cabin Crew, Building Control Surveyor.   Education Outcome: Acknowledges education/In group clarification offered/Needs additional education.    Affect/Mood: N/A   Participation Level: Did not attend    Clinical Observations/Individualized Feedback:      Plan: Continue to engage patient in RT group sessions 2-3x/week.   Emaan Gary-McCall, LRT,CTRS 06/22/2024 12:18 PM

## 2024-06-22 NOTE — BH IP Treatment Plan (Signed)
 Interdisciplinary Treatment and Diagnostic Plan Update  06/22/2024 Time of Session: 10:20 AM Jason Wolf MRN: [id number]  Principal Diagnosis: MDD (major depressive disorder), recurrent severe, without psychosis (HCC)  Secondary Diagnoses: Principal Problem:   MDD (major depressive disorder), recurrent severe, without psychosis (HCC)   Current Medications:  Current Facility-Administered Medications  Medication Dose Route Frequency Provider Last Rate Last Admin   acetaminophen  (TYLENOL ) tablet 650 mg  650 mg Oral Q6H PRN Starkes-Perry, [name], FNP   650 mg at 06/19/24 1153   alum & mag hydroxide-simeth (MAALOX/MYLANTA) 200-200-20 MG/5ML suspension 30 mL  30 mL Oral Q4H PRN Starkes-Perry, [name], FNP       haloperidol  (HALDOL ) tablet 5 mg  5 mg Oral TID PRN Wilkie [name], FNP       And   diphenhydrAMINE  (BENADRYL ) capsule 50 mg  50 mg Oral TID PRN Starkes-Perry, [name], FNP       haloperidol  lactate (HALDOL ) injection 5 mg  5 mg Intramuscular TID PRN Starkes-Perry, [name], FNP       And   diphenhydrAMINE  (BENADRYL ) injection 50 mg  50 mg Intramuscular TID PRN Starkes-Perry, [name], FNP       And   LORazepam  (ATIVAN ) injection 2 mg  2 mg Intramuscular TID PRN Starkes-Perry, [name], FNP       haloperidol  lactate (HALDOL ) injection 10 mg  10 mg Intramuscular TID PRN Starkes-Perry, [name], FNP       And   diphenhydrAMINE  (BENADRYL ) injection 50 mg  50 mg Intramuscular TID PRN Starkes-Perry, [name], FNP       And   LORazepam  (ATIVAN ) injection 2 mg  2 mg Intramuscular TID PRN Wilkie [name], FNP       hydrOXYzine  (ATARAX ) tablet 25 mg  25 mg Oral TID PRN [name], [name], DO   25 mg at 06/21/24 2058   insulin  aspart (novoLOG ) injection 0-15 Units  0-15 Units Subcutaneous TID WC [name], [name], DO   8 Units at 06/22/24 1159   insulin  aspart (novoLOG ) injection 0-5 Units  0-5 Units Subcutaneous QHS [name], [name], DO   5 Units at 06/21/24 2058    losartan  (COZAAR ) tablet 50 mg  50 mg Oral Daily Wilkie [name], FNP   50 mg at 06/22/24 0745   magnesium  hydroxide (MILK OF MAGNESIA) suspension 30 mL  30 mL Oral Daily PRN Wilkie [name], FNP       [START ON 06/23/2024] sertraline  (ZOLOFT ) tablet 150 mg  150 mg Oral Daily [name], [name], DO       traZODone  (DESYREL ) tablet 100 mg  100 mg Oral QHS PRN Starkes-Perry, [name], FNP   100 mg at 06/21/24 2058   PTA Medications: Medications Prior to Admission  Medication Sig Dispense Refill Last Dose/Taking   losartan  (COZAAR ) 50 MG tablet Take 50 mg by mouth daily.   Past Week   meloxicam (MOBIC) 15 MG tablet Take 15 mg by mouth daily as needed for pain.   Past Month   MOUNJARO  7.5 MG/0.5ML Pen Inject 7.5 mg into the skin every Friday.   Past Week   sertraline  (ZOLOFT ) 100 MG tablet Take 150 mg by mouth daily.   Past Week   traZODone  (DESYREL ) 50 MG tablet Take 50 mg by mouth at bedtime.   Past Week   Continuous Blood Gluc Sensor (FREESTYLE LIBRE 3 SENSOR) MISC Change sensor every 14 days. 2 each 11    Glecaprevir -Pibrentasvir  (MAVYRET ) 100-40 MG TABS Take  3 tablets by mouth daily with breakfast. (Patient not taking: Reported on 04/10/2024) 84 tablet 1    glucose monitoring kit (FREESTYLE) monitoring kit 1 each by Does not apply route 4 (four) times daily - after meals and at bedtime. 1 month Diabetic Testing Supplies for QAC-QHS accuchecks. 1 each 1    hydrOXYzine  (VISTARIL ) 25 MG capsule Take 25 mg by mouth daily as needed for anxiety (or sleep).      Insulin  Pen Needle (PEN NEEDLES) 32G X 4 MM MISC Use as instructed. Inject into the skin 5 times per day 200 each 6     Patient Stressors: Educational concerns   Health problems   Medication change or noncompliance   Occupational concerns   Substance abuse    Patient Strengths: Capable of independent living  Communication skills  Supportive family/friends  Work skills   Treatment Modalities: Medication Management,  Group therapy, Case management,  1 to 1 session with clinician, Psychoeducation, Recreational therapy.   Physician Treatment Plan for Primary Diagnosis: MDD (major depressive disorder), recurrent severe, without psychosis (HCC) Long Term Goal(s): Improvement in symptoms so as ready for discharge   Short Term Goals: Ability to identify changes in lifestyle to reduce recurrence of condition will improve Ability to verbalize feelings will improve Ability to disclose and discuss suicidal ideas Ability to demonstrate self-control will improve Ability to identify and develop effective coping behaviors will improve Compliance with prescribed medications will improve Ability to identify triggers associated with substance abuse/mental health issues will improve  Medication Management: Evaluate patient's response, side effects, and tolerance of medication regimen.  Therapeutic Interventions: 1 to 1 sessions, Unit Group sessions and Medication administration.  Evaluation of Outcomes: Not Progressing  Physician Treatment Plan for Secondary Diagnosis: Principal Problem:   MDD (major depressive disorder), recurrent severe, without psychosis (HCC)  Long Term Goal(s): Improvement in symptoms so as ready for discharge   Short Term Goals: Ability to identify changes in lifestyle to reduce recurrence of condition will improve Ability to verbalize feelings will improve Ability to disclose and discuss suicidal ideas Ability to demonstrate self-control will improve Ability to identify and develop effective coping behaviors will improve Compliance with prescribed medications will improve Ability to identify triggers associated with substance abuse/mental health issues will improve     Medication Management: Evaluate patient's response, side effects, and tolerance of medication regimen.  Therapeutic Interventions: 1 to 1 sessions, Unit Group sessions and Medication administration.  Evaluation of  Outcomes: Not Progressing   RN Treatment Plan for Primary Diagnosis: MDD (major depressive disorder), recurrent severe, without psychosis (HCC) Long Term Goal(s): Knowledge of disease and therapeutic regimen to maintain health will improve  Short Term Goals: Ability to remain free from injury will improve, Ability to verbalize frustration and anger appropriately will improve, Ability to demonstrate self-control, Ability to participate in decision making will improve, Ability to verbalize feelings will improve, Ability to disclose and discuss suicidal ideas, Ability to identify and develop effective coping behaviors will improve, and Compliance with prescribed medications will improve  Medication Management: RN will administer medications as ordered by provider, will assess and evaluate patient's response and provide education to patient for prescribed medication. RN will report any adverse and/or side effects to prescribing provider.  Therapeutic Interventions: 1 on 1 counseling sessions, Psychoeducation, Medication administration, Evaluate responses to treatment, Monitor vital signs and CBGs as ordered, Perform/monitor CIWA, COWS, AIMS and Fall Risk screenings as ordered, Perform wound care treatments as ordered.  Evaluation of Outcomes: Not Progressing  LCSW Treatment Plan for Primary Diagnosis: MDD (major depressive disorder), recurrent severe, without psychosis (HCC) Long Term Goal(s): Safe transition to appropriate next level of care at discharge, Engage patient in therapeutic group addressing interpersonal concerns.  Short Term Goals: Engage patient in aftercare planning with referrals and resources, Increase social support, Increase ability to appropriately verbalize feelings, Increase emotional regulation, Facilitate acceptance of mental health diagnosis and concerns, Facilitate patient progression through stages of change regarding substance use diagnoses and concerns, Identify triggers  associated with mental health/substance abuse issues, and Increase skills for wellness and recovery  Therapeutic Interventions: Assess for all discharge needs, 1 to 1 time with Social worker, Explore available resources and support systems, Assess for adequacy in community support network, Educate family and significant other(s) on suicide prevention, Complete Psychosocial Assessment, Interpersonal group therapy.  Evaluation of Outcomes: Not Progressing   Progress in Treatment: Attending groups: No. Participating in groups: No. Taking medication as prescribed: Yes. Toleration medication: Yes. Family/Significant other contact made: Yes, individual(s) contacted:  Keevin Panebianco (father) [phone]. Patient understands diagnosis: Yes. Discussing patient identified problems/goals with staff: Yes. Medical problems stabilized or resolved: Yes. Denies suicidal/homicidal ideation: Yes. Issues/concerns per patient self-inventory: No. None reported.  New problem(s) identified: No, Describe:  None identified.  New Short Term/Long Term Goal(s): detox, medication management for mood stabilization; elimination of SI thoughts; development of comprehensive mental wellness/sobriety plan  Patient Goals:  Getting a foundation again under me. Getting some normalcy day to day.   Discharge Plan or Barriers: Patient recently admitted. CSW will continue to follow and assess for appropriate referrals and possible discharge planning.    Reason for Continuation of Hospitalization: Anxiety Depression Medication stabilization Withdrawal symptoms  Estimated Length of Stay: 1-3 days.  Last 3 [place] Suicide Severity Risk Score: Flowsheet Row Ad[facility] (Current) [facility] from 06/18/2024 in [facility] Department at [facility] ED from 06/03/2024 in [facility] Emergency Department at [facility]  C-SSRS RISK CATEGORY Moderate  Risk High Risk No Risk    Last [facility] 2/9 Scores:    12/02/2023    1:50 PM 06/14/2021   11:21 AM 06/29/2020    2:31 PM  Depression screen PHQ 2/9  Decreased Interest 0 1 1  Down, Depressed, Hopeless 0 2 2  PHQ - 2 Score 0 3 3  Altered sleeping 0 3 3  Tired, decreased energy 0 1 1  Change in appetite 0 0 1  Feeling bad or failure about yourself  0 1 0  Trouble concentrating 0 2 0  Moving slowly or fidgety/restless 0 0 0  Suicidal thoughts 0 1 0  PHQ-9 Score 0  11  8   Difficult doing work/chores  Somewhat difficult      Data saved with a previous flowsheet row definition    Scribe for Treatment Team: Louetta Wynona SILK 06/22/2024 2:04 PM

## 2024-06-22 NOTE — Progress Notes (Signed)
   06/22/24 2300  Psych Admission Type (Psych Patients Only)  Admission Status Voluntary  Psychosocial Assessment  Patient Complaints None  Eye Contact Fair  Facial Expression Animated  Affect Appropriate to circumstance  Speech Logical/coherent  Interaction Assertive  Motor Activity Slow  Appearance/Hygiene Unremarkable  Behavior Characteristics Cooperative;Appropriate to situation  Mood Pleasant  Thought Process  Coherency WDL  Content WDL  Delusions None reported or observed  Perception WDL  Hallucination None reported or observed  Judgment Impaired  Confusion None  Danger to Self  Current suicidal ideation? Denies  Agreement Not to Harm Self Yes  Description of Agreement Verbal  Danger to Others  Danger to Others None reported or observed

## 2024-06-22 NOTE — [facility] Group Notes (Signed)
 [facility] Group Notes:  (Nursing/MHT/Case Management/Adjunct)  Date:  06/22/2024  Time:  9:28 PM  Type of Therapy:  Psychoeducational Skills  Participation Level:  Did Not Attend  Participation Quality:  Did not attend   Affect:  Did not attend   Cognitive:  Did not attend   Insight:  None  Engagement in Group:  Did not attend   Modes of Intervention:  Did not attend   Summary of Progress/Problems: Patient did not attend group this evening.   Vernis Eid S 06/22/2024, 9:28 PM

## 2024-06-22 NOTE — [facility] Suicide Risk Assessment (Signed)
 [facility] INPATIENT:  Family/Significant Other Suicide Prevention Education  Suicide Prevention Education:  Education Completed; Raydel Hosick (father) [phone],  (name of family member/significant other) has been identified by the patient as the family member/significant other with whom the patient will be residing, and identified as the person(s) who will aid the patient in the event of a mental health crisis (suicidal ideations/suicide attempt).  With written consent from the patient, the family member/significant other has been provided the following suicide prevention education, prior to the and/or following the discharge of the patient.  Father is a major support for patient, they talk daily and see each other often. 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 Father does report pt has been to the ED numerous times this year for taking too much DXM. No safety concerns with patient returning to campus at discharge. No access to weapons or firearms.   Father will pick pt up at discharge, will need call back regarding discharge date, will plan to pick up at 10am on day of discharge.  The suicide prevention education provided includes the following: Suicide risk factors Suicide prevention and interventions National Suicide Hotline telephone number [facility] assessment telephone number [facility] Emergency Assistance 911 [facility] and/or Residential Mobile Crisis Unit telephone number  Request made of family/significant other to: Remove weapons (e.g., guns, rifles, knives), all items previously/currently identified as safety concern.   Remove drugs/medications (over-the-counter, prescriptions, illicit drugs), all items previously/currently identified as a safety concern.  The family member/significant other verbalizes understanding of the suicide prevention education information provided.  The family member/significant other agrees to remove the items of  safety concern listed above.  Jenkins LULLA Primer 06/22/2024, 1:35 PM

## 2024-06-22 NOTE — Group Note (Signed)
 Date:  06/22/2024 Time:  6:58 PM  Group Topic/Focus:  Occupational Therapy    Participation Level:  Did Not Attend  MATIS MONNIER 06/22/2024, 6:58 PM

## 2024-06-22 NOTE — Group Note (Signed)
 Date:  06/22/2024 Time:  10:01 AM  Group Topic/Focus:  Goals Group:   The focus of this group is to help patients establish daily goals to achieve during treatment and discuss how the patient can incorporate goal setting into their daily lives to aide in recovery.    Participation Level:  Did Not Attend  Participation Quality:  Did Not Attend  Affect:  Did Not Attend  Cognitive:  Did Not Attend  Insight: None  Engagement in Group:  Did Not Attend  Modes of Intervention:  Did Not Attend  Additional Comments:  Did Not Attend  Jason Wolf 06/22/2024, 10:01 AM

## 2024-06-22 NOTE — Progress Notes (Signed)
 [facility] MD Progress Note  06/22/2024 12:57 PM Jason Wolf  MRN:  [id number] Subjective:   Jason Wolf is a 43 yr old male who presented on 11/21 to [facility] after a Suicide Attempt via OD (900 mg Dextromethorphan) and worsening depression in the setting of not taking his medication, he was admitted to [facility] on 11/21.  PPHx is significant for Depression, Anxiety, Polysubstance Abuse (Cough Med, Hx of Meth), Borderline Personality Disorder, and 5 Suicide Attempts (last OD- cough medicines 2023), Self Injurious Behavior (cutting), and 4 Prior Psychiatric Hospitalizations (last 10 yrs ago [place]).    Case was discussed in the multidisciplinary team. MAR was reviewed and patient was compliant with medications.  He received PRN Hydroxyzine  and Trazodone  yesterday.   Psychiatric Team made the following recommendations yesterday: -Increase Zoloft  to 100 mg daily for depression and anxiety    On interview today patient reports he slept good last night.  He reports his appetite is doing good.  He reports no SI, HI, or AVH.  He reports no Paranoia or Ideas of Reference.  He reports no issues with his medications.  He reports that his father visited last night and it went well.  He reports he has been making an effort to talk with other patients in the milieu which is hard for him.  Discussed with him we would plan to increase his Zoloft  tomorrow and he was agreeable.  He reports no other concerns at present.   Principal Problem: MDD (major depressive disorder), recurrent severe, without psychosis (HCC) Diagnosis: Principal Problem:   MDD (major depressive disorder), recurrent severe, without psychosis (HCC)  Total Time spent with patient:  I personally spent 35 minutes on the unit in direct patient care. The direct patient care time included face-to-face time with the patient, reviewing the patient's chart, communicating with other professionals, and coordinating care.    Past Psychiatric History:   Depression, Anxiety, Polysubstance Abuse (Cough Med, Hx of Meth), Borderline Personality Disorder, and 5 Suicide Attempts (last OD- cough medicines 2023), Self Injurious Behavior (cutting), and 4 Prior Psychiatric Hospitalizations (last 10 yrs ago [place]).   Past Medical History:  Past Medical History:  Diagnosis Date   Bipolar disorder (HCC)    DM (diabetes mellitus) (HCC)    ETOH abuse    Hepatitis C    Hypertension    Opiate abuse, episodic (HCC)     Past Surgical History:  Procedure Laterality Date   I & D EXTREMITY Right 02/20/2020   Procedure: IRRIGATION AND DEBRIDEMENT HAND;  Surgeon: Carolee Lynwood JINNY DOUGLAS, MD;  Location: [facility] OR;  Service: Orthopedics;  Laterality: Right;   KNEE ARTHROSCOPY     Family History:  Family History  Problem Relation Age of Onset   Diabetes type II Mother    Diabetes Mother    Family Psychiatric  History:  No Known Diagnosis', Suicides, or Substance Abuse   Social History:  Social History   Substance and Sexual Activity  Alcohol Use No   Comment: in rehab     Social History   Substance and Sexual Activity  Drug Use Not Currently   Types: Methamphetamines   Comment: was in rehab and relapsed after 22 months. 02/19/20    Social History   Socioeconomic History   Marital status: Single    Spouse name: Not on file   Number of children: Not on file   Years of education: Not on file   Highest education level: Not on file  Occupational History   Not  on file  Tobacco Use   Smoking status: Every Day    Current packs/day: 0.25    Average packs/day: 0.3 packs/day for 23.9 years (6.0 ttl pk-yrs)    Types: Cigarettes    Start date: 2002   Smokeless tobacco: Never  Vaping Use   Vaping status: Every Day  Substance and Sexual Activity   Alcohol use: No    Comment: in rehab   Drug use: Not Currently    Types: Methamphetamines    Comment: was in rehab and relapsed after 22 months. 02/19/20   Sexual activity: Not Currently  Other Topics  Concern   Not on file  Social History Narrative   Not on file   Social Drivers of Health   Financial Resource Strain: Not on file  Food Insecurity: No Food Insecurity (06/19/2024)   Hunger Vital Sign    Worried About Running Out of Food in the Last Year: Never true    Ran Out of Food in the Last Year: Never true  Transportation Needs: No Transportation Needs (06/19/2024)   PRAPARE - Administrator, Civil Service (Medical): No    Lack of Transportation (Non-Medical): No  Physical Activity: Not on file  Stress: Not on file  Social Connections: Not on file   Additional Social History:                         Sleep: Good Estimated Sleeping Duration (Last 24 Hours): 8.50-10.00 hours (Due to Daylight Saving Time, the durations displayed may not accurately represent documentation during the time change interval)  Appetite:  Good  Current Medications: Current Facility-Administered Medications  Medication Dose Route Frequency Provider Last Rate Last Admin   acetaminophen  (TYLENOL ) tablet 650 mg  650 mg Oral Q6H PRN Starkes-Perry, [name], FNP   650 mg at 06/19/24 1153   alum & mag hydroxide-simeth (MAALOX/MYLANTA) 200-200-20 MG/5ML suspension 30 mL  30 mL Oral Q4H PRN Starkes-Perry, [name], FNP       haloperidol  (HALDOL ) tablet 5 mg  5 mg Oral TID PRN Wilkie [name], FNP       And   diphenhydrAMINE  (BENADRYL ) capsule 50 mg  50 mg Oral TID PRN Starkes-Perry, [name], FNP       haloperidol  lactate (HALDOL ) injection 5 mg  5 mg Intramuscular TID PRN Starkes-Perry, [name], FNP       And   diphenhydrAMINE  (BENADRYL ) injection 50 mg  50 mg Intramuscular TID PRN Starkes-Perry, [name], FNP       And   LORazepam  (ATIVAN ) injection 2 mg  2 mg Intramuscular TID PRN Starkes-Perry, [name], FNP       haloperidol  lactate (HALDOL ) injection 10 mg  10 mg Intramuscular TID PRN Starkes-Perry, [name], FNP       And   diphenhydrAMINE  (BENADRYL ) injection 50 mg  50 mg  Intramuscular TID PRN Starkes-Perry, [name], FNP       And   LORazepam  (ATIVAN ) injection 2 mg  2 mg Intramuscular TID PRN Wilkie [name], FNP       hydrOXYzine  (ATARAX ) tablet 25 mg  25 mg Oral TID PRN Sarafina Puthoff S, DO   25 mg at 06/21/24 2058   insulin  aspart (novoLOG ) injection 0-15 Units  0-15 Units Subcutaneous TID WC Labrenda Lasky S, DO   8 Units at 06/22/24 1159   insulin  aspart (novoLOG ) injection 0-5 Units  0-5 Units Subcutaneous QHS Zurisadai Helminiak S, DO  5 Units at 06/21/24 2058   losartan  (COZAAR ) tablet 50 mg  50 mg Oral Daily Starkes-Perry, [name], FNP   50 mg at 06/22/24 0745   magnesium  hydroxide (MILK OF MAGNESIA) suspension 30 mL  30 mL Oral Daily PRN Wilkie [name], FNP       [START ON 06/23/2024] sertraline  (ZOLOFT ) tablet 150 mg  150 mg Oral Daily Buddie Marston S, DO       traZODone  (DESYREL ) tablet 100 mg  100 mg Oral QHS PRN Starkes-Perry, [name], FNP   100 mg at 06/21/24 2058    Lab Results:  Results for orders placed or performed during the hospital encounter of 06/19/24 (from the past 48 hours)  Glucose, capillary     Status: Abnormal   Collection Time: 06/20/24  4:59 PM  Result Value Ref Range   Glucose-Capillary 321 (H) 70 - 99 mg/dL    Comment: Glucose reference range applies only to samples taken after fasting for at least 8 hours.  Glucose, capillary     Status: Abnormal   Collection Time: 06/20/24  8:21 PM  Result Value Ref Range   Glucose-Capillary 336 (H) 70 - 99 mg/dL    Comment: Glucose reference range applies only to samples taken after fasting for at least 8 hours.   Comment 1 Notify RN    Comment 2 Document in Chart   Glucose, capillary     Status: Abnormal   Collection Time: 06/21/24  5:59 AM  Result Value Ref Range   Glucose-Capillary 259 (H) 70 - 99 mg/dL    Comment: Glucose reference range applies only to samples taken after fasting for at least 8 hours.  Glucose, capillary     Status: Abnormal    Collection Time: 06/21/24  8:22 AM  Result Value Ref Range   Glucose-Capillary 350 (H) 70 - 99 mg/dL    Comment: Glucose reference range applies only to samples taken after fasting for at least 8 hours.  Glucose, capillary     Status: Abnormal   Collection Time: 06/21/24  2:39 PM  Result Value Ref Range   Glucose-Capillary 345 (H) 70 - 99 mg/dL    Comment: Glucose reference range applies only to samples taken after fasting for at least 8 hours.  Glucose, capillary     Status: Abnormal   Collection Time: 06/21/24  5:09 PM  Result Value Ref Range   Glucose-Capillary 256 (H) 70 - 99 mg/dL    Comment: Glucose reference range applies only to samples taken after fasting for at least 8 hours.  Glucose, capillary     Status: Abnormal   Collection Time: 06/21/24  8:50 PM  Result Value Ref Range   Glucose-Capillary 368 (H) 70 - 99 mg/dL    Comment: Glucose reference range applies only to samples taken after fasting for at least 8 hours.  Glucose, capillary     Status: Abnormal   Collection Time: 06/22/24  6:18 AM  Result Value Ref Range   Glucose-Capillary 238 (H) 70 - 99 mg/dL    Comment: Glucose reference range applies only to samples taken after fasting for at least 8 hours.  Glucose, capillary     Status: Abnormal   Collection Time: 06/22/24 11:54 AM  Result Value Ref Range   Glucose-Capillary 298 (H) 70 - 99 mg/dL    Comment: Glucose reference range applies only to samples taken after fasting for at least 8 hours.    Blood Alcohol level:  Lab Results  Component Value Date  ETH <15 06/19/2024   ETH <15 05/07/2024    Metabolic Disorder Labs: Lab Results  Component Value Date   HGBA1C 7.0 (H) 04/10/2024   MPG 154.2 04/10/2024   MPG 240.3 04/30/2023   No results found for: PROLACTIN Lab Results  Component Value Date   CHOL 289 (H) 04/14/2021   TRIG 891 (H) 04/14/2021   HDL 38 (L) 04/14/2021   CHOLHDL 7.6 04/14/2021   VLDL UNABLE TO CALCULATE IF TRIGLYCERIDE OVER 400  mg/dL 90/83/7977   LDLCALC UNABLE TO CALCULATE IF TRIGLYCERIDE OVER 400 mg/dL 90/83/7977   LDLCALC 864 (H) 06/29/2020    Physical Findings: AIMS:  ,  ,  ,  ,  ,  ,   CIWA:    COWS:     Musculoskeletal: Strength & Muscle Tone: within normal limits Gait & Station: normal Patient leans: N/A  Psychiatric Specialty Exam:  Presentation  General Appearance:  Casual  Eye Contact: Good  Speech: Clear and Coherent; Normal Rate  Speech Volume: Normal  Handedness: Right   Mood and Affect  Mood: Dysphoric  Affect: Congruent   Thought Process  Thought Processes: Coherent; Goal Directed  Descriptions of Associations:Intact  Orientation:Full (Time, Place and Person)  Thought Content:Logical; WDL  History of Schizophrenia/Schizoaffective disorder:No  Duration of Psychotic Symptoms:No data recorded Hallucinations:Hallucinations: None  Ideas of Reference:None  Suicidal Thoughts:Suicidal Thoughts: No  Homicidal Thoughts:Homicidal Thoughts: No   Sensorium  Memory: Immediate Fair; Recent Fair  Judgment: Fair  Insight: Fair   Art Therapist  Concentration: Fair  Attention Span: Fair  Recall: Fair  Fund of Knowledge: Good  Language: Good   Psychomotor Activity  Psychomotor Activity: Psychomotor Activity: Normal   Assets  Assets: Communication Skills; Desire for Improvement; Resilience; Social Support; Housing; Vocational/Educational   Sleep  Sleep: Sleep: Good    Physical Exam: Physical Exam Vitals and nursing note reviewed.  Constitutional:      General: He is not in acute distress.    Appearance: Normal appearance. He is normal weight. He is not ill-appearing or toxic-appearing.  HENT:     Head: Normocephalic and atraumatic.  Pulmonary:     Effort: Pulmonary effort is normal.  Musculoskeletal:        General: Normal range of motion.  Neurological:     General: No focal deficit present.     Mental Status: He is  alert.    Review of Systems  Respiratory:  Negative for cough and shortness of breath.   Cardiovascular:  Negative for chest pain.  Gastrointestinal:  Negative for abdominal pain, constipation, diarrhea, nausea and vomiting.  Neurological:  Negative for dizziness, weakness and headaches.  Psychiatric/Behavioral:  Positive for depression (improving). Negative for hallucinations and suicidal ideas. The patient is not nervous/anxious.    Blood pressure 133/85, pulse 82, temperature 98 F (36.7 C), temperature source Oral, resp. rate 20, height 5' 11 (1.803 m), weight 99.8 kg, SpO2 100%. Body mass index is 30.68 kg/m.   Treatment Plan Summary: Daily contact with patient to assess and evaluate symptoms and progress in treatment and Medication management   Jason Wolf is a 43 yr old male who presented on 11/21 to [facility] after a Suicide Attempt via OD (900 mg Dextromethorphan) and worsening depression in the setting of not taking his medication, he was admitted to [facility] on 11/21.  PPHx is significant for Depression, Anxiety, Polysubstance Abuse (Cough Med, Hx of Meth), Borderline Personality Disorder, and 5 Suicide Attempts (last OD- cough medicines 2023), Self Injurious Behavior (cutting), and 4  Prior Psychiatric Hospitalizations (last 10 yrs ago [place]).      Jason Wolf has tolerated the increase in his Zoloft .  We will further increase his Zoloft  tomorrow.  We will not make any changes to his medications at this time.  We will continue to monitor.     MDD, Recurrent, Severe, w/out Psychyosis  Anxiety: -Continue Zoloft  100 mg daily for depression and anxiety -Continue Agitation Protocol: Haldol /Ativan /Benadryl      HTN: -Continue Losartan  50 mg daily     Diabetes: -Continue Sliding Scale     -Continue PRN's: Tylenol , Maalox, Atarax , Milk of Magnesia, Trazodone    --  The risks/benefits/side-effects/alternatives to medications were discussed in detail with the patient and time was given for  questions. The patient consents to medication trials.               -- Encouraged patient to participate in unit milieu and in scheduled group therapies              -- Short Term Goals: Ability to identify changes in lifestyle to reduce recurrence of condition will improve, Ability to verbalize feelings will improve, Ability to disclose and discuss suicidal ideas, Ability to demonstrate self-control will improve, Ability to identify and develop effective coping behaviors will improve, Ability to maintain clinical measurements within normal limits will improve, Compliance with prescribed medications will improve, and Ability to identify triggers associated with substance abuse/mental health issues will improve             -- Long Term Goals: Improvement in symptoms so as ready for discharge   Safety and Monitoring:             -- Voluntary admission to inpatient psychiatric unit for safety, stabilization and treatment             -- Daily contact with patient to assess and evaluate symptoms and progress in treatment             -- Patient's case to be discussed in multi-disciplinary team meeting             -- Observation Level : q15 minute checks             -- Vital signs:  q12 hours             -- Precautions: suicide, elopement, and assault  Discharge Planning:              -- Social work and case management to assist with discharge planning and identification of hospital follow-up needs prior to discharge             -- Estimated LOS: 2-4 more days             -- Discharge Concerns: Need to establish a safety plan; Medication compliance and effectiveness             -- Discharge Goals: Return home with outpatient referrals for mental health follow-up including medication management/psychotherapy  Jason GORMAN Rosser, DO 06/22/2024, 12:57 PM

## 2024-06-23 LAB — GLUCOSE, CAPILLARY
Glucose-Capillary: 248 mg/dL — ABNORMAL HIGH (ref 70–99)
Glucose-Capillary: 429 mg/dL — ABNORMAL HIGH (ref 70–99)
Glucose-Capillary: 470 mg/dL — ABNORMAL HIGH (ref 70–99)
Glucose-Capillary: 335 mg/dL — ABNORMAL HIGH (ref 70–99)
Glucose-Capillary: 323 mg/dL — ABNORMAL HIGH (ref 70–99)

## 2024-06-23 MED ORDER — INSULIN ASPART 100 UNIT/ML IJ SOLN
5.0000 [IU] | Freq: Three times a day (TID) | INTRAMUSCULAR | Status: DC
  Administered 2024-06-23 – 2024-06-24 (×2): 5 [IU] via SUBCUTANEOUS

## 2024-06-23 MED ORDER — INSULIN GLARGINE-YFGN 100 UNIT/ML ~~LOC~~ SOLN
10.0000 [IU] | Freq: Every day | SUBCUTANEOUS | Status: DC
  Administered 2024-06-23: 10 [IU] via SUBCUTANEOUS

## 2024-06-23 NOTE — Inpatient Diabetes Management (Addendum)
 Inpatient Diabetes Program Recommendations  AACE/ADA: New Consensus Statement on Inpatient Glycemic Control (2015)  Target Ranges:  Prepandial:   less than 140 mg/dL      Peak postprandial:   less than 180 mg/dL (1-2 hours)      Critically ill patients:  140 - 180 mg/dL   Lab Results  Component Value Date   GLUCAP 248 (H) 06/23/2024   HGBA1C 7.0 (H) 04/10/2024    Review of Glycemic Control  Latest Reference Range & Units 06/22/24 06:18 06/22/24 11:54 06/22/24 17:12 06/22/24 20:24 06/23/24 06:11  Glucose-Capillary 70 - 99 mg/dL 761 (H) 701 (H) 619 (H) 223 (H) 248 (H)   Diabetes history: DM 2 Outpatient Diabetes medications: Mounjaro  7.5 mg QFriday Current orders for Inpatient glycemic control:  Novolog  0-15 units tid + hs A1c 7% on 9/12  Note: pt received 33 units of Novolog  in the last 24 hours  Inpatient Diabetes Program Recommendations:    -   Start Semglee  15 units  Thanks, Clotilda Bull RN, MSN, BC-ADM Inpatient Diabetes Coordinator Team Pager [phone] (8a-5p)

## 2024-06-23 NOTE — Group Note (Signed)
 Date:  06/23/2024 Time:  12:02 PM  Group Topic/Focus:   Pet Therapy  Pt did attend pet therapy group   Corin Tilly D Merridith Dershem 06/23/2024, 12:02 PM

## 2024-06-23 NOTE — Group Note (Signed)
 Date:  06/23/2024 Time:  5:08 PM  Group Topic/Focus:  MHA/Coping Skills:   The purpose of this group is to help patients identify strategies for coping with mental health crisis.  Group discusses SMART goals   Pt did attend MHA/Coping Skills group   Tyrick Dunagan D Makaylynn Bonillas 06/23/2024, 5:08 PM

## 2024-06-23 NOTE — Group Note (Signed)
 Date:  06/23/2024 Time:  5:25 PM  Group Topic/Focus:  Goals Group:   The focus of this group is to help patients establish daily goals to achieve during treatment and discuss how the patient can incorporate goal setting into their daily lives to aide in recovery. Orientation:   The focus of this group is to educate the patient on the purpose and policies of crisis stabilization and provide a format to answer questions about their admission.  The group details unit policies and expectations of patients while admitted.    Participation Level:  Did Not Attend   BRITTAIN HOSIE 06/23/2024, 5:25 PM

## 2024-06-23 NOTE — Group Note (Signed)
 Recreation Therapy Group Note   Group Topic:Animal Assisted Therapy   Group Date: 06/23/2024 Start Time: 9052 End Time: 1030 Facilitators: Chinita Schimpf-McCall, LRT,CTRS Location: [address] Dayroom   Animal-Assisted Activity (AAA) Program Checklist/Progress Notes Patient Eligibility Criteria Checklist & Daily Group note for Rec Tx Intervention  AAA/T Program Assumption of Risk Form signed by Patient/ or Parent Legal Guardian Yes  Patient is free of allergies or severe asthma Yes  Patient reports no fear of animals Yes  Patient reports no history of cruelty to animals Yes  Patient understands his/her participation is voluntary Yes  Patient washes hands before animal contact Yes  Patient washes hands after animal contact Yes  Behavioral Response: Engaged   Education: Charity Fundraiser, Appropriate Animal Interaction   Education Outcome: Acknowledges education.    Affect/Mood: Appropriate   Participation Level: Engaged   Participation Quality: Independent   Behavior: Appropriate   Speech/Thought Process: Focused   Insight: Good   Judgement: Good   Modes of Intervention: Teaching Laboratory Technician   Patient Response to Interventions:  Engaged   Education Outcome:  In group clarification offered    Clinical Observations/Individualized Feedback: Patient attended session and interacted appropriately with therapy dog and peers. Patient asked appropriate questions about therapy dog and his training. Patient shared stories about their pets at home with group.     Plan: Continue to engage patient in RT group sessions 2-3x/week.   Jason Wolf, LRT,CTRS 06/23/2024 1:16 PM

## 2024-06-23 NOTE — Plan of Care (Signed)
Patient meeting care plan goals.

## 2024-06-23 NOTE — Progress Notes (Signed)
(  Sleep Hours) - 9.75 (Any PRNs that were needed, meds refused, or side effects to meds)- ATARAX  25MG , TRAZODONE  50MG , NO MEDS REFUSED, NO SIDE EFFECTS TO MEDS  (Any disturbances and when (visitation, over night)-N/A (Concerns raised by the patient)- N/A  (SI/HI/AVH)- DENIES

## 2024-06-23 NOTE — [facility] Group Notes (Signed)
 [facility] Group Notes:  (Nursing/MHT/Case Management/Adjunct)  Date:  06/23/2024  Time:  9:33 PM  Type of Therapy:  Psychoeducational Skills  Participation Level:  Did Not Attend  Participation Quality:  Did Not Attend  Affect:  Did not attend  Cognitive:  Did not attend   Insight:  None  Engagement in Group:  Did not attend  Modes of Intervention:  Education  Summary of Progress/Problems: The patient did not attend group this evening.   Gareth Fitzner S 06/23/2024, 9:33 PM

## 2024-06-23 NOTE — Group Note (Signed)
 Date:  06/23/2024 Time:  4:12 PM  Group Topic/Focus:  Self Esteem Action Plan:   The focus of this group is to help patients create a plan to continue to build self-esteem after discharge.    Participation Level:  Active  Participation Quality:  Appropriate  Affect:  Appropriate  Cognitive:  Appropriate  Insight: Appropriate  Engagement in Group:  Engaged  Modes of Intervention:  Discussion   [name]  Jason Wolf 06/23/2024, 4:12 PM

## 2024-06-23 NOTE — Group Note (Signed)
 LCSW Group Therapy Note   Group Date: 06/23/2024 Start Time: 1100 End Time: 1200   Participation:  patient was present and actively participated in the discussion.  Type of Therapy:  Group Therapy  Topic:  Stronger Together:  Building Healthy Relationships  Objective:  To explore loneliness, boundaries, and safe ways to build relationships.  Goals: Recognize healthy vs. unhealthy relationships. Learn safe ways to connect with others. Strengthen communication and murphy oil.  Summary:  Participants discussed loneliness, healthy connections, and setting boundaries. They explored safe ways to meet people and shared personal experiences. Key insights were reinforced through discussion and quotes.  Therapeutic Modalities Used: - Cognitive Behavioral Therapy (CBT) Elements - Identifying unhealthy relationship patterns, challenging negative thoughts about connection. - Dialectical Behavior Therapy (DBT) Elements - Interpersonal effectiveness, setting and maintaining boundaries. - Supportive Group Therapy - Peer discussion, shared experiences, and emotional validation.   Dajanee Voorheis O Patrik Turnbaugh, LCSWA 06/23/2024  12:57 PM

## 2024-06-23 NOTE — Plan of Care (Signed)
   Problem: Education: Goal: Emotional status will improve Outcome: Progressing Goal: Mental status will improve Outcome: Progressing   Problem: Activity: Goal: Interest or engagement in activities will improve Outcome: Progressing

## 2024-06-23 NOTE — Progress Notes (Addendum)
 [facility] MD Progress Note  06/23/2024 8:46 AM Jason Wolf  MRN:  [id number]   Principal Problem: MDD (major depressive disorder), recurrent severe, without psychosis (HCC) Diagnosis: Principal Problem:   MDD (major depressive disorder), recurrent severe, without psychosis (HCC)  Total Time spent with patient:  I personally spent 35 minutes on the unit in direct patient care. The direct patient care time included face-to-face time with the patient, reviewing the patient's chart, communicating with other professionals, and coordinating care.   Identifying Information and Psychiatric history: Jason Wolf is a 43 yr old male with a psychiatric history most consistent with MDD without psychosis. The patient presented on 11/21 to [facility] after a Suicide Attempt via OD (900 mg Dextromethorphan) and worsening depression in the setting of not taking his medication, he was admitted to [facility] on 11/21.   Patient's psychiatric history is significant for Depression, Anxiety, and substance use concerns: stimulants (hx of meth) and hallucinogens (Cough Meds). In addition , he has carried a diagnosis of Borderline Personality Disorder. The patient has a history of 5 Suicide Attempts (last OD via cough medicines 2023), Self Injurious Behavior (cutting), and 4 Prior Psychiatric Hospitalizations (last 10 yrs ago [place]).    Interval events: The patient has not been attending groups, largely isolative to room. Denying concerns on checks. Medication compliant, slep 9.75 hrs overnight. Denying SI, HI and AVH. BP (!) 139/94 (BP Location: Right Arm)   Pulse 96   Temp 97.7 F (36.5 C) (Oral)   Resp 20   Ht 5' 11 (1.803 m)   Wt 99.8 kg   SpO2 100%   BMI 30.68 kg/m  patient's blood sugars have been in 300s-400s   Interview: Today the patient states he is good. Denies having any depressive symptoms or suicidal thoughts. States he is back on the dose of zoloft  he was on most recently. Notes that he had only been off the  medication for about a week and it is typically helpful. Attributes worsening mood in part due to stopping this medicine largely because of busy schedule and increased stress. 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 He will continue to follow up with counselors at [facility] as he feels comfortable with them. Reports good support through dad. Good sleep, good appetite, and no SI, HI or AVH. Reports he is not going to groups as he is doing a degree in SW and feels weird doing them from the other end. Encouraged him to come out of his room and try today and the patient voiced agreement. He is otherwise enjoying reading in his room.    Past Medical History:  Past Medical History:  Diagnosis Date   Bipolar disorder (HCC)    DM (diabetes mellitus) (HCC)    ETOH abuse    Hepatitis C    Hypertension    Opiate abuse, episodic (HCC)     Past Surgical History:  Procedure Laterality Date   I & D EXTREMITY Right 02/20/2020   Procedure: IRRIGATION AND DEBRIDEMENT HAND;  Surgeon: Carolee Lynwood JINNY DOUGLAS, MD;  Location: [facility] OR;  Service: Orthopedics;  Laterality: Right;   KNEE ARTHROSCOPY     Family History:  Family History  Problem Relation Age of Onset   Diabetes type II Mother    Diabetes Mother    Family Psychiatric  History:  No Known Diagnosis', Suicides, or Substance Abuse   Social History:  Social History   Substance and  Sexual Activity  Alcohol Use No   Comment: in rehab     Social History   Substance and Sexual Activity  Drug Use Not Currently   Types: Methamphetamines   Comment: was in rehab and relapsed after 22 months. 02/19/20    Social History   Socioeconomic History   Marital status: Single    Spouse name: Not on file   Number of children: Not on file   Years of education: Not on file   Highest education level: Not on file  Occupational History   Not on file  Tobacco Use   Smoking status: Every Day     Current packs/day: 0.25    Average packs/day: 0.3 packs/day for 23.9 years (6.0 ttl pk-yrs)    Types: Cigarettes    Start date: 2002   Smokeless tobacco: Never  Vaping Use   Vaping status: Every Day  Substance and Sexual Activity   Alcohol use: No    Comment: in rehab   Drug use: Not Currently    Types: Methamphetamines    Comment: was in rehab and relapsed after 22 months. 02/19/20   Sexual activity: Not Currently  Other Topics Concern   Not on file  Social History Narrative   Not on file   Social Drivers of Health   Financial Resource Strain: Not on file  Food Insecurity: No Food Insecurity (06/19/2024)   Hunger Vital Sign    Worried About Running Out of Food in the Last Year: Never true    Ran Out of Food in the Last Year: Never true  Transportation Needs: No Transportation Needs (06/19/2024)   PRAPARE - Administrator, Civil Service (Medical): No    Lack of Transportation (Non-Medical): No  Physical Activity: Not on file  Stress: Not on file  Social Connections: Not on file     Current Medications: Current Facility-Administered Medications  Medication Dose Route Frequency Provider Last Rate Last Admin   acetaminophen  (TYLENOL ) tablet 650 mg  650 mg Oral Q6H PRN Wilkie [name], FNP   650 mg at 06/19/24 1153   alum & mag hydroxide-simeth (MAALOX/MYLANTA) 200-200-20 MG/5ML suspension 30 mL  30 mL Oral Q4H PRN Starkes-Perry, [name], FNP       haloperidol  (HALDOL ) tablet 5 mg  5 mg Oral TID PRN Wilkie [name], FNP       And   diphenhydrAMINE  (BENADRYL ) capsule 50 mg  50 mg Oral TID PRN Starkes-Perry, [name], FNP       haloperidol  lactate (HALDOL ) injection 5 mg  5 mg Intramuscular TID PRN Starkes-Perry, [name], FNP       And   diphenhydrAMINE  (BENADRYL ) injection 50 mg  50 mg Intramuscular TID PRN Starkes-Perry, [name], FNP       And   LORazepam  (ATIVAN ) injection 2 mg  2 mg Intramuscular TID PRN Starkes-Perry, [name], FNP        haloperidol  lactate (HALDOL ) injection 10 mg  10 mg Intramuscular TID PRN Starkes-Perry, [name], FNP       And   diphenhydrAMINE  (BENADRYL ) injection 50 mg  50 mg Intramuscular TID PRN Wilkie [name], FNP       And   LORazepam  (ATIVAN ) injection 2 mg  2 mg Intramuscular TID PRN Wilkie [name], FNP       hydrOXYzine  (ATARAX ) tablet 25 mg  25 mg Oral TID PRN [name], [name], DO   25 mg at 06/22/24 2135   insulin  aspart (novoLOG ) injection  0-15 Units  0-15 Units Subcutaneous TID WC [name], [name], DO   5 Units at 06/23/24 9372   insulin  aspart (novoLOG ) injection 0-5 Units  0-5 Units Subcutaneous QHS [name], [name], DO   2 Units at 06/22/24 2149   losartan  (COZAAR ) tablet 50 mg  50 mg Oral Daily Starkes-Perry, [name], FNP   50 mg at 06/22/24 0745   magnesium  hydroxide (MILK OF MAGNESIA) suspension 30 mL  30 mL Oral Daily PRN Starkes-Perry, [name], FNP       sertraline  (ZOLOFT ) tablet 150 mg  150 mg Oral Daily [name], [name], DO       traZODone  (DESYREL ) tablet 100 mg  100 mg Oral QHS PRN Starkes-Perry, [name], FNP   100 mg at 06/22/24 2135    Lab Results:  Results for orders placed or performed during the hospital encounter of 06/19/24 (from the past 48 hours)  Glucose, capillary     Status: Abnormal   Collection Time: 06/21/24  2:39 PM  Result Value Ref Range   Glucose-Capillary 345 (H) 70 - 99 mg/dL    Comment: Glucose reference range applies only to samples taken after fasting for at least 8 hours.  Glucose, capillary     Status: Abnormal   Collection Time: 06/21/24  5:09 PM  Result Value Ref Range   Glucose-Capillary 256 (H) 70 - 99 mg/dL    Comment: Glucose reference range applies only to samples taken after fasting for at least 8 hours.  Glucose, capillary     Status: Abnormal   Collection Time: 06/21/24  8:50 PM  Result Value Ref Range   Glucose-Capillary 368 (H) 70 - 99 mg/dL    Comment: Glucose reference range applies only to samples  taken after fasting for at least 8 hours.  Glucose, capillary     Status: Abnormal   Collection Time: 06/22/24  6:18 AM  Result Value Ref Range   Glucose-Capillary 238 (H) 70 - 99 mg/dL    Comment: Glucose reference range applies only to samples taken after fasting for at least 8 hours.  Glucose, capillary     Status: Abnormal   Collection Time: 06/22/24 11:54 AM  Result Value Ref Range   Glucose-Capillary 298 (H) 70 - 99 mg/dL    Comment: Glucose reference range applies only to samples taken after fasting for at least 8 hours.  Glucose, capillary     Status: Abnormal   Collection Time: 06/22/24  5:12 PM  Result Value Ref Range   Glucose-Capillary 380 (H) 70 - 99 mg/dL    Comment: Glucose reference range applies only to samples taken after fasting for at least 8 hours.  Glucose, capillary     Status: Abnormal   Collection Time: 06/22/24  8:24 PM  Result Value Ref Range   Glucose-Capillary 223 (H) 70 - 99 mg/dL    Comment: Glucose reference range applies only to samples taken after fasting for at least 8 hours.  Glucose, capillary     Status: Abnormal   Collection Time: 06/23/24  6:11 AM  Result Value Ref Range   Glucose-Capillary 248 (H) 70 - 99 mg/dL    Comment: Glucose reference range applies only to samples taken after fasting for at least 8 hours.    Blood Alcohol level:  Lab Results  Component Value Date   [facility] <15 06/19/2024   [facility] <15 05/07/2024    Metabolic Disorder Labs: Lab Results  Component Value Date   HGBA1C 7.0 (H) 04/10/2024  MPG 154.2 04/10/2024   MPG 240.3 04/30/2023   No results found for: PROLACTIN Lab Results  Component Value Date   CHOL 289 (H) 04/14/2021   TRIG 891 (H) 04/14/2021   HDL 38 (L) 04/14/2021   CHOLHDL 7.6 04/14/2021   VLDL UNABLE TO CALCULATE IF TRIGLYCERIDE OVER 400 mg/dL 90/83/7977   LDLCALC UNABLE TO CALCULATE IF TRIGLYCERIDE OVER 400 mg/dL 90/83/7977   LDLCALC 864 (H) 06/29/2020    Physical Findings:  Mental Status  exam: Appearance: white male of short stature, fairly groomed, appears approximately stated age  Eye contact: good  Attitude towards examiner pleasant, cooperative  Psychomotor: no agitation or retardation  Speech: normal in rate, rhythm and prosody, soft  Language: no delays  Mood: good  Affect: congruent, neutral and appropriately reactive  Thought content: denying SI and HI, not delusions expressed  Thought Process: linear, organized and goal-directed  Perception: no AVH, not RTIS  Insight: good  Judgement: good - improved   Orientation: x4 Attention/Concentration: good - attends to interview  Memory/Cognition: grossly intact on conversation   Fund of Knowledge: Average    Musculoskeletal: Strength & Muscle Tone: within normal limits Gait & Station: normal Patient leans: N/A  Physical Exam Vitals and nursing note reviewed.  Constitutional:      General: He is not in acute distress.    Appearance: Normal appearance. He is normal weight. He is not ill-appearing or toxic-appearing.  HENT:     Head: Normocephalic and atraumatic.  Pulmonary:     Effort: Pulmonary effort is normal.  Musculoskeletal:        General: Normal range of motion.  Neurological:     General: No focal deficit present.     Mental Status: He is alert.    Review of Systems  Respiratory:  Negative for cough and shortness of breath.   Cardiovascular:  Negative for chest pain.  Gastrointestinal:  Negative for abdominal pain, constipation, diarrhea, nausea and vomiting.  Neurological:  Negative for dizziness, weakness and headaches.  Psychiatric/Behavioral:  Positive for depression (improving). Negative for hallucinations and suicidal ideas. The patient is not nervous/anxious.    Blood pressure (!) 139/94, pulse 96, temperature 97.7 F (36.5 C), temperature source Oral, resp. rate 20, height 5' 11 (1.803 m), weight 99.8 kg, SpO2 100%. Body mass index is 30.68 kg/m.   Treatment Plan  Summary: Daily contact with patient to assess and evaluate symptoms and progress in treatment and Medication management  Assessment/plan Jaystin Mcgarvey is a 43 yr old male who presented on 11/21 to [facility] after a Suicide Attempt via OD (900 mg Dextromethorphan) and worsening depression in the setting of not taking his medication (zoloft ) x 1 week. Hewas [facility] on 11/21 and zoloft  restarted at 50 mg. Patient has a history of recurrent depressive episodes characterized by low mood, anhedonia, reduced sleep, fatigue, and feelings of guilt/worthlessness. Has several past suicide attempts. Symptoms are confounded by history (per chart review) of borderline personality disorder as well as substance use. Most recently using meth and cough medicine. Prior to this admission the patient had not been admitted to psychiatry in about 10 years.    As of today, the patient's zoloft  has been increased back to 100 mg daily, which was his previous home dose. He has reported good current and prior benefit. He does have a history of BPD and likely stressors contributed to impulsive ingestion. He has been largely calm and denying SI since admission. Will monitor overnight given recent SA and if  there are no concerns will likely go home tomorrow. Patient's blood sugars have been in the 300s-400s. Will schedule 5 mg TID with meals of novolog  and 10 U of glargine for tonight. He will need close follow up with PCP.     MDD, Recurrent, Severe, w/out Psychyosis  Anxiety: -Continue Zoloft  100 mg daily for depression and anxiety -Continue Agitation Protocol: Haldol /Ativan /Benadryl      HTN: -Continue Losartan  50 mg daily     Diabetes: -Continue Sliding Scale -Schedule novolog  5 mg TID with meals -Schedule glargine 10 U at bedtime  -Continue weekly injections at home (Fridays)     -Continue PRN's: Tylenol , Maalox, Atarax , Milk of Magnesia, Trazodone    --  The risks/benefits/side-effects/alternatives to  medications were discussed in detail with the patient and time was given for questions. The patient consents to medication trials.               -- Encouraged patient to participate in unit milieu and in scheduled group therapies              -- Short Term Goals: Ability to identify changes in lifestyle to reduce recurrence of condition will improve, Ability to verbalize feelings will improve, Ability to disclose and discuss suicidal ideas, Ability to demonstrate self-control will improve, Ability to identify and develop effective coping behaviors will improve, Ability to maintain clinical measurements within normal limits will improve, Compliance with prescribed medications will improve, and Ability to identify triggers associated with substance abuse/mental health issues will improve             -- Long Term Goals: Improvement in symptoms so as ready for discharge   Safety and Monitoring:             -- Voluntary admission to inpatient psychiatric unit for safety, stabilization and treatment             -- Daily contact with patient to assess and evaluate symptoms and progress in treatment             -- Patient's case to be discussed in multi-disciplinary team meeting             -- Observation Level : q15 minute checks             -- Vital signs:  q12 hours             -- Precautions: suicide, elopement, and assault  Discharge Planning:              -- Social work and case management to assist with discharge planning and identification of hospital follow-up needs prior to discharge             -- Estimated LOS: 2-4 more days             -- Discharge Concerns: Need to establish a safety plan; Medication compliance and effectiveness             -- Discharge Goals: Return home with outpatient referrals for mental health follow-up including medication management/psychotherapy  Leita LOISE Arts, MD 06/23/2024, 8:46 AM

## 2024-06-24 LAB — GLUCOSE, CAPILLARY: Glucose-Capillary: 327 mg/dL — ABNORMAL HIGH (ref 70–99)

## 2024-06-24 MED ORDER — SERTRALINE HCL 50 MG PO TABS
150.0000 mg | ORAL_TABLET | Freq: Every day | ORAL | 0 refills | Status: AC
Start: 2024-06-24 — End: 2024-07-24

## 2024-06-24 MED ORDER — TRAZODONE HCL 100 MG PO TABS
100.0000 mg | ORAL_TABLET | Freq: Every evening | ORAL | 0 refills | Status: AC | PRN
Start: 2024-06-24 — End: 2024-07-24

## 2024-06-24 MED ORDER — HYDROXYZINE HCL 25 MG PO TABS: 25.0000 mg | ORAL_TABLET | Freq: Three times a day (TID) | ORAL | 0 refills | Status: AC | PRN

## 2024-06-24 NOTE — Inpatient Diabetes Management (Signed)
 Inpatient Diabetes Program Recommendations  AACE/ADA: New Consensus Statement on Inpatient Glycemic Control (2015)  Target Ranges:  Prepandial:   less than 140 mg/dL      Peak postprandial:   less than 180 mg/dL (1-2 hours)      Critically ill patients:  140 - 180 mg/dL   Lab Results  Component Value Date   GLUCAP 327 (H) 06/24/2024   HGBA1C 7.0 (H) 04/10/2024    Review of Glycemic Control  Latest Reference Range & Units 06/23/24 06:11 06/23/24 12:03 06/23/24 12:09 06/23/24 17:02 06/23/24 21:01 06/24/24 05:35  Glucose-Capillary 70 - 99 mg/dL 751 (H) 529 (H) 570 (H) 335 (H) 323 (H) 327 (H)   Diabetes history: DM 2 Outpatient Diabetes medications: Mounjaro  7.5 mg QFriday Current orders for Inpatient glycemic control:  Novolog  0-15 units tid + hs Semglee  10 units qhs Novolog  5 units tid meal coverage A1c 7% on 9/12  Inpatient Diabetes Program Recommendations:    -   Increase Semglee  to 20 units  Thanks, Clotilda Bull RN, MSN, BC-ADM Inpatient Diabetes Coordinator Team Pager [phone] (8a-5p)

## 2024-06-24 NOTE — Plan of Care (Signed)
  Problem: Education: Goal: Emotional status will improve Outcome: Adequate for Discharge   Problem: Education: Goal: Verbalization of understanding the information provided will improve Outcome: Adequate for Discharge   Problem: Activity: Goal: Interest or engagement in activities will improve Outcome: Adequate for Discharge

## 2024-06-24 NOTE — Progress Notes (Signed)
(  Sleep Hours) - 7.75  (Any PRNs that were needed, meds refused, or side effects to meds)- Vistaril  for anxiety and Trazodone  for sleep  (Any disturbances and when (visitation, over night)- none  (Concerns raised by the patient)- none  (SI/HI/AVH)- Denies

## 2024-06-24 NOTE — Progress Notes (Signed)
  [facility] Adult Case Management Discharge Plan :  Will you be returning to the same living situation after discharge:  Yes,  Pt will return to campus. At discharge, do you have transportation home?: Yes,  Pt's father will pick up at 10 am. Do you have the ability to pay for your medications: Yes,  Pt has medical insurance.  Release of information consent forms completed and in the chart;  Patient's signature needed at discharge.  Patient to Follow up at:  Follow-up Information     [facility] Follow up.   Why: You may call this provider to schedule an appointment for therapy services. Contact information: [address] [place], [place] [address] Phone: [phone]        [facility]. Go on 06/30/2024.   Why: You have a return to campus meeting scheduled on 06/30/24 at 2:00 pm  , to obtain counseling and medication management services and/or referrals. Contact information: [employer], 2nd floor [place], [place]  [place]:  [phone]                Next level of care provider has access to [facility] Link:no  Safety Planning and Suicide Prevention discussed: Yes,  Completed with Darrie Macmillan (father) [phone]      Has patient been referred to the Quitline?: Patient refused referral for treatment  Patient has been referred for addiction treatment: Patient refused referral for treatment. Pt not interested in treatment as this time.  Derick JONELLE Blanch, LCSW 06/24/2024, 8:39 AM

## 2024-06-24 NOTE — Discharge Instructions (Addendum)
 Your medications have been sent to the [facility] pharmacy on [address]   Novolog  and glargine that were given in the hospital have been stopped; please resume outpatient management and recommend following up quickly with PCP and endocrinology

## 2024-06-24 NOTE — BHH Suicide Risk Assessment (Signed)
 [facility] Discharge Suicide Risk Assessment   Principal Problem: MDD (major depressive disorder), recurrent severe, without psychosis (HCC) Discharge Diagnoses: Principal Problem:   MDD (major depressive disorder), recurrent severe, without psychosis (HCC)   Suicide risk: The patient presented with acute risk factors of depression and SI in the setting of psychosocial stressors, substance use, and stopping medication. He carries additional chronic risk factors of male gender, history of prior admissions, history of suicide attempts and self-harming behaviors. He was admitted voluntarily to address acute risk factors and restarted on previous medications. During hospital stay he had improvements in mood, energy, sleep and resolution of SI. Today was euthymic and engaged in safety planning discussion, expressing clear future orientation - looking forward to getting back to school and work, seeing some family over the holidays. Additional protective factors include support from family/community (roommates, dad), employed, domiciled, source of income, full-time consulting civil engineer, engagement in both therapy and medication management. Although patient is chronically elevated in comparison to the general population based on non-modifiable risk factors as documented, current and short-term risk of suicide is considered low. The most appropriate and least restrictive environment is outpatient follow up as documented below. Medications have been sent to the [facility] pharmacy on [address] by patient request.    Follow-up Information     [facility] Follow up.   Why: You may call this provider to schedule an appointment for therapy services. Contact information: [address] [place], [place] [address] Phone: [phone]        [facility]. Go on 06/30/2024.   Why: You have a return to campus meeting scheduled on 06/30/24 at 2:00 pm  , to obtain counseling and medication management  services and/or referrals. Contact information: [facility], 2nd floor [place], [place]  [place]:  [phone]                Leita LOISE Arts, MD 06/24/2024, 8:32 AM

## 2024-06-24 NOTE — Progress Notes (Signed)
 D/c Instructions-Education: Jason Wolf, Discharge instructions given to patient with verbalization of understanding. D/c education completed with patient including follow up instructions, medication list, d/c activities limitations if indicated, with other d/c instructions as indicated by MD - patient able to verbalize understanding, all questions fully answered. Patient denied SI/HI/AVH. Suicide prevention information given and discussed with patient and understanding of D/C information verbalized by patient. All questions asked were answered by staff, patient stated they have no questions at time of D/C. Patient received all belongings brought to the unit at discharge. Patient showed appreciation of care given to him at the [facility] unit. Patient was escorted to the lobby and left for home in private auto at 1000.    Joaquin MALVA Doing, RN

## 2024-06-24 NOTE — Discharge Summary (Signed)
 Physician Discharge Summary Note  Patient:  Jason Wolf is an 43 y.o., male MRN:  [id number] DOB:  Nov 05, 1980 Patient phone:  [phone] (home)  Patient address:   [address] [place] [address],   Total Time spent with patient: 35 minutes   Date of Admission:  06/19/2024 Date of Discharge: 06/24/24  Reason for Admission:  SI, suicidal gesture   Principal Problem: MDD (major depressive disorder), recurrent severe, without psychosis (HCC) Discharge Diagnoses: Principal Problem:   MDD (major depressive disorder), recurrent severe, without psychosis (HCC)   Identifying Information and Psychiatric history: Jason Wolf is a 43 yr old male with a psychiatric history most consistent with MDD without psychosis who was admitted voluntarily to [facility] with increased depression, SI and SA in the context of psychosocial stressors   Patient's psychiatric history is significant for prior diagnoses of Depression, Anxiety, and substance use concerns: stimulants (hx of meth) and hallucinogens (Cough Meds). In addition , he has carried a diagnosis of Borderline Personality Disorder in the past for which he has been through DBT. No history of mania or psychosis on screening or per history. The patient has a history of 5 Suicide Attempts (last OD via cough medicines 2023), Self Injurious Behavior (cutting), and 4 Prior Psychiatric Hospitalizations (last 10 yrs ago [place]). More recently he has been largely stabilized on an outpatient basis on zoloft  + therapy alone. Prior to admission had used substances and stopped medication for 1 week due to increased stressors with family and work.    Hospital Course: The patient presented on 11/21 to [facility] after a Suicidal gesture via OD (900 mg Dextromethorphan) and worsening depression in the setting of psychosocial stressors (job stress at [employer], family stressor) and stopping zoloft  x1 week. He was monitored in the ED but quickly medically cleared and  voluntarily admitted to [facility] on 11/21.  Upon arrival, patient was verbalizing numerous stressors related to school, family (who he felt did not want him around to put up christmas lights), job stress. Voiced the overdose as an attempt to numb self rather than truly wanting to die.  He was no longer having active suicidal thoughts, plan or intent and overall voiced good benefit with home zoloft  and wanted to restart this medication. Endorsing understanding that stopping the medicine likely contributed to worsening depression. Zoloft  was restarted and increased to a total of 150 mg during this admission. In addition, to address poor sleep PRN trazodone  was increased to 100 mg. Over the next several days patient began to have improvements in sleep, mood, appetite, and energy levels. Remained largely isolative to room, but did come out on last 1-2 days of admission and participate in some groups.  As of 11/25, patient was largely euthymic, endorsing improvements in mood with no significant active depression and expressing clear future orientation. Encouraged to go out and do groups. Due to high sugars, Novolog  5 U TID and glargine 10 U nightly was scheduled in addition to SSI. Patient was advised that this would be discontinued at discharge and to continue outpatient management as directed by PCP (they were in the middle of changing medications) and he voiced understanding and intent to do so.  Interview today: Today the patient reports he is good. States that he did go and do some groups yesterday and was provided a sheet that he used to fill out every day as part of his DBT. States this is a sign that he needs to go back to doing some of these worksheets in  addition to medication and therapy. Overall he does feel much better in terms of mood and has slept well with the increased dose of trazodone . He plans on continuing his medication as prescribed and leaning on his roommates - who were very worried about  him - and family. He is feeling better about getting back to work now that he is no longer at [employer], where he was having the most stress. His current job is stocking shelves, and it is a very calm environment. He plans to go back to work this evening and use the holiday break to catch up on some schoolwork. He is looking forward to getting back into his routine. Overall denying SI, HI and AVH, reporting good sleep, good energy and no concerns today.   Behavior on unit/overall: Patient was admitted following suicidal gesture - OD on low amount of cough medicine to numb self. Initially endorsing low mood symptoms but was cooperative with intake process and all aspects of care - expressing desire to restart home medications that had been beneficial. Once zoloft  was restarted and with aid of trazodone , the patient quickly demonstrated improvements in mood, sleep, energy. He denied SI throughout entirety of admission. At no time did he present with behavioral concerns and he did not require any PRNs for behaviors. Although cooperative with staff, he was largely isolative to room and did not participate in most groups during his admission. He did come out for some groups in the last 1-2 days and appeared to be engaging more, specifically with DBT worksheets. Today patient was euthymic, engaged in safety planning, future oriented and voicing intent to continue with medications, therapy, med management and DBT worksheets. He was deemed low risk of suicide (see SRA) and ultimately discharged home in stable and improved condition with medications sent to preferred pharmacy and follow up as documented below. All questions answered. Recommended calling 911, [phone] or presenting to the ED should suicidal thoughts recur and he voiced intent to do so.     Past Medical History:  Past Medical History:  Diagnosis Date   Bipolar disorder (HCC)    DM (diabetes mellitus) (HCC)    ETOH abuse    Hepatitis C     Hypertension    Opiate abuse, episodic (HCC)     Past Surgical History:  Procedure Laterality Date   I & D EXTREMITY Right 02/20/2020   Procedure: IRRIGATION AND DEBRIDEMENT HAND;  Surgeon: Carolee Lynwood JINNY DOUGLAS, MD;  Location: [facility] OR;  Service: Orthopedics;  Laterality: Right;   KNEE ARTHROSCOPY     Family History:  Family History  Problem Relation Age of Onset   Diabetes type II Mother    Diabetes Mother    Social History:  Social History   Substance and Sexual Activity  Alcohol Use No   Comment: in rehab     Social History   Substance and Sexual Activity  Drug Use Not Currently   Types: Methamphetamines   Comment: was in rehab and relapsed after 22 months. 02/19/20    Social History   Socioeconomic History   Marital status: Single    Spouse name: Not on file   Number of children: Not on file   Years of education: Not on file   Highest education level: Not on file  Occupational History   Not on file  Tobacco Use   Smoking status: Every Day    Current packs/day: 0.25    Average packs/day: 0.3 packs/day for 23.9 years (6.0 ttl pk-yrs)  Types: Cigarettes    Start date: 2002   Smokeless tobacco: Never  Vaping Use   Vaping status: Every Day  Substance and Sexual Activity   Alcohol use: No    Comment: in rehab   Drug use: Not Currently    Types: Methamphetamines    Comment: was in rehab and relapsed after 22 months. 02/19/20   Sexual activity: Not Currently  Other Topics Concern   Not on file  Social History Narrative   Not on file   Social Drivers of Health   Financial Resource Strain: Not on file  Food Insecurity: No Food Insecurity (06/19/2024)   Hunger Vital Sign    Worried About Running Out of Food in the Last Year: Never true    Ran Out of Food in the Last Year: Never true  Transportation Needs: No Transportation Needs (06/19/2024)   PRAPARE - Administrator, Civil Service (Medical): No    Lack of Transportation (Non-Medical): No   Physical Activity: Not on file  Stress: Not on file  Social Connections: Not on file    Physical findings:   Mental Status exam: Appearance: white male of short stature, fairly groomed, appears approximately stated age  Eye contact: good  Attitude towards examiner pleasant, cooperative  Psychomotor: no agitation or retardation  Speech: normal in rate, rhythm and prosody, soft  Language: no delays  Mood: good  Affect: congruent, euthymic and appropriately reactive  Thought content: denying SI and HI, no delusions expressed  Thought Process: linear, organized and goal-directed  Perception: no AVH, not RTIS  Insight: good  Judgement: good - improved    Orientation: x4 Attention/Concentration: good - attends to interview  Memory/Cognition: grossly intact on conversation   Fund of Knowledge: Average      Musculoskeletal: Strength & Muscle Tone: within normal limits Gait & Station: normal Patient leans: N/A  Physical Exam Constitutional:      Appearance: Normal appearance.  HENT:     Head: Normocephalic and atraumatic.  Pulmonary:     Effort: Pulmonary effort is normal.  Abdominal:     General: There is no distension.  Musculoskeletal:        General: Normal range of motion.  Neurological:     General: No focal deficit present.     Mental Status: He is alert.    ROS Blood pressure (!) 127/91, pulse (!) 106, temperature 98.2 F (36.8 C), temperature source Oral, resp. rate 18, height 5' 11 (1.803 m), weight 99.8 kg, SpO2 100%. Body mass index is 30.68 kg/m.   Social History   Tobacco Use  Smoking Status Every Day   Current packs/day: 0.25   Average packs/day: 0.3 packs/day for 23.9 years (6.0 ttl pk-yrs)   Types: Cigarettes   Start date: 2002  Smokeless Tobacco Never   Tobacco Cessation:  N/A, patient does not currently use tobacco products   Blood Alcohol level:  Lab Results  Component Value Date   [facility] <15 06/19/2024   ETH <15 05/07/2024     Metabolic Disorder Labs:  Lab Results  Component Value Date   HGBA1C 7.0 (H) 04/10/2024   MPG 154.2 04/10/2024   MPG 240.3 04/30/2023   No results found for: PROLACTIN Lab Results  Component Value Date   CHOL 289 (H) 04/14/2021   TRIG 891 (H) 04/14/2021   HDL 38 (L) 04/14/2021   CHOLHDL 7.6 04/14/2021   VLDL UNABLE TO CALCULATE IF TRIGLYCERIDE OVER 400 mg/dL 90/83/7977   LDLCALC UNABLE TO CALCULATE IF  TRIGLYCERIDE OVER 400 mg/dL 90/83/7977   LDLCALC 864 (H) 06/29/2020    See Psychiatric Specialty Exam and Suicide Risk Assessment completed by Attending Physician prior to discharge.  Discharge destination:  Back to dorms and [facility], being picked up by dad  Is patient on multiple antipsychotic therapies at discharge:  No      Allergies as of 06/24/2024       Reactions   Bee Venom Anaphylaxis   Wasp Venom Anaphylaxis   Yellow Jacket Venom Anaphylaxis   Codeine Nausea And Vomiting, Other (See Comments)   headaches        Medication List     STOP taking these medications    hydrOXYzine  25 MG capsule Commonly known as: VISTARIL    Mavyret  100-40 MG Tabs Generic drug: Glecaprevir -Pibrentasvir    meloxicam 15 MG tablet Commonly known as: MOBIC       TAKE these medications      Indication  FreeStyle Libre 3 Sensor Misc Change sensor every 14 days.  Indication: sugar monitoring   glucose monitoring kit monitoring kit 1 each by Does not apply route 4 (four) times daily - after meals and at bedtime. 1 month Diabetic Testing Supplies for QAC-QHS accuchecks.  Indication: sugar monitoring   hydrOXYzine  25 MG tablet Commonly known as: ATARAX  Take 1 tablet (25 mg total) by mouth 3 (three) times daily as needed for anxiety.  Indication: Feeling Anxious   losartan  50 MG tablet Commonly known as: COZAAR  Take 50 mg by mouth daily.  Indication: High Blood Pressure   Mounjaro  7.5 MG/0.5ML Pen Generic drug: tirzepatide  Inject 7.5 mg into the skin every  Friday.  Indication: Type 2 Diabetes   Pen Needles 32G X 4 MM Misc Use as instructed. Inject into the skin 5 times per day  Indication: As directed by PCP   sertraline  50 MG tablet Commonly known as: ZOLOFT  Take 3 tablets (150 mg total) by mouth daily. What changed: medication strength  Indication: Major Depressive Disorder   traZODone  100 MG tablet Commonly known as: DESYREL  Take 1 tablet (100 mg total) by mouth at bedtime as needed for sleep. What changed:  medication strength how much to take when to take this reasons to take this  Indication: Trouble Sleeping        Follow-up Information     [facility] Follow up.   Why: You may call this provider to schedule an appointment for therapy services. Contact information: [address], [place], [place] [address] Phone: [phone]        [facility]. Go on 06/30/2024.   Why: You have a return to campus meeting scheduled on 06/30/24 at 2:00 pm  , to obtain counseling and medication management services and/or referrals. Contact information: [facility], 2nd floor [place], [place]  [place]:  [phone]                Signed: Leita LOISE Arts, MD 06/24/2024, 8:36 AM

## 2024-07-03 ENCOUNTER — Other Ambulatory Visit: Payer: Self-pay | Admitting: Pharmacist

## 2024-07-03 DIAGNOSIS — F411 Generalized anxiety disorder: Secondary | ICD-10-CM | POA: Diagnosis not present

## 2024-07-03 NOTE — Progress Notes (Signed)
 Specialty Pharmacy Ongoing Clinical Assessment Note  Jason Wolf is a 43 y.o. male who is being followed by the specialty pharmacy service for RxSp Hepatitis C   Patient's specialty medication(s) reviewed today: Glecaprevir -Pibrentasvir  (Mavyret )   Missed doses in the last 4 weeks: 0   Patient/Caregiver did not have any additional questions or concerns.   Therapeutic benefit summary: Patient is achieving benefit   Adverse events/side effects summary: No adverse events/side effects   Patient's therapy is appropriate to: Discontinue    Goals Addressed             This Visit's Progress    COMPLETED: Achieve virologic cure as evidenced by SVR       Patient is initiating therapy. Patient will be evaluated at upcoming provider appointment to assess progress      COMPLETED: Comply with lab assessments       Patient is on track. Patient will adhere to provider and/or lab appointments      COMPLETED: Maintain optimal adherence to therapy       Patient is initiating therapy. Patient will work on increased adherence and be evaluated at upcoming provider appointment to assess progress         Follow up: none required - therapy complete  Alan JINNY Geralds Specialty Pharmacist

## 2024-07-17 DIAGNOSIS — F331 Major depressive disorder, recurrent, moderate: Secondary | ICD-10-CM | POA: Diagnosis not present

## 2024-07-29 DIAGNOSIS — F603 Borderline personality disorder: Secondary | ICD-10-CM | POA: Diagnosis not present
